# Patient Record
Sex: Male | Born: 1990 | Race: White | Hispanic: No | Marital: Married | Smoking: Former smoker
Health system: Southern US, Community
[De-identification: ages and names within clinical notes are randomized; demographics above are authoritative.]

## PROBLEM LIST (undated history)

## (undated) ENCOUNTER — Emergency Department (HOSPITAL_COMMUNITY): Payer: Commercial Managed Care - PPO | Source: Home / Self Care

## (undated) DIAGNOSIS — Z8719 Personal history of other diseases of the digestive system: Secondary | ICD-10-CM

## (undated) DIAGNOSIS — K219 Gastro-esophageal reflux disease without esophagitis: Secondary | ICD-10-CM

## (undated) DIAGNOSIS — F909 Attention-deficit hyperactivity disorder, unspecified type: Secondary | ICD-10-CM

## (undated) HISTORY — PX: MOUTH SURGERY: SHX715

## (undated) HISTORY — PX: WISDOM TOOTH EXTRACTION: SHX21

---

## 2011-03-07 ENCOUNTER — Ambulatory Visit (INDEPENDENT_AMBULATORY_CARE_PROVIDER_SITE_OTHER): Payer: BC Managed Care – PPO

## 2011-03-07 DIAGNOSIS — J039 Acute tonsillitis, unspecified: Secondary | ICD-10-CM

## 2012-03-11 ENCOUNTER — Telehealth: Payer: Self-pay

## 2012-03-11 NOTE — Telephone Encounter (Signed)
PT'S MOTHER STATES THAT PT IS EXPERIENCING VOMITING AND DIARREAH, PT'S MOTHER WOULD LIKE SOMETHING CALLED IN FOR THESE SYMPTOMS ALTHOUGH HE HASN'T BEEN HERE IN MONTHS.  BEST# [phone] PHARMACY:[facility]

## 2012-03-12 NOTE — Telephone Encounter (Signed)
I am sorry but we can not do this without office visit. Have called mother to advise. Left message.

## 2013-03-18 ENCOUNTER — Ambulatory Visit: Payer: BC Managed Care – PPO | Admitting: Emergency Medicine

## 2013-03-18 VITALS — BP 130/90 | HR 80 | Temp 98.9°F | Resp 18 | Ht 69.0 in | Wt 177.0 lb

## 2013-03-18 DIAGNOSIS — A088 Other specified intestinal infections: Secondary | ICD-10-CM

## 2013-03-18 MED ORDER — ONDANSETRON 8 MG PO TBDP: 8.0000 mg | ORAL_TABLET | Freq: Three times a day (TID) | ORAL | Status: DC | PRN

## 2013-03-18 MED ORDER — LOPERAMIDE HCL 2 MG PO TABS: ORAL_TABLET | ORAL | Status: DC

## 2013-03-18 NOTE — Patient Instructions (Signed)
Clear Liquid Diet The clear liquid diet consists of foods that are liquid or will become liquid at room temperature. Examples of foods allowed on a clear liquid diet include fruit juice, broth or bouillon, gelatin, or frozen ice pops. You should be able to see through the liquid. The purpose of this diet is to provide the necessary fluids, electrolytes (such as sodium and potassium), and energy to keep the body functioning during times when you are not able to consume a regular diet. A clear liquid diet should not be continued for long periods of time, as it is not nutritionally adequate.  A CLEAR LIQUID DIET MAY BE NEEDED:  When a sudden-onset (acute) condition occurs before or after surgery.   As the first step in oral feeding.   For fluid and electrolyte replacement in diarrheal diseases.   As a diet before certain medical tests are performed.  ADEQUACY The clear liquid diet is adequate only in ascorbic acid, according to the Recommended Dietary Allowances of the National Research Council.  CHOOSING FOODS Breads and Starches  Allowed: None are allowed.   Avoid: All are to be avoided.  Vegetables  Allowed: Strained vegetable juices.   Avoid: Any others.  Fruit  Allowed: Strained fruit juices and fruit drinks. Include 1 serving of citrus or vitamin C-enriched fruit juice daily.   Avoid: Any others.  Meat and Meat Substitutes  Allowed: None are allowed.   Avoid: All are to be avoided.  Milk Products  Allowed: None are allowed.   Avoid: All are to be avoided.  Soups and Combination Foods  Allowed: Clear bouillon, broth, or strained broth-based soups.   Avoid: Any others.  Desserts and Sweets  Allowed: Sugar, honey. High-protein gelatin. Flavored gelatin, ices, or frozen ice pops that do not contain milk.   Avoid: Any others.  Fats and Oils  Allowed: None are allowed.   Avoid: All are to be avoided.   Beverages  Allowed: Cereal beverages, coffee (regular or decaffeinated), tea, or soda at the discretion of your health care provider.   Avoid: Any others.  Condiments  Allowed: Salt.   Avoid: Any others, including pepper.  Supplements  Allowed: Liquid nutrition beverages that you can see through.   Avoid: Any others that contain lactose or fiber. SAMPLE MEAL PLAN Breakfast  4 oz (120 mL) strained orange juice.   to 1 cup (120 to 240 mL) gelatin (plain or fortified).  1 cup (240 mL) beverage (coffee or tea).  Sugar, if desired. Midmorning Snack   cup (120 mL) gelatin (plain or fortified). Lunch  1 cup (240 mL) broth or consomm.  4 oz (120 mL) strained grapefruit juice.   cup (120 mL) gelatin (plain or fortified).  1 cup (240 mL) beverage (coffee or tea).  Sugar, if desired. Midafternoon Snack   cup (120 mL) fruit ice.   cup (120 mL) strained fruit juice. Dinner  1 cup (240 mL) broth or consomm.   cup (120 mL) cranberry juice.   cup (120 mL) flavored gelatin (plain or fortified).  1 cup (240 mL) beverage (coffee or tea).  Sugar, if desired. Evening Snack  4 oz (120 mL) strained apple juice (vitamin C-fortified).   cup (120 mL) flavored gelatin (plain or fortified). MAKE SURE YOU:  Understand these instructions.  Will watch your child's condition.  Will get help right away if your child is not doing well or gets worse. Document Released: 03/03/2005 Document Revised: 11/03/2012 Document Reviewed: 08/03/2012 ExitCare Patient Information 2014 ExitCare,   LLC. Viral Gastroenteritis Viral gastroenteritis is also known as stomach flu. This condition affects the stomach and intestinal tract. It can cause sudden diarrhea and vomiting. The illness typically lasts 3 to 8 days. Most people develop an immune response that eventually gets rid of the virus. While this natural response develops, the virus can make you quite ill. CAUSES   Many different viruses can cause gastroenteritis, such as rotavirus or noroviruses. You can catch one of these viruses by consuming contaminated food or water. You may also catch a virus by sharing utensils or other personal items with an infected person or by touching a contaminated surface. SYMPTOMS  The most common symptoms are diarrhea and vomiting. These problems can cause a severe loss of body fluids (dehydration) and a body salt (electrolyte) imbalance. Other symptoms may include:  Fever.  Headache.  Fatigue.  Abdominal pain. DIAGNOSIS  Your caregiver can usually diagnose viral gastroenteritis based on your symptoms and a physical exam. A stool sample may also be taken to test for the presence of viruses or other infections. TREATMENT  This illness typically goes away on its own. Treatments are aimed at rehydration. The most serious cases of viral gastroenteritis involve vomiting so severely that you are not able to keep fluids down. In these cases, fluids must be given through an intravenous line (IV). HOME CARE INSTRUCTIONS   Drink enough fluids to keep your urine clear or pale yellow. Drink small amounts of fluids frequently and increase the amounts as tolerated.  Ask your caregiver for specific rehydration instructions.  Avoid:  Foods high in sugar.  Alcohol.  Carbonated drinks.  Tobacco.  Juice.  Caffeine drinks.  Extremely hot or cold fluids.  Fatty, greasy foods.  Too much intake of anything at one time.  Dairy products until 24 to 48 hours after diarrhea stops.  You may consume probiotics. Probiotics are active cultures of beneficial bacteria. They may lessen the amount and number of diarrheal stools in adults. Probiotics can be found in yogurt with active cultures and in supplements.  Wash your hands well to avoid spreading the virus.  Only take over-the-counter or prescription medicines for pain, discomfort, or fever as directed by your caregiver. Do  not give aspirin to children. Antidiarrheal medicines are not recommended.  Ask your caregiver if you should continue to take your regular prescribed and over-the-counter medicines.  Keep all follow-up appointments as directed by your caregiver. SEEK IMMEDIATE MEDICAL CARE IF:   You are unable to keep fluids down.  You do not urinate at least once every 6 to 8 hours.  You develop shortness of breath.  You notice blood in your stool or vomit. This may look like coffee grounds.  You have abdominal pain that increases or is concentrated in one small area (localized).  You have persistent vomiting or diarrhea.  You have a fever.  The patient is a child younger than 3 months, and he or she has a fever.  The patient is a child older than 3 months, and he or she has a fever and persistent symptoms.  The patient is a child older than 3 months, and he or she has a fever and symptoms suddenly get worse.  The patient is a baby, and he or she has no tears when crying. MAKE SURE YOU:   Understand these instructions.  Will watch your condition.  Will get help right away if you are not doing well or get worse. Document Released: 03/03/2005 Document Revised: 05/26/2011 Document   Reviewed: 12/18/2010 ExitCare Patient Information 2014 ExitCare, LLC.  

## 2013-03-18 NOTE — Progress Notes (Signed)
[facility] [address], [place] [id number] [phone]  Date:  03/18/2013   Name:  Matthew Pace   DOB:  [birth year]   MRN:  [id number]  PCP:  No primary provider on file.    Chief Complaint: Diarrhea, Fatigue and Fever   History of Present Illness:  Matthew Nianndrew Herbel is a 23 y.o. very pleasant male patient who presents with the following:  Three day history of chills, nausea and vomiting, and diarrhea.  Seems to improve today.  Missed one day of work.  Taking liquids.  No food. No food intolerance.  The patient has no complaint of blood, mucous, or pus in her stools. No rash.  No fever or chills.  No ill contacts.  No improvement with over the counter medications or other home remedies. Denies other complaint or health concern today.   There are no active problems to display for this patient.   History reviewed. No pertinent past medical history.  History reviewed. No pertinent past surgical history.  History  Substance Use Topics  . Smoking status: Former Games developermoker  . Smokeless tobacco: Not on file  . Alcohol Use: Not on file    History reviewed. No pertinent family history.  No Known Allergies  Medication list has been reviewed and updated.  No current outpatient prescriptions on file prior to visit.   No current facility-administered medications on file prior to visit.    Review of Systems:  As per HPI, otherwise negative.    Physical Examination: Filed Vitals:   03/18/13 1537  BP: 130/90  Pulse: 80  Temp: 98.9 F (37.2 C)  Resp: 18   Filed Vitals:   03/18/13 1537  Height: 5\' 9"  (1.753 m)  Weight: 177 lb (80.287 kg)   Body mass index is 26.13 kg/(m^2). Ideal Body Weight: Weight in (lb) to have BMI = 25: 168.9  GEN: WDWN, NAD, Non-toxic, A & O x 3.  dry HEENT: Atraumatic, Normocephalic. Neck supple. No masses, No LAD. Ears and Nose: No external deformity. CV: RRR, No M/G/R. No JVD. No thrill. No extra heart sounds. PULM: CTA  B, no wheezes, crackles, rhonchi. No retractions. No resp. distress. No accessory muscle use. ABD: S, NT, ND, +BS. No rebound. No HSM. EXTR: No c/c/e NEURO Normal gait.  PSYCH: Normally interactive. Conversant. Not depressed or anxious appearing.  Calm demeanor.    Assessment and Plan: Gastroenteritis Imodium zofran  Signed,  Phillips OdorJeffery Loma [place], MD

## 2013-03-19 MED ORDER — ONDANSETRON HCL 8 MG PO TABS: 8.0000 mg | ORAL_TABLET | Freq: Three times a day (TID) | ORAL | Status: DC | PRN

## 2013-03-19 NOTE — Addendum Note (Signed)
Addended by: Thelma BargeICHARDSON, Sherilynn Dieu D on: 03/19/2013 02:21 PM   Modules accepted: Orders, Medications

## 2014-10-30 ENCOUNTER — Telehealth: Payer: Self-pay

## 2014-10-30 ENCOUNTER — Ambulatory Visit (INDEPENDENT_AMBULATORY_CARE_PROVIDER_SITE_OTHER): Payer: Worker's Compensation | Admitting: Family Medicine

## 2014-10-30 VITALS — Ht 69.0 in | Wt 187.5 lb

## 2014-10-30 DIAGNOSIS — M79645 Pain in left finger(s): Secondary | ICD-10-CM | POA: Diagnosis not present

## 2014-10-30 DIAGNOSIS — S61219A Laceration without foreign body of unspecified finger without damage to nail, initial encounter: Secondary | ICD-10-CM

## 2014-10-30 NOTE — Progress Notes (Signed)
Procedure: verbal consent obtained. Area anesthetized with 2.5 mL 1% lidocaine. Drape placed and wound irrigated. 5.0 ethylon used to place 3 SI sutures. Dressing applied. Discussed proper wound care with pt. Pt to FU in 10 days for suture removal.

## 2014-10-30 NOTE — Progress Notes (Signed)
   Subjective:    Patient ID: Matthew Pace, male    DOB: 13-Dec-1990, 24 y.o.   MRN: [id number]  HPI  Cut on 2nd left finger after working on engine earlier this evening around 5 pm Last tetanus shot this past November No numbness or tingling Washed out thoroughly with water no soap Did put hand sanitizer and neosporin on the cut Never hurt this finger before Able to bend and extend finger Able to stop bleeding No loss of sensation Denies numbness or tingling  No other associated sx's   Review of Systems    all other pertinent ROS negative except as seen in HPI Objective:   Physical Exam GEN: WDWN, NAD, Non-toxic, A & O x 3 CV: RRR, No M/G/R. No JVD. No thrill. No extra heart sounds. PULM: CTA B, no wheezes, crackles, rhonchi. No retractions. No resp. distress. No accessory muscle use. ABD: S, NT, ND, +BS. No rebound. No HSM. EXTR: No c/c/e, 2 cm laceration over PIP joint of right hand with no extensor tendon involvement, able to fully flex and extend finger , nml sensation NEURO Normal gait.  PSYCH: Normally interactive. Conversant. Not depressed or anxious appearing.  Calm demeanor.    Discussed risks and benefits of procedure and pt agreed to moving forward with suturing Please see PA's note attached     Assessment & Plan:  Finger pain, left  Finger laceration, initial encounter 2cm finger laceration over PIP joint on 2nd left metacarpal No tendon involvement Because area of tension, placed sutures at laceration site RTC in 7 days for suture removal  I examined the patient also and agree with the above.  Peyton Najjar M.D.

## 2014-10-30 NOTE — Progress Notes (Signed)
  Subjective:  Patient ID: Matthew Pace, male    DOB: 02-24-1991  Age: 24 y.o. MRN: [id number]  Patient seen and examined. Laceration of second left finger PIP joint. Less than 2 cm wound.   Objective:    2 cm wound. Just below the joint line. This separates when he flexes the finger.  Assessment & Plan:   Assessment:  Laceration of finger  Plan:  Suture repair  Patient Instructions  WOUND CARE Please return in 10 days to have your stitches/staples removed or sooner if you have concerns. Marland Kitchen Keep area clean and dry for 24 hours. Do not remove bandage, if applied. . After 24 hours, remove bandage and wash wound gently with mild soap and warm water. Reapply a new bandage after cleaning wound, if directed. . Continue daily cleansing with soap and water until stitches/staples are removed. . Do not apply any ointments or creams to the wound while stitches/staples are in place, as this may cause delayed healing. . Notify the office if you experience any of the following signs of infection: Swelling, redness, pus drainage, streaking, fever >101.0 F . Notify the office if you experience excessive bleeding that does not stop after 15-20 minutes of constant, firm pressure.    Forgot to check and see whether the patient has had a Tetanus.  Will call and check.    Cameran Pettey, MD 10/30/2014

## 2014-10-30 NOTE — Patient Instructions (Signed)

## 2014-10-30 NOTE — Telephone Encounter (Signed)
LMOM to CB and let us know his tetanus status.

## 2014-11-01 NOTE — Telephone Encounter (Signed)
Left message for pt to call back  °

## 2014-11-02 NOTE — Telephone Encounter (Signed)
Sent unable to reach letter.

## 2014-11-06 ENCOUNTER — Ambulatory Visit (INDEPENDENT_AMBULATORY_CARE_PROVIDER_SITE_OTHER): Payer: Worker's Compensation | Admitting: Physician Assistant

## 2014-11-06 VITALS — BP 114/78 | HR 70 | Temp 97.9°F | Resp 16 | Ht 69.0 in | Wt 192.2 lb

## 2014-11-06 DIAGNOSIS — L089 Local infection of the skin and subcutaneous tissue, unspecified: Secondary | ICD-10-CM

## 2014-11-06 MED ORDER — MUPIROCIN 2 % EX OINT: 1.0000 "application " | TOPICAL_OINTMENT | Freq: Three times a day (TID) | CUTANEOUS | Status: DC

## 2014-11-06 MED ORDER — DOXYCYCLINE HYCLATE 100 MG PO CAPS: 100.0000 mg | ORAL_CAPSULE | Freq: Two times a day (BID) | ORAL | Status: AC

## 2014-11-06 NOTE — Progress Notes (Signed)
Matthew Pace 1990-08-21 23 y.o.   Chief Complaint  Patient presents with  . Follow-up    workers comp injury, finger where sutures are, he thinks its getting infected     Date of Injury: 10/30/2014  History of Present Illness:  Presents for evaluation of work-related complaint.  Patient is here today for follow up of laceration, status suture repair 7 days ago, with concern of infection.  Patient received this laceration as he was in a rat's nest found in a vehicle, and cut his hand on a piece of metal at the nest.  He was given stitches.  He reports that 2 days ago, his bandaging fell off, and he had the wound dirty.  He cleaned it with soap and water, but has noticed that he started to have increased redness at the laceration site, soreness, and drainage.  He has no fever, numbness, or tingling.  He has applied triple aid ointment to the laceration daily.  Non-dominant hand.  Works in [employer].  ROS ROS otherwise unremarkable unless listed above.    No Known Allergies   Current medications reviewed and updated. Past medical history, family history, social history have been reviewed and updated.   Physical Exam  Constitutional: He is oriented to person, place, and time and well-developed, well-nourished, and in no distress. No distress.  Cardiovascular: Normal rate.   Pulmonary/Chest: Effort normal. No respiratory distress.  Neurological: He is alert and oriented to person, place, and time.  Skin: Skin is warm and dry. He is not diaphoretic.  Left 2nd finger at dorsum DIP with 2/3 sutures intact.  Localized redness with mild tenderness present at surrounding suture site.  Horizontal laceration which opens with flexion.  No exudate present.  Radial pulse, and capillary refill normal.    Psychiatric: Affect and judgment normal.     Assessment and Plan: 24 year old male is here today for follow up of finger laceration.  Sutures removed without complication.  Mupirocin  placed bandaging, and coban to restrict movement.  He will return in 4 days.  Restriction of flexing this finger given.  Due to this hand joint, exudate, and nature of injury, I am issuing doxycycline at this time.  TDAP last year given with previous WC laceration.    Finger infection - Plan: mupirocin ointment (BACTROBAN) 2 %, doxycycline (VIBRAMYCIN) 100 MG capsule  Trena Platt, PA-C [facility] 8/22/20168:48 PM

## 2014-11-06 NOTE — Patient Instructions (Signed)

## 2015-01-08 ENCOUNTER — Ambulatory Visit (INDEPENDENT_AMBULATORY_CARE_PROVIDER_SITE_OTHER): Payer: BLUE CROSS/BLUE SHIELD | Admitting: Physician Assistant

## 2015-01-08 VITALS — BP 132/78 | HR 70 | Temp 98.3°F | Resp 16 | Ht 69.0 in | Wt 201.0 lb

## 2015-01-08 DIAGNOSIS — M545 Low back pain, unspecified: Secondary | ICD-10-CM

## 2015-01-08 MED ORDER — CYCLOBENZAPRINE HCL 10 MG PO TABS: 10.0000 mg | ORAL_TABLET | Freq: Every day | ORAL | Status: DC

## 2015-01-08 MED ORDER — DICLOFENAC SODIUM 75 MG PO TBEC: 75.0000 mg | DELAYED_RELEASE_TABLET | Freq: Two times a day (BID) | ORAL | Status: DC

## 2015-01-08 MED ORDER — TRAMADOL HCL 50 MG PO TABS: 50.0000 mg | ORAL_TABLET | Freq: Three times a day (TID) | ORAL | Status: DC | PRN

## 2015-01-08 NOTE — Progress Notes (Signed)
   Subjective:    Patient ID: Matthew Pace, male    DOB: [birth year], 24 y.o.   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 Does not feel pain when swinging, but feels it each time was picking up puck. Pain felt on both side of low back and does not radiate. Pain is 8/10 on pain scale and worst when he does any kind of bending or twisting. Denies numbness, tingling, weakness, or loss sensation/ROM/fxn of back or lower extremities. Has had pain in this location in the back before and typically has flared in relation to hockey, however, hurt back originally in high school when lifting weights. Has not tried any medications to remedy at home. NKDA. 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    Review of Systems As noted above.    Objective:   Physical Exam  Constitutional: He is oriented to person, place, and time. He appears well-developed and well-nourished. No distress.  Blood pressure 132/78, pulse 70, temperature 98.3 F (36.8 C), temperature source Oral, resp. rate 16, height 5\' 9"  (1.753 m), weight 201 lb (91.173 kg), SpO2 99 %.  HENT:  Head: Normocephalic and atraumatic.  Right Ear: External ear normal.  Left Ear: External ear normal.  Eyes: Conjunctivae and EOM are normal. Pupils are equal, round, and reactive to light. Right eye exhibits no discharge. Left eye exhibits no discharge. No scleral icterus.  Neck: Normal range of motion. Neck supple.  Pulmonary/Chest: Effort normal.  Musculoskeletal: Normal range of motion. He exhibits no edema or tenderness.       Cervical back: Normal.       Thoracic back: Normal.       Lumbar back: He exhibits pain and spasm. He exhibits normal range of motion, no tenderness, no bony tenderness, no swelling, no edema, no deformity, no laceration and normal pulse.       Right upper leg: Normal.       Left upper leg: Normal.  Lymphadenopathy:    He has no cervical  adenopathy.  Neurological: He is alert and oriented to person, place, and time. He has normal reflexes. No cranial nerve deficit. He exhibits normal muscle tone. Coordination normal.  Skin: Skin is warm and dry. No rash noted. He is not diaphoretic. No erythema. No pallor.  Psychiatric: He has a normal mood and affect. His behavior is normal. Judgment and thought content normal.       Assessment & Plan:  1. Midline low back pain without sciatica Back exercises given. Can alternate heat and ice after exercises.  - diclofenac (VOLTAREN) 75 MG EC tablet; Take 1 tablet (75 mg total) by mouth 2 (two) times daily.  Dispense: 30 tablet; Refill: 0 - cyclobenzaprine (FLEXERIL) 10 MG tablet; Take 1 tablet (10 mg total) by mouth at bedtime.  Dispense: 15 tablet; Refill: 0 - traMADol (ULTRAM) 50 MG tablet; Take 1 tablet (50 mg total) by mouth every 8 (eight) hours as needed.  Dispense: 30 tablet; Refill: 0   Vernie Piet PA-C  [facility] 01/08/2015 4:19 PM

## 2015-01-08 NOTE — Patient Instructions (Signed)

## 2016-04-17 ENCOUNTER — Ambulatory Visit (INDEPENDENT_AMBULATORY_CARE_PROVIDER_SITE_OTHER): Payer: 59 | Admitting: Family Medicine

## 2016-04-17 VITALS — BP 130/80 | HR 69 | Temp 98.3°F | Resp 18 | Ht 69.0 in | Wt 220.0 lb

## 2016-04-17 DIAGNOSIS — L0291 Cutaneous abscess, unspecified: Secondary | ICD-10-CM

## 2016-04-17 DIAGNOSIS — Z23 Encounter for immunization: Secondary | ICD-10-CM

## 2016-04-17 MED ORDER — DOXYCYCLINE HYCLATE 100 MG PO CAPS: 100.0000 mg | ORAL_CAPSULE | Freq: Two times a day (BID) | ORAL | 0 refills | Status: DC

## 2016-04-17 NOTE — Patient Instructions (Addendum)
Start Doxycyline 100 mg twice daily x 10 days with food. Complete all medication.  Return on Saturday for wound care with Whitney McVey PA-C  WOUND CARE Please return in 3 days to have your wound packing  removed or sooner if you have concerns. Marland Kitchen Keep area clean and dry for 24 hours. Do not remove bandage, if applied. . After 24 hours, remove bandage and wash wound gently with mild soap and warm water. Reapply a new bandage after cleaning wound, if directed. . Continue daily cleansing with soap and water until wound heals . Do not apply any ointments or creams t. Notify the office if you experience any of the following signs of infection: Swelling, redness, pus drainage, streaking, fever >101.0 F . Notify the office if you experience excessive bleeding that does not stop after 15-20 minutes of constant, firm pressure.   IF you received an x-ray today, you will receive an invoice from [facility]. Please contact [facility] at [phone] with questions or concerns regarding your invoice.   IF you received labwork today, you will receive an invoice from [facility]. Please contact [facility] at [phone] with questions or concerns regarding your invoice.   Our billing staff will not be able to assist you with questions regarding bills from these companies.  You will be contacted with the lab results as soon as they are available. The fastest way to get your results is to activate your My Chart account. Instructions are located on the last page of this paperwork. If you have not heard from [place] regarding the results in 2 weeks, please contact this office.     Cellulitis, Adult Introduction Cellulitis is a skin infection. The infected area is usually red and sore. This condition occurs most often in the arms and lower legs. It is very important to get treated for this condition. Follow these instructions at home:  Take over-the-counter and prescription medicines only  as told by your doctor.  If you were prescribed an antibiotic medicine, take it as told by your doctor. Do not stop taking the antibiotic even if you start to feel better.  Drink enough fluid to keep your pee (urine) clear or pale yellow.  Do not touch or rub the infected area.  Raise (elevate) the infected area above the level of your heart while you are sitting or lying down.  Place warm or cold wet cloths (warm or cold compresses) on the infected area. Do this as told by your doctor.  Keep all follow-up visits as told by your doctor. This is important. These visits let your doctor make sure your infection is not getting worse. Contact a doctor if:  You have a fever.  Your symptoms do not get better after 1-2 days of treatment.  Your bone or joint under the infected area starts to hurt after the skin has healed.  Your infection comes back. This can happen in the same area or another area.  You have a swollen bump in the infected area.  You have new symptoms.  You feel ill and also have muscle aches and pains. Get help right away if:  Your symptoms get worse.  You feel very sleepy.  You throw up (vomit) or have watery poop (diarrhea) for a long time.  There are red streaks coming from the infected area.  Your red area gets larger.  Your red area turns darker. This information is not intended to replace advice given to you by your health care provider. Make  sure you discuss any questions you have with your health care provider. Document Released: 08/20/2007 Document Revised: 08/09/2015 Document Reviewed: 01/10/2015  2017 Elsevier

## 2016-04-17 NOTE — Progress Notes (Deleted)
Procedure Encounter for I & D Only  Noticed an increased mass in center of back x 1 week.       Today's Vitals   04/17/16 1441  BP: 130/80  Pulse: 69  Resp: 18  Temp: 98.3 F (36.8 C)  TempSrc: Oral  SpO2: 99%  Weight: 220 lb (99.8 kg)  Height: 5\' 9"  (1.753 m)

## 2016-04-19 ENCOUNTER — Ambulatory Visit (INDEPENDENT_AMBULATORY_CARE_PROVIDER_SITE_OTHER): Payer: 59 | Admitting: Physician Assistant

## 2016-04-19 VITALS — BP 136/78 | HR 78 | Temp 98.2°F | Resp 16 | Ht 70.0 in | Wt 222.0 lb

## 2016-04-19 DIAGNOSIS — L723 Sebaceous cyst: Secondary | ICD-10-CM

## 2016-04-19 DIAGNOSIS — L089 Local infection of the skin and subcutaneous tissue, unspecified: Secondary | ICD-10-CM

## 2016-04-19 NOTE — Progress Notes (Signed)
   Matthew Pace  MRN: [id number] DOB: [birth year]  PCP: Leanor RubensteinSUN,VYVYAN Y, MD  Subjective:  Pt is a 26 year old male who presents to clinic for f/u wound care. He was here 2/01 for mas on back. I& D performed, wound was packed. Pt started on Doxycycline. Feeling well today. Pain is decreasing. No side effects from medication.  + drainage from wound. Packing came out yesterday with dressing change.  Denies fever, chills, nausea, vomiting, increased redness or swelling.  Review of Systems  Constitutional: Negative for chills, diaphoresis and fever.  Respiratory: Negative for cough, chest tightness, shortness of breath and wheezing.   Cardiovascular: Negative for chest pain, palpitations and leg swelling.  Gastrointestinal: Negative for diarrhea, nausea and vomiting.  Skin: Positive for wound.  Neurological: Negative for dizziness, syncope, light-headedness and headaches.  Psychiatric/Behavioral: Negative for sleep disturbance. The patient is not nervous/anxious.     There are no active problems to display for this patient.   Current Outpatient Prescriptions on File Prior to Visit  Medication Sig Dispense Refill  . doxycycline (VIBRAMYCIN) 100 MG capsule Take 1 capsule (100 mg total) by mouth 2 (two) times daily. 20 capsule 0   No current facility-administered medications on file prior to visit.     No Known Allergies   Objective:  BP 136/78   Pulse 78   Temp 98.2 F (36.8 C)   Resp 16   Ht 5\' 10"  (1.778 m)   Wt 222 lb (100.7 kg)   SpO2 98%   BMI 31.85 kg/m   Physical Exam  Constitutional: He is oriented to person, place, and time and well-developed, well-nourished, and in no distress. No distress.  Cardiovascular: Normal rate, regular rhythm and normal heart sounds.   Neurological: He is alert and oriented to person, place, and time. GCS score is 15.  Skin: Skin is warm and dry.     Psychiatric: Mood, memory, affect and judgment normal.  Vitals reviewed.  Procedure:  Verbal consent obtained. Wound irrigated with 10cc Lidocaine. Cystic material expressed with deep palpation. Wound explored with hemostats. No purulence identified. Lightly packed with 1/4 in gauze.  Wound dressed and wound care discussed.  Assessment and Plan :  1. Infected sebaceous cyst - Wound lightly packed with 1/4 in packing, wound care discussed. Pt still taking antibiotics. Wound Culture reviewed and is negative, will have pt continue course. RTC in 48 hours for wound care.    Marco CollieWhitney Neetu Carrozza, PA-C  [facility] 04/19/2016 12:58 PM

## 2016-04-20 LAB — WOUND CULTURE: ORGANISM ID, BACTERIA: NONE SEEN

## 2016-04-20 NOTE — Progress Notes (Signed)
Patient ID: Matthew Pace, male    DOB: [birth year], 26 y.o.   MRN: [id number]  PCP: Leanor RubensteinSUN,VYVYAN Y, MD  Chief Complaint  Patient presents with  . Cyst    Center of back  . Flu Vaccine    Subjective:  HPI  26 year old male presents for evaluation of cyst in the center of back. Noticed an increased mass on the center of back x 1 week. Mass started as a small red "bump" that has gradually increased in diameter. Pt denies fever or nausea over the last week. He hasn't applied any ointment to mass.    Social History   Social History  . Marital status: Single    Spouse name: N/A  . Number of children: N/A  . Years of education: N/A   Occupational History  . Not on file.   Social History Main Topics  . Smoking status: Former Smoker    Packs/day: 1.00    Types: Cigarettes    Quit date: 12/31/2015  . Smokeless tobacco: Never Used  . Alcohol use 4.2 oz/week    7 Standard drinks or equivalent per week  . Drug use: No  . Sexual activity: Not on file   Other Topics Concern  . Not on file   Social History Narrative  . No narrative on file    Family History  Problem Relation Age of Onset  . Diabetes Mother   . Hypertension Mother   . Diabetes Maternal Grandmother   . Stroke Maternal Grandmother   . Cancer Maternal Grandfather   . Heart disease Paternal Grandfather   . Stroke Paternal Grandfather    Review of Systems  See HPI   Prior to Admission medications   Medication Sig Start Date End Date Taking? Authorizing Provider  doxycycline (VIBRAMYCIN) 100 MG capsule Take 1 capsule (100 mg total) by mouth 2 (two) times daily. 04/17/16   Doyle AskewKimberly Stephenia Caryssa Elzey, FNP    Past Medical, Surgical Family and Social History reviewed and updated.    Objective:   Today's Vitals   04/17/16 1441  BP: 130/80  Pulse: 69  Resp: 18  Temp: 98.3 F (36.8 C)  TempSrc: Oral  SpO2: 99%  Weight: 220 lb (99.8 kg)  Height: 5\' 9"  (1.753 m)    Wt Readings from Last 3  Encounters:  04/19/16 222 lb (100.7 kg)  04/17/16 220 lb (99.8 kg)  01/08/15 201 lb (91.2 kg)   Physical Exam  Constitutional: He is oriented to person, place, and time. He appears well-developed and well-nourished.  HENT:  Head: Normocephalic and atraumatic.  Eyes: Pupils are equal, round, and reactive to light.  Neck: Normal range of motion. Neck supple.  Cardiovascular: Normal rate.   Pulmonary/Chest: Effort normal.  Lymphadenopathy:    He has no cervical adenopathy.  Neurological: He is alert and oriented to person, place, and time.  Skin: Skin is warm. There is erythema.  Rounded mass situated in the center of mid back, fluctuant, erythematous base, abscess   Psychiatric: He has a normal mood and affect. His behavior is normal. Judgment and thought content normal.   Procedure Note: Drained Wound Abscess-Verbal informed consent obtained.   Cleaned with alcohol swab. Local anesthesia 5 cc of 1% lidocaine with epinephrine injected directly into the abscess. Prepped with betadine. Incised with # 11. Large amount of purulent exudate mixed with serosanguineous debris was extracted. Wound packed.    Assessment & Plan:  1. Abscess - WOUND CULTURE 2. Needs flu shot - Flu  Vaccine QUAD 36+ mos IM  Plan: -Patient tolerate procedure. -Doxycycline 100 mg twice daily x 10 days. Complete all medication. -Return on Saturday for a wound care follow-up.  Godfrey Pick. Tiburcio Pea, MSN, FNP-C Primary Care at [facility] [phone]

## 2016-04-21 ENCOUNTER — Ambulatory Visit (INDEPENDENT_AMBULATORY_CARE_PROVIDER_SITE_OTHER): Payer: 59 | Admitting: Physician Assistant

## 2016-04-21 VITALS — BP 132/76 | HR 73 | Temp 98.5°F | Resp 18 | Ht 70.0 in | Wt 217.0 lb

## 2016-04-21 DIAGNOSIS — Z5189 Encounter for other specified aftercare: Secondary | ICD-10-CM

## 2016-04-21 DIAGNOSIS — L089 Local infection of the skin and subcutaneous tissue, unspecified: Secondary | ICD-10-CM

## 2016-04-21 DIAGNOSIS — L723 Sebaceous cyst: Secondary | ICD-10-CM

## 2016-04-21 NOTE — Patient Instructions (Signed)
     IF you received an x-ray today, you will receive an invoice from [facility]. Please contact [facility] at [phone] with questions or concerns regarding your invoice.   IF you received labwork today, you will receive an invoice from [facility]. Please contact [facility] at [phone] with questions or concerns regarding your invoice.   Our billing staff will not be able to assist you with questions regarding bills from these companies.  You will be contacted with the lab results as soon as they are available. The fastest way to get your results is to activate your My Chart account. Instructions are located on the last page of this paperwork. If you have not heard from us regarding the results in 2 weeks, please contact this office.     

## 2016-04-21 NOTE — Progress Notes (Signed)
    MRN: [id number] DOB: 12-21-1990  Subjective:   Matthew Pace is a 26 y.o. male presenting for follow up on infected sebaceous cyst. He was here originally 2/1 for mass on his back and received I&D, wound was packed and he was started on Doxy.  Today he is still on Doxy, feeling well. Pain is improving. C/o blood drainage from wound yesterday. Keeping wound clean, dry and dressed.  Denies fever, chills, nausea, vomiting, increased redness, swelling, tenderness  Matthew Pace has a current medication list which includes the following prescription(s): doxycycline. Also has No Known Allergies.  Matthew Pace  has no past medical history on file. Also  has no past surgical history on file.   Objective:   Vitals: BP 132/76   Pulse 73   Temp 98.5 F (36.9 C) (Oral)   Resp 18   Ht 5\' 10"  (1.778 m)   Wt 217 lb (98.4 kg)   SpO2 100%   BMI 31.14 kg/m   Physical Exam  Constitutional: He is oriented to person, place, and time. He appears well-developed and well-nourished.  Cardiovascular: Normal rate and regular rhythm.   Pulmonary/Chest: Effort normal. No respiratory distress.  Neurological: He is alert and oriented to person, place, and time.  Skin: Skin is warm and dry.  Psychiatric: He has a normal mood and affect. His behavior is normal. Judgment and thought content normal.  Vitals reviewed.   No results found for this or any previous visit (from the past 24 hour(s)).  Procedure: Verbal consent obtained. Packing still in place. Packing removed, sebaceous materials expressed with deep palpation. Beefy red granulation tissue present. Wound irrigated with 5cc lidocaine. Wound cavity size approx 1 cm superiorly. Wound was packed with 1/4 inch packing. Wound dressed and wound care discussed.  Assessment and Plan :  1. Encounter for wound care 2. Infected sebaceous cyst - Wound is healing well. Repacked with 1/4 in packing and dressed. Continue Doxy. RTC in 48 hours for wound care.    Marco CollieWhitney  Arhianna Ebey, PA-C  [facility] at [facility] 04/21/2016 1:59 PM

## 2016-04-22 ENCOUNTER — Ambulatory Visit: Payer: Self-pay

## 2016-04-24 ENCOUNTER — Ambulatory Visit (INDEPENDENT_AMBULATORY_CARE_PROVIDER_SITE_OTHER): Payer: 59 | Admitting: Physician Assistant

## 2016-04-24 VITALS — BP 134/72 | HR 81 | Temp 98.0°F | Ht 70.0 in | Wt 217.0 lb

## 2016-04-24 DIAGNOSIS — L089 Local infection of the skin and subcutaneous tissue, unspecified: Secondary | ICD-10-CM

## 2016-04-24 DIAGNOSIS — L723 Sebaceous cyst: Secondary | ICD-10-CM

## 2016-04-24 DIAGNOSIS — Z5189 Encounter for other specified aftercare: Secondary | ICD-10-CM

## 2016-04-24 NOTE — Progress Notes (Signed)
    MRN: [id number] DOB: 1990-07-24  Subjective:   Matthew Pace is a 26 y.o. male presenting for follow up on wound care.  His original OV was 2/1 for infected sebaceous cyst, had I&D which was packed and was started on Doxy.  This is his third f/u Ov. He is feeling well today, still taking medication. No side effects. Keeping wound clean and dry.  Denies drainage, increased swelling, pain, fever, chills.    Matthew Pace has a current medication list which includes the following prescription(s): doxycycline. Also has No Known Allergies.  Matthew Pace  has no past medical history on file. Also  has no past surgical history on file.   Objective:   Vitals: BP 134/72   Pulse 81   Temp 98 F (36.7 C) (Oral)   Ht 5\' 10"  (1.778 m)   Wt 217 lb (98.4 kg)   SpO2 99%   BMI 31.14 kg/m   Physical Exam  Constitutional: He is oriented to person, place, and time. He appears well-developed and well-nourished.  Cardiovascular: Normal rate and regular rhythm.   Pulmonary/Chest: Effort normal. No respiratory distress.  Neurological: He is alert and oriented to person, place, and time.  Skin: Skin is warm and dry.     Psychiatric: He has a normal mood and affect. His behavior is normal. Judgment and thought content normal.  Vitals reviewed.  Procedure: Verbal consent obtained. Wound was rrigated w 5cc Lidocaine. No purulent drainage or cystic material expressed with deep palpation. Wound was explored and found to be about 1cm superiorly. Lightly repacked w 1/4 in packing.  Wound dressed and wound care discussed.  No results found for this or any previous visit (from the past 24 hour(s)).  Assessment and Plan :  1. Encounter for wound care 2. Infected sebaceous cyst - Wound is healing well. Wound was irrigated, lightly repacked with 1/4 in packing and dressed. RTC in 48 hours for wound care.   Marco CollieWhitney Sham Alviar, PA-C  [facility] at [facility] 04/24/2016 4:52 PM

## 2016-04-26 ENCOUNTER — Ambulatory Visit (INDEPENDENT_AMBULATORY_CARE_PROVIDER_SITE_OTHER): Payer: 59 | Admitting: Physician Assistant

## 2016-04-26 VITALS — BP 122/72 | HR 76 | Temp 97.5°F | Resp 17 | Ht 69.5 in | Wt 218.0 lb

## 2016-04-26 DIAGNOSIS — Z5189 Encounter for other specified aftercare: Secondary | ICD-10-CM

## 2016-04-26 NOTE — Patient Instructions (Addendum)
Complete antibiotic course. Continue keeping wound covered. Change dressing if it becomes soaked. Follow up in 2-3 days for packing reevaluation. Thank you for letting me participate in your health and well being.     IF you received an x-ray today, you will receive an invoice from [facility]. Please contact [facility] at [phone] with questions or concerns regarding your invoice.   IF you received labwork today, you will receive an invoice from [facility]. Please contact [facility] at [phone] with questions or concerns regarding your invoice.   Our billing staff will not be able to assist you with questions regarding bills from these companies.  You will be contacted with the lab results as soon as they are available. The fastest way to get your results is to activate your My Chart account. Instructions are located on the last page of this paperwork. If you have not heard from us regarding the results in 2 weeks, please contact this office.

## 2016-04-26 NOTE — Progress Notes (Signed)
    MRN: [id number] DOB: 04-23-1990  Subjective:   Matthew Pace is a 26 y.o. male presenting for follow up on wound care. His original OV was 2/1 for infected sebaceous cyst. I&D performed, packing placed and pt was started on Doxy. This is his fourth follow up office visit. He is feeling well today, still taking medication. No side effects. He is keeping wound clean and dry. Denies drainage, increased swelling, pain, fever, and chills.   Matthew Pace has a current medication list which includes the following prescription(s): doxycycline. Also has No Known Allergies.  Matthew Pace  has no past medical history on file. Also  has no past surgical history on file.   Objective:   Vitals: BP 122/72 (BP Location: Right Arm, Patient Position: Sitting, Cuff Size: Normal)   Pulse 76   Temp 97.5 F (36.4 C) (Oral)   Resp 17   Ht 5' 9.5" (1.765 m)   Wt 218 lb (98.9 kg)   SpO2 97%   BMI 31.73 kg/m   Physical Exam  Constitutional: He is oriented to person, place, and time. He appears well-developed and well-nourished.  HENT:  Head: Normocephalic and atraumatic.  Eyes: Conjunctivae are normal.  Neck: Normal range of motion.  Pulmonary/Chest: Effort normal.  Neurological: He is alert and oriented to person, place, and time.  Skin: Skin is warm and dry.     Psychiatric: He has a normal mood and affect.  Vitals reviewed.  Procedure: Packing removed. Wound cavity cleansed with 10cc lidocaine 2% without epinephrine. Wound cavity depth measured with forceps, ~1cm in depth. Repacked lightly with 0.25" packing. Dressing applied.   No results found for this or any previous visit (from the past 24 hour(s)).  Assessment and Plan :  1. Encounter for wound care -Complete antibiotic course. Wound care instructions given. Follow up in 2-3 days for packing reevaluation.   Benjiman CoreBrittany Javona Bergevin, PA-C  [facility] 04/26/2016 10:07 AM

## 2016-04-29 ENCOUNTER — Ambulatory Visit (INDEPENDENT_AMBULATORY_CARE_PROVIDER_SITE_OTHER): Payer: 59 | Admitting: Urgent Care

## 2016-04-29 VITALS — BP 124/80 | HR 85 | Temp 98.4°F | Resp 18 | Ht 69.5 in | Wt 219.0 lb

## 2016-04-29 DIAGNOSIS — L089 Local infection of the skin and subcutaneous tissue, unspecified: Secondary | ICD-10-CM

## 2016-04-29 DIAGNOSIS — Z5189 Encounter for other specified aftercare: Secondary | ICD-10-CM

## 2016-04-29 DIAGNOSIS — L723 Sebaceous cyst: Secondary | ICD-10-CM

## 2016-04-29 NOTE — Progress Notes (Signed)
    MRN: [id number] DOB: 1990/04/17  Subjective:   Matthew Pace is a [age] y.o. male presenting for follow up on wound care for infected sebaceous cyst. Patient is doing dressing changes with help of family members. He is almost finished with doxycycline. Reports that he feels significantly improved. Denies fever, redness, pain, drainage of pus or bleeding.   Matthew Pace has a current medication list which includes the following prescription(s): doxycycline, moxifloxacin, and prednisolone acetate. Also has No Known Allergies.  Matthew Pace  has no past medical history on file. Also  has no past surgical history on file.  Objective:   Vitals: BP 124/80   Pulse 85   Temp 98.4 F (36.9 C) (Oral)   Resp 18   Ht 5' 9.5" (1.765 m)   Wt 219 lb (99.3 kg)   SpO2 98%   BMI 31.88 kg/m   Physical Exam  Constitutional: He is oriented to person, place, and time. He appears well-developed and well-nourished.  Cardiovascular: Normal rate.   Pulmonary/Chest: Effort normal.  Neurological: He is alert and oriented to person, place, and time.   WOUND CARE - Dressing removed. Packing in place. There is mild residual sebaceous material, <1cc expressed. No tenderness, fluctuance, induration or erythema noted over back and wound. Wound cleansed and dressed without additional packing.  Assessment and Plan :   1. Encounter for wound care 2. Infected sebaceous cyst - Healing well. Continue dressing changes at home. Anticipatory guidance provided. RTC if symptoms fail to resolve.  Wallis BambergMario Murel Shenberger, PA-C [facility] [phone] 04/29/2016 5:05 PM

## 2017-02-17 ENCOUNTER — Ambulatory Visit: Payer: 59 | Admitting: Physician Assistant

## 2017-02-17 ENCOUNTER — Other Ambulatory Visit: Payer: Self-pay

## 2017-02-17 ENCOUNTER — Encounter: Payer: Self-pay | Admitting: Physician Assistant

## 2017-02-17 VITALS — BP 112/76 | HR 86 | Temp 98.5°F | Resp 16 | Ht 69.0 in | Wt 216.2 lb

## 2017-02-17 DIAGNOSIS — K649 Unspecified hemorrhoids: Secondary | ICD-10-CM

## 2017-02-17 NOTE — Patient Instructions (Addendum)
See below for hemorrhoid info and how to take a sitz bath.  Try to improve your diet.  Look up [employer].  Come back if you're not better.   Thank you for coming in today. I hope you feel we met your needs.  Feel free to call PCP if you have any questions or further requests.  Please consider signing up for MyChart if you do not already have it, as this is a great way to communicate with me.  Best,  Whitney McVey, PA-C   Hemorrhoids Hemorrhoids are swollen veins in and around the rectum or anus. There are two types of hemorrhoids:  Internal hemorrhoids. These occur in the veins that are just inside the rectum. They may poke through to the outside and become irritated and painful.  External hemorrhoids. These occur in the veins that are outside of the anus and can be felt as a painful swelling or hard lump near the anus.  Most hemorrhoids do not cause serious problems, and they can be managed with home treatments such as diet and lifestyle changes. If home treatments do not help your symptoms, procedures can be done to shrink or remove the hemorrhoids. What are the causes? This condition is caused by increased pressure in the anal area. This pressure may result from various things, including:  Constipation.  Straining to have a bowel movement.  Diarrhea.  Pregnancy.  Obesity.  Sitting for long periods of time.  Heavy lifting or other activity that causes you to strain.  Anal sex.  What are the signs or symptoms? Symptoms of this condition include:  Pain.  Anal itching or irritation.  Rectal bleeding.  Leakage of stool (feces).  Anal swelling.  One or more lumps around the anus.  How is this diagnosed? This condition can often be diagnosed through a visual exam. Other exams or tests may also be done, such as:  Examination of the rectal area with a gloved hand (digital rectal exam).  Examination of the anal canal using a small tube (anoscope).  A blood  test, if you have lost a significant amount of blood.  A test to look inside the colon (sigmoidoscopy or colonoscopy).  How is this treated? This condition can usually be treated at home. However, various procedures may be done if dietary changes, lifestyle changes, and other home treatments do not help your symptoms. These procedures can help make the hemorrhoids smaller or remove them completely. Some of these procedures involve surgery, and others do not. Common procedures include:  Rubber band ligation. Rubber bands are placed at the base of the hemorrhoids to cut off the blood supply to them.  Sclerotherapy. Medicine is injected into the hemorrhoids to shrink them.  Infrared coagulation. A type of light energy is used to get rid of the hemorrhoids.  Hemorrhoidectomy surgery. The hemorrhoids are surgically removed, and the veins that supply them are tied off.  Stapled hemorrhoidopexy surgery. A circular stapling device is used to remove the hemorrhoids and use staples to cut off the blood supply to them.  Follow these instructions at home: Eating and drinking  Eat foods that have a lot of fiber in them, such as whole grains, beans, nuts, fruits, and vegetables. Ask your health care provider about taking products that have added fiber (fiber supplements).  Drink enough fluid to keep your urine clear or pale yellow. Managing pain and swelling  Take warm sitz baths for 20 minutes, 3-4 times a day to ease pain and discomfort.  If directed, apply ice to the affected area. Using ice packs between sitz baths may be helpful. ? Put ice in a plastic bag. ? Place a towel between your skin and the bag. ? Leave the ice on for 20 minutes, 2-3 times a day. General instructions  Take over-the-counter and prescription medicines only as told by your health care provider.  Use medicated creams or suppositories as told.  Exercise regularly.  Go to the bathroom when you have the urge to have a  bowel movement. Do not wait.  Avoid straining to have bowel movements.  Keep the anal area dry and clean. Use wet toilet paper or moist towelettes after a bowel movement.  Do not sit on the toilet for long periods of time. This increases blood pooling and pain. Contact a health care provider if:  You have increasing pain and swelling that are not controlled by treatment or medicine.  You have uncontrolled bleeding.  You have difficulty having a bowel movement, or you are unable to have a bowel movement.  You have pain or inflammation outside the area of the hemorrhoids. This information is not intended to replace advice given to you by your health care provider. Make sure you discuss any questions you have with your health care provider. Document Released: 02/29/2000 Document Revised: 08/01/2015 Document Reviewed: 11/15/2014 Elsevier Interactive Patient Education  2017 [employer].  How to Take a [employer] A sitz bath is a warm water bath that is taken while you are sitting down. The water should only come up to your hips and should cover your buttocks. Your health care provider may recommend a sitz bath to help you:  Clean the lower part of your body, including your genital area.  With itching.  With pain.  With sore muscles or muscles that tighten or spasm.  How to take a sitz bath Take 3-4 sitz baths per day or as told by your health care provider. 1. Partially fill a bathtub with warm water. You will only need the water to be deep enough to cover your hips and buttocks when you are sitting in it. 2. If your health care provider told you to put medicine in the water, follow the directions exactly. 3. Sit in the water and open the tub drain a little. 4. Turn on the warm water again to keep the tub at the correct level. Keep the water running constantly. 5. Soak in the water for 15-20 minutes or as told by your health care provider. 6. After the sitz bath, pat the affected  area dry first. Do not rub it. 7. Be careful when you stand up after the sitz bath because you may feel dizzy.  Contact a health care provider if:  Your symptoms get worse. Do not continue with sitz baths if your symptoms get worse.  You have new symptoms. Do not continue with sitz baths until you talk with your health care provider. This information is not intended to replace advice given to you by your health care provider. Make sure you discuss any questions you have with your health care provider. Document Released: 11/24/2003 Document Revised: 08/01/2015 Document Reviewed: 03/01/2014 Elsevier Interactive Patient Education  2018 [employer].   IF you received an x-ray today, you will receive an invoice from [facility]. Please contact [facility] at [phone] with questions or concerns regarding your invoice.   IF you received labwork today, you will receive an invoice from [place]. Please contact [facility] at [phone] with  questions or concerns regarding your invoice.   Our billing staff will not be able to assist you with questions regarding bills from these companies.  You will be contacted with the lab results as soon as they are available. The fastest way to get your results is to activate your My Chart account. Instructions are located on the last page of this paperwork. If you have not heard from [place] regarding the results in 2 weeks, please contact this office.

## 2017-02-17 NOTE — Progress Notes (Signed)
   Matthew Pace  MRN: [id number] DOB: 1990/12/05  PCP: Deatra JamesSun, Vyvyan, MD  Subjective:  Pt is a 26 year old male who presents to clinic for blood in stool x a few days. Happens after bowel movement. He saw a dark blood clot a few hours ago. Denies constipation. Eats healthy diet and exercises regularly.   Review of Systems  Constitutional: Negative for chills, diaphoresis, fatigue and fever.  Respiratory: Negative for cough and shortness of breath.   Cardiovascular: Negative for chest pain and palpitations.  Gastrointestinal: Positive for blood in stool. Negative for abdominal pain, constipation, diarrhea, nausea and vomiting.    There are no active problems to display for this patient.   No current outpatient medications on file prior to visit.   No current facility-administered medications on file prior to visit.     No Known Allergies   Objective:  BP 112/76   Pulse 86   Temp 98.5 F (36.9 C) (Oral)   Resp 16   Ht 5\' 9"  (1.753 m)   Wt 216 lb 3.2 oz (98.1 kg)   SpO2 96%   BMI 31.93 kg/m   Physical Exam  Constitutional: He is oriented to person, place, and time and well-developed, well-nourished, and in no distress. No distress.  Neurological: He is alert and oriented to person, place, and time. GCS score is 15.  Skin: Skin is warm and dry.     Psychiatric: Mood, memory, affect and judgment normal.  Vitals reviewed.  Procedure: Verbal consent obtained. CMA present for procedure.  Skin was anesthetized with 2%lidocaine. A 1 cm incision was made. A small amount of thrombosed material removed with forceps. Wound care discussed.  Assessment and Plan :  1. Hemorrhoids, unspecified hemorrhoid type - External hemorrhoid removed without incident. Discussed at length with pt diet and exercise. Advised sitz baths. He understands and agrees. RTC PRN.   Marco CollieWhitney Mairead Schwarzkopf, PA-C  Primary Care at [facility] 02/17/2017 4:09 PM

## 2018-01-12 ENCOUNTER — Ambulatory Visit: Payer: Self-pay | Admitting: Emergency Medicine

## 2018-02-05 ENCOUNTER — Encounter: Payer: Self-pay | Admitting: Family Medicine

## 2018-02-05 ENCOUNTER — Ambulatory Visit (INDEPENDENT_AMBULATORY_CARE_PROVIDER_SITE_OTHER): Payer: BLUE CROSS/BLUE SHIELD | Admitting: Family Medicine

## 2018-02-05 ENCOUNTER — Other Ambulatory Visit: Payer: Self-pay

## 2018-02-05 VITALS — BP 121/77 | HR 79 | Temp 99.4°F | Resp 16 | Ht 69.0 in | Wt 213.4 lb

## 2018-02-05 DIAGNOSIS — Z20828 Contact with and (suspected) exposure to other viral communicable diseases: Secondary | ICD-10-CM

## 2018-02-05 DIAGNOSIS — L210 Seborrhea capitis: Secondary | ICD-10-CM | POA: Diagnosis not present

## 2018-02-05 DIAGNOSIS — L739 Follicular disorder, unspecified: Secondary | ICD-10-CM | POA: Diagnosis not present

## 2018-02-05 LAB — POCT SKIN KOH: Skin KOH, POC: NEGATIVE

## 2018-02-05 MED ORDER — DOXYCYCLINE HYCLATE 100 MG PO TABS: 100.0000 mg | ORAL_TABLET | Freq: Two times a day (BID) | ORAL | 0 refills | Status: DC

## 2018-02-05 NOTE — Progress Notes (Signed)
Patient ID: Matthew Pace, male    DOB: 1990-04-29  Age: 27 y.o. MRN: [id number]  Chief Complaint  Patient presents with  . legs    red bumps on both legs, started in June, got worse, per pt ex-girlfriend has been diagnosed with Herpes    Subjective:   Patient has been having a rash since June on his legs, especially thighs, trunk, and upper arms.  Does not particularly itch but it is just gotten very diffuse.  His former sexual partner reported having tested positive for herpes so he would like to be tested for herpes.  We discussed that.  Current allergies, medications, problem list, past/family and social histories reviewed.  Objective:  BP 121/77 (BP Location: Right Arm, Patient Position: Sitting, Cuff Size: Large)   Pulse 79   Temp 99.4 F (37.4 C) (Oral)   Resp 16   Ht 5\' 9"  (1.753 m)   Wt 213 lb 6.4 oz (96.8 kg)   SpO2 97%   BMI 31.51 kg/m   Almost follicular appearing dermatitis on the trunk arms and thighs.  Very diffuse.  Also has a little bit of a pityriasis appearance to it.  Skin scraping was done and no fungus was seen.  Assessment & Plan:   Assessment: 1. Folliculitis   2. Pityriasis   3. 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  Refer to dermatologist if not doing better.  The other thing to consider is an empirical course of terbinafine, but will not do so at this time.  Orders Placed This Encounter  Procedures  . HSV(herpes simplex vrs) 1+2 ab-IgG  . POCT Skin KOH    Meds ordered this encounter  Medications  . doxycycline (VIBRA-TABS) 100 MG tablet    Sig: Take 1 tablet (100 mg total) by mouth 2 (two) times daily.    Dispense:  20 tablet    Refill:  0         Patient Instructions    The rash has an appearance that could represent several things.  I am treating you as if it was an infection of the hair follicles with doxycycline for 10 days.  If you do not improve, call back to this office  and tell whoever is handling the phone calls that I have instructed you that we would order for you on to a dermatologist.  Return at any time if significantly worse.   If you have lab work done today you will be contacted with your lab results within the next 2 weeks.  If you have not heard from us then please contact us. The fastest way to get your results is to register for My Chart.   IF you received an x-ray today, you will receive an invoice from [facility]. Please contact [facility] at [phone] with questions or concerns regarding your invoice.   IF you received labwork today, you will receive an invoice from [facility]. Please contact [facility] at [phone] with questions or concerns regarding your invoice.   Our billing staff will not be able to assist you with questions regarding bills from these companies.  You will be contacted with the lab results as soon as they are available. The fastest way to get your results is to activate your My Chart account. Instructions are located on the last page of this paperwork. If you have not heard from us regarding the results in 2 weeks, please contact this office.  No follow-ups on file.   Janace Hoard, MD 02/05/2018

## 2018-02-05 NOTE — Patient Instructions (Addendum)
  The rash has an appearance that could represent several things.  I am treating you as if it was an infection of the hair follicles with doxycycline for 10 days.  If you do not improve, call back to this office and tell whoever is handling the phone calls that I have instructed you that we would order for you on to a dermatologist.  Return at any time if significantly worse.   If you have lab work done today you will be contacted with your lab results within the next 2 weeks.  If you have not heard from us then please contact us. The fastest way to get your results is to register for My Chart.   IF you received an x-ray today, you will receive an invoice from [facility]. Please contact [facility] at [phone] with questions or concerns regarding your invoice.   IF you received labwork today, you will receive an invoice from [facility]. Please contact [facility] at [phone] with questions or concerns regarding your invoice.   Our billing staff will not be able to assist you with questions regarding bills from these companies.  You will be contacted with the lab results as soon as they are available. The fastest way to get your results is to activate your My Chart account. Instructions are located on the last page of this paperwork. If you have not heard from us regarding the results in 2 weeks, please contact this office.

## 2018-02-06 LAB — HSV(HERPES SIMPLEX VRS) I + II AB-IGG

## 2018-02-08 ENCOUNTER — Telehealth: Payer: Self-pay

## 2018-02-08 NOTE — Telephone Encounter (Signed)
Copied from CRM [id number]. Topic: General - Call Back - No Documentation >> Feb 08, 2018  2:51 PM Lynne LoganHudson, Caryn D wrote: Reason for CRM: Pt called to find out results of lab work from 02/05/18. Please advise [phone]  Pt message re: lab results sent to Dr. Alwyn RenHopper

## 2018-02-09 ENCOUNTER — Encounter: Payer: Self-pay | Admitting: Radiology

## 2018-02-09 ENCOUNTER — Telehealth: Payer: Self-pay | Admitting: Family Medicine

## 2018-02-09 NOTE — Telephone Encounter (Signed)
Copied from CRM [id number]. Topic: Quick Communication - Lab Results (Clinic Use ONLY) >> Feb 09, 2018  3:58 PM Clydia Llanoosenthal, [place] wrote: Called patient to inform them of  lab results. When patient returns call, triage nurse may disclose results. >> Feb 09, 2018  4:16 PM Stephannie LiSimmons, Hannah Strader L, NT wrote: Patient returned call from the practice for labs , please call him a [phone]

## 2018-05-12 ENCOUNTER — Ambulatory Visit (INDEPENDENT_AMBULATORY_CARE_PROVIDER_SITE_OTHER): Payer: BLUE CROSS/BLUE SHIELD | Admitting: Family Medicine

## 2018-05-12 DIAGNOSIS — Z111 Encounter for screening for respiratory tuberculosis: Secondary | ICD-10-CM

## 2018-05-12 NOTE — Progress Notes (Signed)

## 2018-05-12 NOTE — Patient Instructions (Signed)
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 Please come back Friday after 5 or Saturday before 5.

## 2018-05-14 ENCOUNTER — Ambulatory Visit (INDEPENDENT_AMBULATORY_CARE_PROVIDER_SITE_OTHER): Payer: BLUE CROSS/BLUE SHIELD | Admitting: Family Medicine

## 2018-05-14 DIAGNOSIS — Z111 Encounter for screening for respiratory tuberculosis: Secondary | ICD-10-CM

## 2018-05-14 LAB — TB SKIN TEST
Induration: 0 mm
TB Skin Test: NEGATIVE

## 2019-07-03 ENCOUNTER — Emergency Department (HOSPITAL_COMMUNITY): Payer: BC Managed Care – PPO | Admitting: Certified Registered Nurse Anesthetist

## 2019-07-03 ENCOUNTER — Other Ambulatory Visit: Payer: Self-pay

## 2019-07-03 ENCOUNTER — Encounter (HOSPITAL_COMMUNITY)
Admission: EM | Disposition: A | Discharge status: Home / Self Care | Payer: Self-pay | Source: Home / Self Care | Attending: Emergency Medicine

## 2019-07-03 ENCOUNTER — Observation Stay (HOSPITAL_COMMUNITY)
Admission: EM | Admit: 2019-07-03 | Discharge: 2019-07-04 | Disposition: A | Discharge status: Home / Self Care | Payer: BC Managed Care – PPO | Source: Home / Self Care | Attending: Emergency Medicine | Admitting: Emergency Medicine

## 2019-07-03 ENCOUNTER — Emergency Department (HOSPITAL_BASED_OUTPATIENT_CLINIC_OR_DEPARTMENT_OTHER): Payer: BC Managed Care – PPO

## 2019-07-03 ENCOUNTER — Encounter (HOSPITAL_BASED_OUTPATIENT_CLINIC_OR_DEPARTMENT_OTHER): Payer: Self-pay | Admitting: Respiratory Therapy

## 2019-07-03 DIAGNOSIS — K3580 Unspecified acute appendicitis: Secondary | ICD-10-CM

## 2019-07-03 DIAGNOSIS — K3532 Acute appendicitis with perforation and localized peritonitis, without abscess: Secondary | ICD-10-CM | POA: Diagnosis present

## 2019-07-03 DIAGNOSIS — Z833 Family history of diabetes mellitus: Secondary | ICD-10-CM | POA: Diagnosis not present

## 2019-07-03 DIAGNOSIS — Z79899 Other long term (current) drug therapy: Secondary | ICD-10-CM | POA: Diagnosis not present

## 2019-07-03 DIAGNOSIS — A419 Sepsis, unspecified organism: Secondary | ICD-10-CM

## 2019-07-03 DIAGNOSIS — Z823 Family history of stroke: Secondary | ICD-10-CM | POA: Diagnosis not present

## 2019-07-03 DIAGNOSIS — Z87891 Personal history of nicotine dependence: Secondary | ICD-10-CM | POA: Insufficient documentation

## 2019-07-03 DIAGNOSIS — R509 Fever, unspecified: Secondary | ICD-10-CM | POA: Diagnosis not present

## 2019-07-03 DIAGNOSIS — T8143XA Infection following a procedure, organ and space surgical site, initial encounter: Secondary | ICD-10-CM | POA: Diagnosis not present

## 2019-07-03 DIAGNOSIS — R55 Syncope and collapse: Secondary | ICD-10-CM | POA: Diagnosis not present

## 2019-07-03 DIAGNOSIS — Z20822 Contact with and (suspected) exposure to covid-19: Secondary | ICD-10-CM | POA: Insufficient documentation

## 2019-07-03 DIAGNOSIS — J9811 Atelectasis: Secondary | ICD-10-CM | POA: Diagnosis not present

## 2019-07-03 DIAGNOSIS — F17210 Nicotine dependence, cigarettes, uncomplicated: Secondary | ICD-10-CM | POA: Diagnosis not present

## 2019-07-03 DIAGNOSIS — K567 Ileus, unspecified: Secondary | ICD-10-CM | POA: Diagnosis not present

## 2019-07-03 DIAGNOSIS — R188 Other ascites: Secondary | ICD-10-CM | POA: Diagnosis not present

## 2019-07-03 DIAGNOSIS — K3533 Acute appendicitis with perforation and localized peritonitis, with abscess: Secondary | ICD-10-CM | POA: Diagnosis not present

## 2019-07-03 DIAGNOSIS — K219 Gastro-esophageal reflux disease without esophagitis: Secondary | ICD-10-CM | POA: Diagnosis not present

## 2019-07-03 DIAGNOSIS — E43 Unspecified severe protein-calorie malnutrition: Secondary | ICD-10-CM | POA: Diagnosis not present

## 2019-07-03 DIAGNOSIS — R000 Tachycardia, unspecified: Secondary | ICD-10-CM | POA: Diagnosis not present

## 2019-07-03 DIAGNOSIS — Z8249 Family history of ischemic heart disease and other diseases of the circulatory system: Secondary | ICD-10-CM | POA: Diagnosis not present

## 2019-07-03 DIAGNOSIS — Z809 Family history of malignant neoplasm, unspecified: Secondary | ICD-10-CM | POA: Diagnosis not present

## 2019-07-03 HISTORY — PX: LAPAROSCOPIC APPENDECTOMY: SHX408

## 2019-07-03 HISTORY — DX: Gastro-esophageal reflux disease without esophagitis: K21.9

## 2019-07-03 LAB — CBC WITH DIFFERENTIAL/PLATELET
Abs Immature Granulocytes: 0.05 10*3/uL (ref 0.00–0.07)
Basophils Absolute: 0 10*3/uL (ref 0.0–0.1)
Basophils Relative: 0 %
Eosinophils Absolute: 0 10*3/uL (ref 0.0–0.5)
Eosinophils Relative: 0 %
HCT: 45.1 % (ref 39.0–52.0)
Hemoglobin: 16.1 g/dL (ref 13.0–17.0)
Immature Granulocytes: 0 %
Lymphocytes Relative: 5 %
Lymphs Abs: 0.8 10*3/uL (ref 0.7–4.0)
MCH: 31.1 pg (ref 26.0–34.0)
MCHC: 35.7 g/dL (ref 30.0–36.0)
MCV: 87.2 fL (ref 80.0–100.0)
Monocytes Absolute: 1.1 10*3/uL — ABNORMAL HIGH (ref 0.1–1.0)
Monocytes Relative: 7 %
Neutro Abs: 13.5 10*3/uL — ABNORMAL HIGH (ref 1.7–7.7)
Neutrophils Relative %: 88 %
Platelets: 173 10*3/uL (ref 150–400)
RBC: 5.17 MIL/uL (ref 4.22–5.81)
RDW: 11.9 % (ref 11.5–15.5)
WBC: 15.5 10*3/uL — ABNORMAL HIGH (ref 4.0–10.5)
nRBC: 0 % (ref 0.0–0.2)

## 2019-07-03 LAB — COMPREHENSIVE METABOLIC PANEL
ALT: 18 U/L (ref 0–44)
AST: 22 U/L (ref 15–41)
Albumin: 4.4 g/dL (ref 3.5–5.0)
Alkaline Phosphatase: 79 U/L (ref 38–126)
Anion gap: 11 (ref 5–15)
BUN: 18 mg/dL (ref 6–20)
CO2: 23 mmol/L (ref 22–32)
Calcium: 9.1 mg/dL (ref 8.9–10.3)
Chloride: 101 mmol/L (ref 98–111)
Creatinine, Ser: 0.99 mg/dL (ref 0.61–1.24)
GFR calc Af Amer: >60 mL/min (ref >60)
GFR calc non Af Amer: >60 mL/min (ref >60)
Glucose, Bld: 106 mg/dL — ABNORMAL HIGH (ref 70–99)
Potassium: 3.6 mmol/L (ref 3.5–5.1)
Sodium: 135 mmol/L (ref 135–145)
Total Bilirubin: 1.3 mg/dL — ABNORMAL HIGH (ref 0.3–1.2)
Total Protein: 7.7 g/dL (ref 6.5–8.1)

## 2019-07-03 LAB — URINALYSIS, ROUTINE W REFLEX MICROSCOPIC
Bilirubin Urine: NEGATIVE
Glucose, UA: NEGATIVE mg/dL
Hgb urine dipstick: NEGATIVE
Ketones, ur: NEGATIVE mg/dL
Leukocytes,Ua: NEGATIVE
Nitrite: NEGATIVE
Protein, ur: NEGATIVE mg/dL
Specific Gravity, Urine: 1.02 (ref 1.005–1.030)
pH: 8.5 — ABNORMAL HIGH (ref 5.0–8.0)

## 2019-07-03 LAB — LACTIC ACID, PLASMA
Lactic Acid, Venous: 1.1 mmol/L (ref 0.5–1.9)
Lactic Acid, Venous: 0.9 mmol/L (ref 0.5–1.9)

## 2019-07-03 LAB — RESPIRATORY PANEL BY RT PCR (FLU A&B, COVID)
Influenza A by PCR: NEGATIVE
Influenza B by PCR: NEGATIVE
SARS Coronavirus 2 by RT PCR: NEGATIVE

## 2019-07-03 LAB — PROTIME-INR
INR: 1.1 (ref 0.8–1.2)
Prothrombin Time: 13.6 seconds (ref 11.4–15.2)

## 2019-07-03 LAB — APTT: aPTT: 28 seconds (ref 24–36)

## 2019-07-03 SURGERY — APPENDECTOMY, LAPAROSCOPIC
Anesthesia: General | Site: Abdomen

## 2019-07-03 MED ORDER — IOHEXOL 300 MG/ML  SOLN
100.0000 mL | Freq: Once | INTRAMUSCULAR | Status: AC | PRN
  Administered 2019-07-03: 100 mL via INTRAVENOUS

## 2019-07-03 MED ORDER — SODIUM CHLORIDE 0.9 % IV BOLUS
1000.0000 mL | Freq: Once | INTRAVENOUS | Status: AC
  Administered 2019-07-03: 1000 mL via INTRAVENOUS

## 2019-07-03 MED ORDER — BISACODYL 10 MG RE SUPP: 10.0000 mg | Freq: Every day | RECTAL | Status: DC | PRN

## 2019-07-03 MED ORDER — DEXAMETHASONE SODIUM PHOSPHATE 10 MG/ML IJ SOLN
INTRAMUSCULAR | Status: DC | PRN
  Administered 2019-07-03: 5 mg via INTRAVENOUS

## 2019-07-03 MED ORDER — ACETAMINOPHEN 325 MG PO TABS
650.0000 mg | ORAL_TABLET | Freq: Once | ORAL | Status: AC
  Administered 2019-07-03: 650 mg via ORAL
  Filled 2019-07-03: qty 2

## 2019-07-03 MED ORDER — PIPERACILLIN-TAZOBACTAM 3.375 G IVPB 30 MIN
3.3750 g | Freq: Once | INTRAVENOUS | Status: AC
  Administered 2019-07-03: 3.375 g via INTRAVENOUS
  Filled 2019-07-03 (×2): qty 50

## 2019-07-03 MED ORDER — ONDANSETRON HCL 4 MG/2ML IJ SOLN
INTRAMUSCULAR | Status: AC
  Filled 2019-07-03: qty 2

## 2019-07-03 MED ORDER — FENTANYL CITRATE (PF) 100 MCG/2ML IJ SOLN
INTRAMUSCULAR | Status: AC
  Filled 2019-07-03: qty 2

## 2019-07-03 MED ORDER — 0.9 % SODIUM CHLORIDE (POUR BTL) OPTIME
TOPICAL | Status: DC | PRN
  Administered 2019-07-03: 1000 mL

## 2019-07-03 MED ORDER — ACETAMINOPHEN 500 MG PO TABS
1000.0000 mg | ORAL_TABLET | Freq: Four times a day (QID) | ORAL | Status: DC
  Administered 2019-07-03 – 2019-07-04 (×3): 1000 mg via ORAL
  Filled 2019-07-03 (×2): qty 2

## 2019-07-03 MED ORDER — ONDANSETRON 4 MG PO TBDP: 4.0000 mg | ORAL_TABLET | Freq: Four times a day (QID) | ORAL | Status: DC | PRN

## 2019-07-03 MED ORDER — ENOXAPARIN SODIUM 40 MG/0.4ML ~~LOC~~ SOLN: 40.0000 mg | SUBCUTANEOUS | Status: DC

## 2019-07-03 MED ORDER — KCL IN DEXTROSE-NACL 20-5-0.45 MEQ/L-%-% IV SOLN: INTRAVENOUS | Status: DC

## 2019-07-03 MED ORDER — SIMETHICONE 80 MG PO CHEW: 40.0000 mg | CHEWABLE_TABLET | Freq: Four times a day (QID) | ORAL | Status: DC | PRN

## 2019-07-03 MED ORDER — PROPOFOL 10 MG/ML IV BOLUS
INTRAVENOUS | Status: AC
  Filled 2019-07-03: qty 40

## 2019-07-03 MED ORDER — MIDAZOLAM HCL 2 MG/2ML IJ SOLN
INTRAMUSCULAR | Status: AC
  Filled 2019-07-03: qty 2

## 2019-07-03 MED ORDER — SUCCINYLCHOLINE CHLORIDE 20 MG/ML IJ SOLN
INTRAMUSCULAR | Status: DC | PRN
  Administered 2019-07-03: 140 mg via INTRAVENOUS

## 2019-07-03 MED ORDER — DIPHENHYDRAMINE HCL 50 MG/ML IJ SOLN: 25.0000 mg | Freq: Four times a day (QID) | INTRAMUSCULAR | Status: DC | PRN

## 2019-07-03 MED ORDER — ROCURONIUM BROMIDE 10 MG/ML (PF) SYRINGE
PREFILLED_SYRINGE | INTRAVENOUS | Status: DC | PRN
  Administered 2019-07-03: 10 mg via INTRAVENOUS
  Administered 2019-07-03: 40 mg via INTRAVENOUS

## 2019-07-03 MED ORDER — DIPHENHYDRAMINE HCL 25 MG PO CAPS: 25.0000 mg | ORAL_CAPSULE | Freq: Four times a day (QID) | ORAL | Status: DC | PRN

## 2019-07-03 MED ORDER — FENTANYL CITRATE (PF) 100 MCG/2ML IJ SOLN
25.0000 ug | INTRAMUSCULAR | Status: DC | PRN
  Administered 2019-07-03 (×3): 50 ug via INTRAVENOUS

## 2019-07-03 MED ORDER — LIDOCAINE 2% (20 MG/ML) 5 ML SYRINGE
INTRAMUSCULAR | Status: DC | PRN
  Administered 2019-07-03: 80 mg via INTRAVENOUS

## 2019-07-03 MED ORDER — MIDAZOLAM HCL 5 MG/5ML IJ SOLN
INTRAMUSCULAR | Status: DC | PRN
  Administered 2019-07-03: 2 mg via INTRAVENOUS

## 2019-07-03 MED ORDER — SUGAMMADEX SODIUM 200 MG/2ML IV SOLN
INTRAVENOUS | Status: DC | PRN
  Administered 2019-07-03: 200 mg via INTRAVENOUS

## 2019-07-03 MED ORDER — LACTATED RINGERS IV SOLN: INTRAVENOUS | Status: DC | PRN

## 2019-07-03 MED ORDER — BUPIVACAINE-EPINEPHRINE 0.5% -1:200000 IJ SOLN
INTRAMUSCULAR | Status: AC
  Filled 2019-07-03: qty 1

## 2019-07-03 MED ORDER — ACETAMINOPHEN 500 MG PO TABS
ORAL_TABLET | ORAL | Status: AC
  Filled 2019-07-03: qty 2

## 2019-07-03 MED ORDER — ONDANSETRON HCL 4 MG/2ML IJ SOLN
INTRAMUSCULAR | Status: DC | PRN
  Administered 2019-07-03: 4 mg via INTRAVENOUS

## 2019-07-03 MED ORDER — ONDANSETRON HCL 4 MG/2ML IJ SOLN
4.0000 mg | Freq: Four times a day (QID) | INTRAMUSCULAR | Status: DC | PRN
  Administered 2019-07-04: 4 mg via INTRAVENOUS
  Filled 2019-07-03 (×2): qty 2

## 2019-07-03 MED ORDER — LACTATED RINGERS IR SOLN
Status: DC | PRN
  Administered 2019-07-03: 1000 mL

## 2019-07-03 MED ORDER — SODIUM CHLORIDE 0.9 % IV SOLN: Freq: Once | INTRAVENOUS | Status: AC

## 2019-07-03 MED ORDER — PROPOFOL 10 MG/ML IV BOLUS
INTRAVENOUS | Status: DC | PRN
  Administered 2019-07-03: 200 mg via INTRAVENOUS

## 2019-07-03 MED ORDER — FENTANYL CITRATE (PF) 250 MCG/5ML IJ SOLN
INTRAMUSCULAR | Status: AC
  Filled 2019-07-03: qty 5

## 2019-07-03 MED ORDER — FENTANYL CITRATE (PF) 100 MCG/2ML IJ SOLN
INTRAMUSCULAR | Status: DC | PRN
  Administered 2019-07-03: 50 ug via INTRAVENOUS
  Administered 2019-07-03: 100 ug via INTRAVENOUS
  Administered 2019-07-03 (×4): 50 ug via INTRAVENOUS

## 2019-07-03 MED ORDER — BUPIVACAINE-EPINEPHRINE 0.5% -1:200000 IJ SOLN
INTRAMUSCULAR | Status: DC | PRN
  Administered 2019-07-03: 20 mL

## 2019-07-03 MED ORDER — HYDROCODONE-ACETAMINOPHEN 5-325 MG PO TABS: 1.0000 | ORAL_TABLET | ORAL | Status: DC | PRN

## 2019-07-03 MED ORDER — PHENYLEPHRINE 40 MCG/ML (10ML) SYRINGE FOR IV PUSH (FOR BLOOD PRESSURE SUPPORT)
PREFILLED_SYRINGE | INTRAVENOUS | Status: DC | PRN
  Administered 2019-07-03: 80 ug via INTRAVENOUS

## 2019-07-03 MED ORDER — MORPHINE SULFATE (PF) 4 MG/ML IV SOLN
4.0000 mg | Freq: Once | INTRAVENOUS | Status: DC
  Filled 2019-07-03: qty 1

## 2019-07-03 MED ORDER — ONDANSETRON HCL 4 MG/2ML IJ SOLN
4.0000 mg | Freq: Once | INTRAMUSCULAR | Status: AC
  Administered 2019-07-03: 4 mg via INTRAVENOUS
  Filled 2019-07-03: qty 2

## 2019-07-03 MED ORDER — KCL IN DEXTROSE-NACL 20-5-0.45 MEQ/L-%-% IV SOLN
INTRAVENOUS | Status: AC
  Filled 2019-07-03: qty 1000

## 2019-07-03 MED ORDER — SENNOSIDES-DOCUSATE SODIUM 8.6-50 MG PO TABS
1.0000 | ORAL_TABLET | Freq: Every day | ORAL | Status: DC
  Administered 2019-07-03: 1 via ORAL
  Filled 2019-07-03: qty 1

## 2019-07-03 MED ORDER — MORPHINE SULFATE (PF) 2 MG/ML IV SOLN
2.0000 mg | INTRAVENOUS | Status: DC | PRN
  Administered 2019-07-03: 2 mg via INTRAVENOUS
  Filled 2019-07-03: qty 1

## 2019-07-03 SURGICAL SUPPLY — 41 items
APPLIER CLIP 5 13 M/L LIGAMAX5 (MISCELLANEOUS)
APPLIER CLIP ROT 10 11.4 M/L (STAPLE)
CABLE HIGH FREQUENCY MONO STRZ (ELECTRODE) ×2 IMPLANT
CHLORAPREP W/TINT 26 (MISCELLANEOUS) ×2 IMPLANT
CLIP APPLIE 5 13 M/L LIGAMAX5 (MISCELLANEOUS) IMPLANT
CLIP APPLIE ROT 10 11.4 M/L (STAPLE) IMPLANT
COVER WAND RF STERILE (DRAPES) IMPLANT
DECANTER SPIKE VIAL GLASS SM (MISCELLANEOUS) ×2 IMPLANT
DERMABOND ADVANCED (GAUZE/BANDAGES/DRESSINGS) ×1
DERMABOND ADVANCED .7 DNX12 (GAUZE/BANDAGES/DRESSINGS) ×1 IMPLANT
DRAPE LAPAROSCOPIC ABDOMINAL (DRAPES) ×2 IMPLANT
ELECT REM PT RETURN 15FT ADLT (MISCELLANEOUS) ×2 IMPLANT
GLOVE BIO SURGEON STRL SZ 6.5 (GLOVE) ×2 IMPLANT
GLOVE BIOGEL PI IND STRL 7.0 (GLOVE) ×1 IMPLANT
GLOVE BIOGEL PI INDICATOR 7.0 (GLOVE) ×1
GOWN STRL REUS W/TWL XL LVL3 (GOWN DISPOSABLE) ×4 IMPLANT
GRASPER SUT TROCAR 14GX15 (MISCELLANEOUS) ×2 IMPLANT
HANDLE STAPLE EGIA 4 XL (STAPLE) ×2 IMPLANT
IRRIG SUCT STRYKERFLOW 2 WTIP (MISCELLANEOUS) ×2
IRRIGATION SUCT STRKRFLW 2 WTP (MISCELLANEOUS) ×1 IMPLANT
KIT BASIN OR (CUSTOM PROCEDURE TRAY) ×2 IMPLANT
KIT TURNOVER KIT A (KITS) IMPLANT
MARKER SKIN DUAL TIP RULER LAB (MISCELLANEOUS) IMPLANT
PENCIL SMOKE EVACUATOR (MISCELLANEOUS) IMPLANT
POUCH SPECIMEN RETRIEVAL 10MM (ENDOMECHANICALS) ×2 IMPLANT
RELOAD EGIA 45 MED/THCK PURPLE (STAPLE) IMPLANT
RELOAD EGIA 45 TAN VASC (STAPLE) IMPLANT
RELOAD EGIA 60 MED/THCK PURPLE (STAPLE) ×2 IMPLANT
RELOAD EGIA 60 TAN VASC (STAPLE) IMPLANT
SCISSORS LAP 5X35 DISP (ENDOMECHANICALS) IMPLANT
SHEARS HARMONIC ACE PLUS 45CM (MISCELLANEOUS) ×2 IMPLANT
SLEEVE XCEL OPT CAN 5 100 (ENDOMECHANICALS) ×2 IMPLANT
SUT VIC AB 2-0 SH 27 (SUTURE)
SUT VIC AB 2-0 SH 27X BRD (SUTURE) IMPLANT
SUT VIC AB 4-0 PS2 27 (SUTURE) ×2 IMPLANT
SUT VICRYL 0 UR6 27IN ABS (SUTURE) ×2 IMPLANT
TOWEL OR 17X26 10 PK STRL BLUE (TOWEL DISPOSABLE) ×2 IMPLANT
TRAY FOLEY MTR SLVR 16FR STAT (SET/KITS/TRAYS/PACK) ×2 IMPLANT
TRAY LAPAROSCOPIC (CUSTOM PROCEDURE TRAY) ×2 IMPLANT
TROCAR BLADELESS OPT 5 100 (ENDOMECHANICALS) ×2 IMPLANT
TROCAR XCEL BLUNT TIP 100MML (ENDOMECHANICALS) ×2 IMPLANT

## 2019-07-03 NOTE — H&P (Signed)
CC: abd pain, fevers, nausea  HPI: Matthew Pace is a 29 year old male who presented to [facility] ED with abdominal pain, fever and nausea.  Patient febrile and tachycardic upon arrival.  Lower abdominal pain since yesterday.  Pain now worse in the right lower quadrant.  Has had nausea and vomiting.  H/o Covid infection 4 months ago.  Otherwise healthy.  Past Medical History:  Diagnosis Date  . GERD (gastroesophageal reflux disease)     History reviewed. No pertinent surgical history.  Family History  Problem Relation Age of Onset  . Diabetes Mother   . Hypertension Mother   . Diabetes Maternal Grandmother   . Stroke Maternal Grandmother   . Cancer Maternal Grandfather   . Heart disease Paternal Grandfather   . Stroke Paternal Grandfather     Social:  reports that he quit smoking about 3 years ago. His smoking use included cigarettes. He smoked 1.00 pack per day. He has never used smokeless tobacco. He reports current alcohol use of about 7.0 standard drinks of alcohol per week. He reports that he does not use drugs.  Allergies: No Known Allergies  Medications: I have reviewed the patient's current medications.  Results for orders placed or performed during the hospital encounter of 07/03/19 (from the past 48 hour(s))  Lactic acid, plasma     Status: None   Collection Time: 07/03/19 11:26 AM  Result Value Ref Range   Lactic Acid, Venous 1.1 0.5 - 1.9 mmol/L    Comment: Performed at [facility], [address] [place], [place] [address]  Comprehensive metabolic panel     Status: Abnormal   Collection Time: 07/03/19 11:26 AM  Result Value Ref Range   Sodium 135 135 - 145 mmol/L   Potassium 3.6 3.5 - 5.1 mmol/L   Chloride 101 98 - 111 mmol/L   CO2 23 22 - 32 mmol/L   Glucose, Bld 106 (H) 70 - 99 mg/dL    Comment: Glucose reference range applies only to samples taken after fasting for at least 8 hours.   BUN 18 6 - 20 mg/dL   Creatinine, Ser 0.99 0.61 - 1.24 mg/dL    Calcium 9.1 8.9 - 83.3 mg/dL   Total Protein 7.7 6.5 - 8.1 g/dL   Albumin 4.4 3.5 - 5.0 g/dL   AST 22 15 - 41 U/L   ALT 18 0 - 44 U/L   Alkaline Phosphatase 79 38 - 126 U/L   Total Bilirubin 1.3 (H) 0.3 - 1.2 mg/dL   GFR calc non Af Amer >60 >60 mL/min   GFR calc Af Amer >60 >60 mL/min   Anion gap 11 5 - 15    Comment: Performed at [facility], [address] [place], [place] [address]  CBC WITH DIFFERENTIAL     Status: Abnormal   Collection Time: 07/03/19 11:26 AM  Result Value Ref Range   WBC 15.5 (H) 4.0 - 10.5 K/uL   RBC 5.17 4.22 - 5.81 MIL/uL   Hemoglobin 16.1 13.0 - 17.0 g/dL   HCT 39.7 67.3 - 41.9 %   MCV 87.2 80.0 - 100.0 fL   MCH 31.1 26.0 - 34.0 pg   MCHC 35.7 30.0 - 36.0 g/dL   RDW 37.9 02.4 - 09.7 %   Platelets 173 150 - 400 K/uL   nRBC 0.0 0.0 - 0.2 %   Neutrophils Relative % 88 %   Neutro Abs 13.5 (H) 1.7 - 7.7 K/uL   Lymphocytes Relative  5 %   Lymphs Abs 0.8 0.7 - 4.0 K/uL   Monocytes Relative 7 %   Monocytes Absolute 1.1 (H) 0.1 - 1.0 K/uL   Eosinophils Relative 0 %   Eosinophils Absolute 0.0 0.0 - 0.5 K/uL   Basophils Relative 0 %   Basophils Absolute 0.0 0.0 - 0.1 K/uL   Immature Granulocytes 0 %   Abs Immature Granulocytes 0.05 0.00 - 0.07 K/uL    Comment: Performed at [facility], [address] [place], [place] [address]  APTT     Status: None   Collection Time: 07/03/19 11:26 AM  Result Value Ref Range   aPTT 28 24 - 36 seconds    Comment: Performed at [facility] [place], [address] [place], [place] [address]  Protime-INR     Status: None   Collection Time: 07/03/19 11:26 AM  Result Value Ref Range   Prothrombin Time 13.6 11.4 - 15.2 seconds   INR 1.1 0.8 - 1.2    Comment: (NOTE) INR goal varies based on device and disease states. Performed at [facility] [place], [address] [place], [place] [address]   Urinalysis, Routine w reflex microscopic     Status: Abnormal   Collection Time: 07/03/19 11:26  AM  Result Value Ref Range   Color, Urine YELLOW YELLOW   APPearance CLEAR CLEAR   Specific Gravity, Urine 1.020 1.005 - 1.030   pH 8.5 (H) 5.0 - 8.0   Glucose, UA NEGATIVE NEGATIVE mg/dL   Hgb urine dipstick NEGATIVE NEGATIVE   Bilirubin Urine NEGATIVE NEGATIVE   Ketones, ur NEGATIVE NEGATIVE mg/dL   Protein, ur NEGATIVE NEGATIVE mg/dL   Nitrite NEGATIVE NEGATIVE   Leukocytes,Ua NEGATIVE NEGATIVE    Comment: Microscopic not done on urines with negative protein, blood, leukocytes, nitrite, or glucose < 500 mg/dL. Performed at [facility] [place], [address] [place], [place] [address]   Respiratory Panel by RT PCR (Flu A&B, Covid) - Urine, Clean Catch     Status: None   Collection Time: 07/03/19 11:28 AM   Specimen: Urine, Clean Catch  Result Value Ref Range   SARS Coronavirus 2 by RT PCR NEGATIVE NEGATIVE    Comment: (NOTE) SARS-CoV-2 target nucleic acids are NOT DETECTED. The SARS-CoV-2 RNA is generally detectable in upper respiratoy specimens during the acute phase of infection. The lowest concentration of SARS-CoV-2 viral copies this assay can detect is 131 copies/mL. A negative result does not preclude SARS-Cov-2 infection and should not be used as the sole basis for treatment or other patient management decisions. A negative result may occur with  improper specimen collection/handling, submission of specimen other than nasopharyngeal swab, presence of viral mutation(s) within the areas targeted by this assay, and inadequate number of viral copies (<131 copies/mL). A negative result must be combined with clinical observations, patient history, and epidemiological information. The expected result is Negative. Fact Sheet for Patients:  https://www.moore.com/https://www.fda.gov/media/142436/download Fact Sheet for Healthcare Providers:  https://www.young.biz/https://www.fda.gov/media/142435/download This test is not yet ap proved or cleared by the [place] FDA and  has been authorized for detection and/or  diagnosis of SARS-CoV-2 by FDA under an Emergency Use Authorization (EUA). This EUA will remain  in effect (meaning this test can be used) for the duration of the COVID-19 declaration under Section 564(b)(1) of the Act, 21 U.S.C. section 360bbb-3(b)(1), unless the authorization is terminated or revoked sooner.    Influenza A by PCR NEGATIVE NEGATIVE   Influenza B  by PCR NEGATIVE NEGATIVE    Comment: (NOTE) The Xpert Xpress SARS-CoV-2/FLU/RSV assay is intended as an aid in  the diagnosis of influenza from Nasopharyngeal swab specimens and  should not be used as a sole basis for treatment. Nasal washings and  aspirates are unacceptable for Xpert Xpress SARS-CoV-2/FLU/RSV  testing. Fact Sheet for Patients: https://www.fda.gov/mediahttps://www.moore.com/care Providers: https://www.young.biz/ This test is not yet approved or cleared by the [place] FDA and  has been authorized for detection and/or diagnosis of SARS-CoV-2 by  FDA under an Emergency Use Authorization (EUA). This EUA will remain  in effect (meaning this test can be used) for the duration of the  Covid-19 declaration under Section 564(b)(1) of the Act, 21  U.S.C. section 360bbb-3(b)(1), unless the authorization is  terminated or revoked. Performed at [facility], [address] [place], [place] [address]     CT ABDOMEN PELVIS W CONTRAST  Result Date: 07/03/2019 CLINICAL DATA:  Abdominal distension RIGHT lower quadrant pain. Vomiting. EXAM: CT ABDOMEN AND PELVIS WITH CONTRAST TECHNIQUE: Multidetector CT imaging of the abdomen and pelvis was performed using the standard protocol following bolus administration of intravenous contrast. CONTRAST:  OMNIPAQUE IOHEXOL 300 MG/ML  SOLN COMPARISON:  None. FINDINGS: Lower chest: Patchy ground-glass opacities within the lingula. Hepatobiliary: No focal liver abnormality is seen. No gallstones, gallbladder wall thickening, or  biliary dilatation. Pancreas: Unremarkable. No pancreatic ductal dilatation or surrounding inflammatory changes. Spleen: Normal in size without focal abnormality. Adrenals/Urinary Tract: Adrenal glands appear normal. 3 mm nonobstructing LEFT renal stone. Kidneys are otherwise unremarkable without mass, stone or hydronephrosis. No ureteral or bladder calculi identified. Bladder is unremarkable, partially decompressed. Stomach/Bowel: Appendix: Location: RIGHT lower quadrant Diameter: 1.6 cm Appendicolith: 1 appendicolith near the base of the appendix measures 7 mm. A second smaller appendicolith near the distal tip. Mucosal hyper-enhancement: Questionable Extraluminal gas: None seen Periappendiceal collection: Fluid stranding without discrete collection/abscess. Reactive thickening of the walls of the cecum. No dilated large or small bowel loops. Stomach is unremarkable, partially decompressed. Vascular/Lymphatic: Reactive lymphadenopathy in the RIGHT lower quadrant. No vascular abnormality. Reproductive: Prostate is unremarkable. Other: No abscess collection or free intraperitoneal air. Musculoskeletal: No acute or suspicious osseous finding. Superficial soft tissues are unremarkable. IMPRESSION: 1. Acute appendicitis. Appendix is distended to 1.6 cm. There are at least 2 appendicoliths, the largest stone measuring 7 mm near the base of the appendix. Significant amount of periappendiceal inflammation/fluid stranding. No abscess collection seen. No free intraperitoneal air. 2. Patchy ground-glass opacities within the lingula. Differential includes atypical pneumonias such as viral or fungal, aspiration pneumonitis and respiratory bronchiolitis. COVID-19 pneumonia can have this appearance. 3. 3 mm nonobstructing LEFT renal stone. Electronically Signed   By: Bary Richard M.D.   On: 07/03/2019 13:02    ROS - all of the below systems have been reviewed with the patient and positives are indicated with bold  text General: chills, fever or night sweats Eyes: blurry vision or double vision ENT: epistaxis or sore throat Allergy/Immunology: itchy/watery eyes or nasal congestion Hematologic/Lymphatic: bleeding problems, blood clots or swollen lymph nodes Endocrine: temperature intolerance or unexpected weight changes Breast: new or changing breast lumps or nipple discharge Resp: cough, shortness of breath, or wheezing CV: chest pain or dyspnea on exertion GI: as per HPI GU: dysuria, trouble voiding, or hematuria MSK: joint pain or joint stiffness Neuro: TIA or stroke symptoms Derm: pruritus and skin lesion changes Psych: anxiety and depression  PE Blood pressure 119/74, pulse Marland Kitchen)  113, temperature (!) 102.6 F (39.2 C), temperature source Oral, resp. rate 20, height 5\' 10"  (1.778 m), weight 99.8 kg, SpO2 99 %. Constitutional: NAD; conversant; no deformities Eyes: Moist conjunctiva; no lid lag; anicteric; PERRL Neck: Trachea midline; no thyromegaly Lungs: Normal respiratory effort; no tactile fremitus CV: RRR; no palpable thrills; no pitting edema GI: Abd TTP RLQ; no palpable hepatosplenomegaly MSK: Normal range of motion of extremities; no clubbing/cyanosis Psychiatric: Appropriate affect; alert and oriented x3 Lymphatic: No palpable cervical or axillary lymphadenopathy  Results for orders placed or performed during the hospital encounter of 07/03/19 (from the past 48 hour(s))  Lactic acid, plasma     Status: None   Collection Time: 07/03/19 11:26 AM  Result Value Ref Range   Lactic Acid, Venous 1.1 0.5 - 1.9 mmol/L    Comment: Performed at [facility], [address] [place], [place] [address]  Comprehensive metabolic panel     Status: Abnormal   Collection Time: 07/03/19 11:26 AM  Result Value Ref Range   Sodium 135 135 - 145 mmol/L   Potassium 3.6 3.5 - 5.1 mmol/L   Chloride 101 98 - 111 mmol/L   CO2 23 22 - 32 mmol/L   Glucose, Bld 106 (H) 70 - 99 mg/dL    Comment:  Glucose reference range applies only to samples taken after fasting for at least 8 hours.   BUN 18 6 - 20 mg/dL   Creatinine, Ser 0.99 0.61 - 1.24 mg/dL   Calcium 9.1 8.9 - 10.3 mg/dL   Total Protein 7.7 6.5 - 8.1 g/dL   Albumin 4.4 3.5 - 5.0 g/dL   AST 22 15 - 41 U/L   ALT 18 0 - 44 U/L   Alkaline Phosphatase 79 38 - 126 U/L   Total Bilirubin 1.3 (H) 0.3 - 1.2 mg/dL   GFR calc non Af Amer >60 >60 mL/min   GFR calc Af Amer >60 >60 mL/min   Anion gap 11 5 - 15    Comment: Performed at [facility], [address] [place], [place] [address]  CBC WITH DIFFERENTIAL     Status: Abnormal   Collection Time: 07/03/19 11:26 AM  Result Value Ref Range   WBC 15.5 (H) 4.0 - 10.5 K/uL   RBC 5.17 4.22 - 5.81 MIL/uL   Hemoglobin 16.1 13.0 - 17.0 g/dL   HCT 45.1 39.0 - 52.0 %   MCV 87.2 80.0 - 100.0 fL   MCH 31.1 26.0 - 34.0 pg   MCHC 35.7 30.0 - 36.0 g/dL   RDW 11.9 11.5 - 15.5 %   Platelets 173 150 - 400 K/uL   nRBC 0.0 0.0 - 0.2 %   Neutrophils Relative % 88 %   Neutro Abs 13.5 (H) 1.7 - 7.7 K/uL   Lymphocytes Relative 5 %   Lymphs Abs 0.8 0.7 - 4.0 K/uL   Monocytes Relative 7 %   Monocytes Absolute 1.1 (H) 0.1 - 1.0 K/uL   Eosinophils Relative 0 %   Eosinophils Absolute 0.0 0.0 - 0.5 K/uL   Basophils Relative 0 %   Basophils Absolute 0.0 0.0 - 0.1 K/uL   Immature Granulocytes 0 %   Abs Immature Granulocytes 0.05 0.00 - 0.07 K/uL    Comment: Performed at [facility], [address] [place], [place] [address]  APTT     Status: None   Collection Time: 07/03/19 11:26 AM  Result Value Ref Range   aPTT 28  24 - 36 seconds    Comment: Performed at [facility], [address] [place], [place] [address]  Protime-INR     Status: None   Collection Time: 07/03/19 11:26 AM  Result Value Ref Range   Prothrombin Time 13.6 11.4 - 15.2 seconds   INR 1.1 0.8 - 1.2    Comment: (NOTE) INR goal varies based on device and disease states. Performed at [facility], [address] [place], [place] [address]   Urinalysis, Routine w reflex microscopic     Status: Abnormal   Collection Time: 07/03/19 11:26 AM  Result Value Ref Range   Color, Urine YELLOW YELLOW   APPearance CLEAR CLEAR   Specific Gravity, Urine 1.020 1.005 - 1.030   pH 8.5 (H) 5.0 - 8.0   Glucose, UA NEGATIVE NEGATIVE mg/dL   Hgb urine dipstick NEGATIVE NEGATIVE   Bilirubin Urine NEGATIVE NEGATIVE   Ketones, ur NEGATIVE NEGATIVE mg/dL   Protein, ur NEGATIVE NEGATIVE mg/dL   Nitrite NEGATIVE NEGATIVE   Leukocytes,Ua NEGATIVE NEGATIVE    Comment: Microscopic not done on urines with negative protein, blood, leukocytes, nitrite, or glucose < 500 mg/dL. Performed at [facility], [address] [place], [place] [address]   Respiratory Panel by RT PCR (Flu A&B, Covid) - Urine, Clean Catch     Status: None   Collection Time: 07/03/19 11:28 AM   Specimen: Urine, Clean Catch  Result Value Ref Range   SARS Coronavirus 2 by RT PCR NEGATIVE NEGATIVE    Comment: (NOTE) SARS-CoV-2 target nucleic acids are NOT DETECTED. The SARS-CoV-2 RNA is generally detectable in upper respiratoy specimens during the acute phase of infection. The lowest concentration of SARS-CoV-2 viral copies this assay can detect is 131 copies/mL. A negative result does not preclude SARS-Cov-2 infection and should not be used as the sole basis for treatment or other patient management decisions. A negative result may occur with  improper specimen collection/handling, submission of specimen other than nasopharyngeal swab, presence of viral mutation(s) within the areas targeted by this assay, and inadequate number of viral copies (<131 copies/mL). A negative result must be combined with clinical observations, patient history, and epidemiological information. The expected result is Negative. Fact Sheet for Patients:  https://www.moore.com/ Fact Sheet for Healthcare Providers:   https://www.young.biz/ This test is not yet ap proved or cleared by the [place] FDA and  has been authorized for detection and/or diagnosis of SARS-CoV-2 by FDA under an Emergency Use Authorization (EUA). This EUA will remain  in effect (meaning this test can be used) for the duration of the COVID-19 declaration under Section 564(b)(1) of the Act, 21 U.S.C. section 360bbb-3(b)(1), unless the authorization is terminated or revoked sooner.    Influenza A by PCR NEGATIVE NEGATIVE   Influenza B by PCR NEGATIVE NEGATIVE    Comment: (NOTE) The Xpert Xpress SARS-CoV-2/FLU/RSV assay is intended as an aid in  the diagnosis of influenza from Nasopharyngeal swab specimens and  should not be used as a sole basis for treatment. Nasal washings and  aspirates are unacceptable for Xpert Xpress SARS-CoV-2/FLU/RSV  testing. Fact Sheet for Patients: https://www.moore.com/ Fact Sheet for Healthcare Providers: https://www.young.biz/ This test is not yet approved or cleared by the [place] FDA and  has been authorized for detection and/or diagnosis of SARS-CoV-2 by  FDA under an Emergency Use Authorization (EUA). This EUA will remain  in effect (meaning this test can be used) for the duration of  the  Covid-19 declaration under Section 564(b)(1) of the Act, 21  U.S.C. section 360bbb-3(b)(1), unless the authorization is  terminated or revoked. Performed at [facility], [address] [place], [place] [address]     CT ABDOMEN PELVIS W CONTRAST  Result Date: 07/03/2019 CLINICAL DATA:  Abdominal distension RIGHT lower quadrant pain. Vomiting. EXAM: CT ABDOMEN AND PELVIS WITH CONTRAST TECHNIQUE: Multidetector CT imaging of the abdomen and pelvis was performed using the standard protocol following bolus administration of intravenous contrast. CONTRAST:  OMNIPAQUE IOHEXOL 300 MG/ML  SOLN COMPARISON:  None. FINDINGS: Lower  chest: Patchy ground-glass opacities within the lingula. Hepatobiliary: No focal liver abnormality is seen. No gallstones, gallbladder wall thickening, or biliary dilatation. Pancreas: Unremarkable. No pancreatic ductal dilatation or surrounding inflammatory changes. Spleen: Normal in size without focal abnormality. Adrenals/Urinary Tract: Adrenal glands appear normal. 3 mm nonobstructing LEFT renal stone. Kidneys are otherwise unremarkable without mass, stone or hydronephrosis. No ureteral or bladder calculi identified. Bladder is unremarkable, partially decompressed. Stomach/Bowel: Appendix: Location: RIGHT lower quadrant Diameter: 1.6 cm Appendicolith: 1 appendicolith near the base of the appendix measures 7 mm. A second smaller appendicolith near the distal tip. Mucosal hyper-enhancement: Questionable Extraluminal gas: None seen Periappendiceal collection: Fluid stranding without discrete collection/abscess. Reactive thickening of the walls of the cecum. No dilated large or small bowel loops. Stomach is unremarkable, partially decompressed. Vascular/Lymphatic: Reactive lymphadenopathy in the RIGHT lower quadrant. No vascular abnormality. Reproductive: Prostate is unremarkable. Other: No abscess collection or free intraperitoneal air. Musculoskeletal: No acute or suspicious osseous finding. Superficial soft tissues are unremarkable. IMPRESSION: 1. Acute appendicitis. Appendix is distended to 1.6 cm. There are at least 2 appendicoliths, the largest stone measuring 7 mm near the base of the appendix. Significant amount of periappendiceal inflammation/fluid stranding. No abscess collection seen. No free intraperitoneal air. 2. Patchy ground-glass opacities within the lingula. Differential includes atypical pneumonias such as viral or fungal, aspiration pneumonitis and respiratory bronchiolitis. COVID-19 pneumonia can have this appearance. 3. 3 mm nonobstructing LEFT renal stone. Electronically Signed   By: Bary Richard M.D.   On: 07/03/2019 13:02     A/P: Matthew Pace is an 29 y.o. male with acute appendicitis.  I have recommended lap appendectomy.  Surgical risks include bleeding, infection, hernia, leak of staple line, damage to adjacent structures and need for additional procedures or prolonged hospitalization.  I believe he understands these risks are small and are outweighed by the benefits of surgical excision.  All questions answered.    Vanita Panda, MD  Colorectal and General Surgery [facility]

## 2019-07-03 NOTE — ED Triage Notes (Addendum)
Low abd pain since yesterday. Vomited today. Pain radiates into groin area. Took 600mg  ibuprofen at 0830

## 2019-07-03 NOTE — ED Provider Notes (Signed)
Pt seen at [facility].  Here to see general surgery for acute appendicitis.  Pt appears stable.  No distress.  Plan is for or.   Linwood Dibbles, MD 07/03/19 1510

## 2019-07-03 NOTE — Anesthesia Procedure Notes (Signed)
Procedure Name: Intubation Performed by: Gibran Veselka J, CRNA Pre-anesthesia Checklist: Patient identified, Emergency Drugs available, Suction available, Patient being monitored and Timeout performed Patient Re-evaluated:Patient Re-evaluated prior to induction Oxygen Delivery Method: Circle system utilized Preoxygenation: Pre-oxygenation with 100% oxygen Induction Type: IV induction and Rapid sequence Laryngoscope Size: Mac and 4 Grade View: Grade I Tube type: Oral Tube size: 7.5 mm Number of attempts: 1 Airway Equipment and Method: Stylet Placement Confirmation: ETT inserted through vocal cords under direct vision,  positive ETCO2 and breath sounds checked- equal and bilateral Secured at: 23 cm Tube secured with: Tape Dental Injury: Teeth and Oropharynx as per pre-operative assessment        

## 2019-07-03 NOTE — ED Notes (Signed)
Urine and culture sent to lab  

## 2019-07-03 NOTE — Transfer of Care (Signed)
Immediate Anesthesia Transfer of Care Note  Patient: Matthew Pace  Procedure(s) Performed: APPENDECTOMY LAPAROSCOPIC (N/A Abdomen)  Patient Location: PACU  Anesthesia Type:General  Level of Consciousness: awake, alert  and oriented  Airway & Oxygen Therapy: Patient Spontanous Breathing and Patient connected to face mask oxygen  Post-op Assessment: Report given to RN and Post -op Vital signs reviewed and stable  Post vital signs: Reviewed and stable  Last Vitals:  Vitals Value Taken Time  BP    Temp    Pulse    Resp    SpO2      Last Pain:  Vitals:   07/03/19 1427  TempSrc:   PainSc: 0-No pain         Complications: No apparent anesthesia complications

## 2019-07-03 NOTE — Anesthesia Preprocedure Evaluation (Signed)
Anesthesia Evaluation  Patient identified by MRN, date of birth, ID band Patient awake    Reviewed: Allergy & Precautions, NPO status , Patient's Chart, lab work & pertinent test results  Airway Mallampati: II  TM Distance: >3 FB     Dental   Pulmonary former smoker,    breath sounds clear to auscultation       Cardiovascular negative cardio ROS   Rhythm:Regular Rate:Normal     Neuro/Psych    GI/Hepatic Neg liver ROS, GERD  ,History noted. CG   Endo/Other  negative endocrine ROS  Renal/GU negative Renal ROS     Musculoskeletal   Abdominal   Peds  Hematology   Anesthesia Other Findings   Reproductive/Obstetrics                             Anesthesia Physical Anesthesia Plan  ASA: III  Anesthesia Plan: General   Post-op Pain Management:    Induction: Intravenous  PONV Risk Score and Plan: 2 and Ondansetron, Dexamethasone and Midazolam  Airway Management Planned: Oral ETT  Additional Equipment:   Intra-op Plan:   Post-operative Plan: Extubation in OR  Informed Consent: I have reviewed the patients History and Physical, chart, labs and discussed the procedure including the risks, benefits and alternatives for the proposed anesthesia with the patient or authorized representative who has indicated his/her understanding and acceptance.     Dental advisory given  Plan Discussed with: CRNA and Anesthesiologist  Anesthesia Plan Comments:         Anesthesia Quick Evaluation

## 2019-07-03 NOTE — Op Note (Signed)
Matthew Pace [id number]   PRE-OPERATIVE DIAGNOSIS:  acute appendicitis  POST-OPERATIVE DIAGNOSIS:  Perforated appendicitis (contained)   Procedure(s): APPENDECTOMY LAPAROSCOPIC    Surgeon(s): Romie Levee, MD  ASSISTANT: none   ANESTHESIA:   local and general  EBL:   20 ml  Delay start of Pharmacological VTE agent (>24hrs) due to surgical blood loss or risk of bleeding:  no  DRAINS: none   SPECIMEN:  Source of Specimen:  appendix  DISPOSITION OF SPECIMEN:  PATHOLOGY  COUNTS:  YES  PLAN OF CARE: Admit for overnight observation  PATIENT DISPOSITION:  PACU - hemodynamically stable.   INDICATIONS: Patient with concerning symptoms & work up suspicious for appendicitis.  Surgery was recommended:  The anatomy & physiology of the digestive tract was discussed.  The pathophysiology of appendicitis was discussed.  Natural history risks without surgery was discussed.   I feel the risks of no intervention will lead to serious problems that outweigh the operative risks; therefore, I recommended diagnostic laparoscopy with removal of appendix to remove the pathology.  Laparoscopic & open techniques were discussed.   I noted a good likelihood this will help address the problem.    Risks such as bleeding, infection, abscess, leak, reoperation, possible ostomy, hernia, heart attack, death, and other risks were discussed.  Goals of post-operative recovery were discussed as well.  We will work to minimize complications.  Questions were answered.  The patient expresses understanding & wishes to proceed with surgery.  OR FINDINGS: contained perforation anteriorly at the base  DESCRIPTION:   The patient was identified & brought into the operating room. The patient was positioned supine with left arm tucked. SCDs were active during the entire case. The patient underwent general anesthesia without any difficulty.  A foley catheter was inserted under sterile conditions. The abdomen was prepped  and draped in a sterile fashion. A Surgical Timeout confirmed our plan.   I made a transverse incision through the inferior umbilical fold.  I made a nick in the infraumbilical fascia and confirmed peritoneal entry.  I placed a stay suture and then the [facility] port.  We induced carbon dioxide insufflation.  Camera inspection revealed no injury.  I placed additional ports under direct laparoscopic visualization.  I mobilized the terminal ileum to proximal ascending colon in a lateral to medial fashion.  I took care to avoid injuring any retroperitoneal structures.   I freed the appendix off its attachments to the ascending colon and cecal mesentery.  I elevated the appendix.  I was able to free off the base of the appendix, which was still viable.  I took a healthy cuff viable cecum. I skeletonized & ligated the mesoappendix with a vascular load stapler.  I dived the remaining mesoappendix with a harmonic scalpel.   I stapled the appendix off the cecum using a laparoscopic purple load stapler.  I placed the appendix inside an EndoCatch bag and removed out the [place] port.  I did copious irrigation. Hemostasis was good in the mesoappendix, colon mesentery, and retroperitoneum. Staple line was intact on the cecum with no bleeding. I washed out the pelvis, retrohepatic space and right paracolic gutter.  Hemostasis is good. There was no perforation or injury remaining.  Because the area cleaned up well after irrigation, I did not place a drain.  I aspirated the carbon dioxide. I removed the ports. I closed the umbilical fascia site using a 0 Vicryl stitch. I closed skin using 4-0 vicryl stitch.  Sterile dressings were applied.  Patient  was extubated and sent to the recovery room.  I discussed the operative findings with the patient's girlfriend. I suspect the patient is going used in the hospital at least overnight and will need antibiotics for 0 days. Questions answered. They expressed understanding and  appreciation.

## 2019-07-03 NOTE — ED Provider Notes (Addendum)
Matthew Pace Provider Note   CSN: [id number] Arrival date & time: 07/03/19  1034     History Chief Complaint  Patient presents with  . Abdominal Pain    Matthew Pace is a 29 y.o. male.  The history is provided by the patient.  Abdominal Pain Pain location:  RLQ Pain quality: aching   Pain radiates to:  Does not radiate Pain severity:  Mild Onset quality:  Gradual Duration:  2 days Timing:  Constant Progression:  Unchanged Chronicity:  New Context: not previous surgeries and not sick contacts   Relieved by:  Nothing Worsened by:  Nothing Associated symptoms: fever, nausea and vomiting   Associated symptoms: no chest pain, no chills, no cough, no dysuria, no hematuria, no shortness of breath and no sore throat   Risk factors: has not had multiple surgeries        Past Medical History:  Diagnosis Date  . GERD (gastroesophageal reflux disease)     There are no problems to display for this patient.   History reviewed. No pertinent surgical history.     Family History  Problem Relation Age of Onset  . Diabetes Mother   . Hypertension Mother   . Diabetes Maternal Grandmother   . Stroke Maternal Grandmother   . Cancer Maternal Grandfather   . Heart disease Paternal Grandfather   . Stroke Paternal Grandfather     Social History   Tobacco Use  . Smoking status: Former Smoker    Packs/day: 1.00    Types: Cigarettes    Quit date: 12/31/2015    Years since quitting: 3.5  . Smokeless tobacco: Never Used  Substance Use Topics  . Alcohol use: Yes    Alcohol/week: 7.0 standard drinks    Types: 7 Standard drinks or equivalent per week  . Drug use: No    Home Medications Prior to Admission medications   Medication Sig Start Date End Date Taking? Authorizing Provider  calcium carbonate (TUMS - DOSED IN MG ELEMENTAL CALCIUM) 500 MG chewable tablet Chew 1 tablet by mouth daily.    [provider]  doxycycline  (VIBRA-TABS) 100 MG tablet Take 1 tablet (100 mg total) by mouth 2 (two) times daily. 02/05/18   Posey Boyer, MD  ibuprofen (ADVIL,MOTRIN) 200 MG tablet Take 200 mg by mouth every 6 (six) hours as needed.    [provider]    Allergies    Patient has no known allergies.  Review of Systems   Review of Systems  Constitutional: Positive for fever. Negative for chills.  HENT: Negative for ear pain and sore throat.   Eyes: Negative for pain and visual disturbance.  Respiratory: Negative for cough and shortness of breath.   Cardiovascular: Negative for chest pain and palpitations.  Gastrointestinal: Positive for abdominal pain, nausea and vomiting.  Genitourinary: Negative for dysuria and hematuria.  Musculoskeletal: Negative for arthralgias and back pain.  Skin: Negative for color change and rash.  Neurological: Negative for seizures and syncope.  All other systems reviewed and are negative.   Physical Exam Updated Vital Signs  ED Triage Vitals  Enc Vitals Group     BP 07/03/19 1050 133/80     Pulse Rate 07/03/19 1050 (!) 120     Resp 07/03/19 1050 (!) 24     Temp 07/03/19 1050 (!) 103 F (39.4 C)     Temp Source 07/03/19 1050 Oral     SpO2 07/03/19 1050 99 %     Weight  07/03/19 1048 220 lb (99.8 kg)     Height 07/03/19 1048 5\' 10"  (1.778 m)     Head Circumference --      Peak Flow --      Pain Score 07/03/19 1049 6     Pain Loc --      Pain Edu? --      Excl. in GC? --     Physical Exam Vitals and nursing note reviewed.  Constitutional:      General: He is not in acute distress.    Appearance: He is well-developed. He is not ill-appearing.  HENT:     Head: Normocephalic and atraumatic.  Eyes:     Extraocular Movements: Extraocular movements intact.     Conjunctiva/sclera: Conjunctivae normal.  Cardiovascular:     Rate and Rhythm: Normal rate and regular rhythm.     Heart sounds: Normal heart sounds. No murmur.  Pulmonary:     Effort: Pulmonary  effort is normal. No respiratory distress.     Breath sounds: Normal breath sounds.  Abdominal:     General: There is no distension.     Palpations: Abdomen is soft.     Tenderness: There is abdominal tenderness in the right lower quadrant. There is rebound.  Genitourinary:    Penis: Normal.      Testes: Normal.        Right: Mass, tenderness or swelling not present.        Left: Mass, tenderness or swelling not present.  Musculoskeletal:     Cervical back: Neck supple.  Skin:    General: Skin is warm and dry.  Neurological:     Mental Status: He is alert.     ED Results / Procedures / Treatments   Labs (all labs ordered are listed, but only abnormal results are displayed) Labs Reviewed  COMPREHENSIVE METABOLIC PANEL - Abnormal; Notable for the following components:      Result Value   Glucose, Bld 106 (*)    Total Bilirubin 1.3 (*)    All other components within normal limits  CBC WITH DIFFERENTIAL/PLATELET - Abnormal; Notable for the following components:   WBC 15.5 (*)    Neutro Abs 13.5 (*)    Monocytes Absolute 1.1 (*)    All other components within normal limits  URINALYSIS, ROUTINE W REFLEX MICROSCOPIC - Abnormal; Notable for the following components:   pH 8.5 (*)    All other components within normal limits  RESPIRATORY PANEL BY RT PCR (FLU A&B, COVID)  CULTURE, BLOOD (ROUTINE X 2)  CULTURE, BLOOD (ROUTINE X 2)  URINE CULTURE  LACTIC ACID, PLASMA  APTT  PROTIME-INR  LACTIC ACID, PLASMA    EKG None  Radiology CT ABDOMEN PELVIS W CONTRAST  Result Date: 07/03/2019 CLINICAL DATA:  Abdominal distension RIGHT lower quadrant pain. Vomiting. EXAM: CT ABDOMEN AND PELVIS WITH CONTRAST TECHNIQUE: Multidetector CT imaging of the abdomen and pelvis was performed using the standard protocol following bolus administration of intravenous contrast. CONTRAST:  07/05/2019 OMNIPAQUE IOHEXOL 300 MG/ML  SOLN COMPARISON:  None. FINDINGS: Lower chest: Patchy ground-glass opacities  within the lingula. Hepatobiliary: No focal liver abnormality is seen. No gallstones, gallbladder wall thickening, or biliary dilatation. Pancreas: Unremarkable. No pancreatic ductal dilatation or surrounding inflammatory changes. Spleen: Normal in size without focal abnormality. Adrenals/Urinary Tract: Adrenal glands appear normal. 3 mm nonobstructing LEFT renal stone. Kidneys are otherwise unremarkable without mass, stone or hydronephrosis. No ureteral or bladder calculi identified. Bladder is unremarkable, partially decompressed. Stomach/Bowel: Appendix: Location:  RIGHT lower quadrant Diameter: 1.6 cm Appendicolith: 1 appendicolith near the base of the appendix measures 7 mm. A second smaller appendicolith near the distal tip. Mucosal hyper-enhancement: Questionable Extraluminal gas: None seen Periappendiceal collection: Fluid stranding without discrete collection/abscess. Reactive thickening of the walls of the cecum. No dilated large or small bowel loops. Stomach is unremarkable, partially decompressed. Vascular/Lymphatic: Reactive lymphadenopathy in the RIGHT lower quadrant. No vascular abnormality. Reproductive: Prostate is unremarkable. Other: No abscess collection or free intraperitoneal air. Musculoskeletal: No acute or suspicious osseous finding. Superficial soft tissues are unremarkable. IMPRESSION: 1. Acute appendicitis. Appendix is distended to 1.6 cm. There are at least 2 appendicoliths, the largest stone measuring 7 mm near the base of the appendix. Significant amount of periappendiceal inflammation/fluid stranding. No abscess collection seen. No free intraperitoneal air. 2. Patchy ground-glass opacities within the lingula. Differential includes atypical pneumonias such as viral or fungal, aspiration pneumonitis and respiratory bronchiolitis. COVID-19 pneumonia can have this appearance. 3. 3 mm nonobstructing LEFT renal stone. Electronically Signed   By: Bary Richard M.D.   On: 07/03/2019 13:02     Procedures .Critical Care Performed by: Virgina Norfolk, DO Authorized by: Virgina Norfolk, DO   Critical care provider statement:    Critical care time (minutes):  35   Critical care was necessary to treat or prevent imminent or life-threatening deterioration of the following conditions:  Sepsis   Critical care was time spent personally by me on the following activities:  Blood draw for specimens, development of treatment plan with patient or surrogate, discussions with consultants, evaluation of patient's response to treatment, obtaining history from patient or surrogate, ordering and performing treatments and interventions, ordering and review of laboratory studies, ordering and review of radiographic studies, review of old charts, re-evaluation of patient's condition and pulse oximetry   I assumed direction of critical care for this patient from another provider in my specialty: no     (including critical care time)  Medications Ordered in ED Medications  morphine 4 MG/ML injection 4 mg (4 mg Intravenous Refused 07/03/19 1137)  piperacillin-tazobactam (ZOSYN) IVPB 3.375 g (3.375 g Intravenous New Bag/Given 07/03/19 1136)  sodium chloride 0.9 % bolus 1,000 mL (1,000 mLs Intravenous New Bag/Given 07/03/19 1135)  ondansetron (ZOFRAN) injection 4 mg (4 mg Intravenous Given 07/03/19 1136)  acetaminophen (TYLENOL) tablet 650 mg (650 mg Oral Given 07/03/19 1136)  iohexol (OMNIPAQUE) 300 MG/ML solution 100 mL (100 mLs Intravenous Contrast Given 07/03/19 1220)    ED Course  I have reviewed the triage vital signs and the nursing notes.  Pertinent labs & imaging results that were available during my care of the patient were reviewed by me and considered in my medical decision making (see chart for details).    MDM Rules/Calculators/A&P                      Matthew Pace is a 29 year old male who presents to the ED with abdominal pain, fever.  Patient febrile and tachycardic upon arrival.   Lower abdominal pain since yesterday.  Pain now worse in the right lower quadrant.  Has had nausea and vomiting.  Patient mostly tender in the right lower quadrant on exam.  Concern for appendicitis.  Denies any diarrhea.  No testicular tenderness on examination.  No urinary symptoms.  Sepsis orders have been placed.  Empirically will give IV Zosyn as concern for appendicitis.  Will give IV fluids and Tylenol and Zofran.  Will evaluate with blood work, blood cultures, urinalysis,  CT abdomen pelvis.  White count of 15 but otherwise unremarkable labs.  CT scan shows appendicitis with no complications.  Patient feels better after IV fluids, pain medication.  Talked with Dr. Maisie Fus with general surgery who recommends ED to ED transfer to [facility] long as patient will go directly to the OR. [facility] ED made aware, page Dr. Maisie Fus upon patient arrival.  Dr. Juleen China from the ED aware patient.  Sepsis likely from appendicitis.  Hemodynamically stable throughout my care.  Tachycardia and fever have improved following IV fluids and Tylenol.  No concern for septic shock.  This chart was dictated using voice recognition software.  Despite best efforts to proofread,  errors can occur which can change the documentation meaning.   Final Clinical Impression(s) / ED Diagnoses Final diagnoses:  Acute appendicitis, unspecified acute appendicitis type    Rx / DC Orders ED Discharge Orders    None       Virgina Norfolk, DO 07/03/19 1317    Virgina Norfolk, DO 07/03/19 1330    Jazmon Kos, DO 07/03/19 1331

## 2019-07-03 NOTE — Anesthesia Postprocedure Evaluation (Signed)
Anesthesia Post Note  Patient: Matthew Pace  Procedure(s) Performed: APPENDECTOMY LAPAROSCOPIC (N/A Abdomen)     Patient location during evaluation: PACU Anesthesia Type: General Level of consciousness: awake Pain management: pain level controlled Respiratory status: spontaneous breathing Cardiovascular status: stable Postop Assessment: no apparent nausea or vomiting Anesthetic complications: no    Last Vitals:  Vitals:   07/03/19 1830 07/03/19 1845  BP: (!) 142/71 (!) 151/73  Pulse: (!) 112 (!) 118  Resp: (!) 21 17  Temp:    SpO2: 97% 99%    Last Pain:  Vitals:   07/03/19 1830  TempSrc:   PainSc: Asleep                 Analese Sovine

## 2019-07-04 ENCOUNTER — Other Ambulatory Visit: Payer: Self-pay

## 2019-07-04 ENCOUNTER — Emergency Department (HOSPITAL_COMMUNITY): Payer: BC Managed Care – PPO

## 2019-07-04 ENCOUNTER — Inpatient Hospital Stay (HOSPITAL_COMMUNITY)
Admission: EM | Admit: 2019-07-04 | Discharge: 2019-07-16 | DRG: 338 | Disposition: A | Discharge status: Home / Self Care | Payer: BC Managed Care – PPO | Attending: General Surgery | Admitting: General Surgery

## 2019-07-04 DIAGNOSIS — Z809 Family history of malignant neoplasm, unspecified: Secondary | ICD-10-CM

## 2019-07-04 DIAGNOSIS — T8143XA Infection following a procedure, organ and space surgical site, initial encounter: Secondary | ICD-10-CM | POA: Diagnosis not present

## 2019-07-04 DIAGNOSIS — Z823 Family history of stroke: Secondary | ICD-10-CM

## 2019-07-04 DIAGNOSIS — R1084 Generalized abdominal pain: Secondary | ICD-10-CM

## 2019-07-04 DIAGNOSIS — R0602 Shortness of breath: Secondary | ICD-10-CM

## 2019-07-04 DIAGNOSIS — Z8249 Family history of ischemic heart disease and other diseases of the circulatory system: Secondary | ICD-10-CM

## 2019-07-04 DIAGNOSIS — E43 Unspecified severe protein-calorie malnutrition: Secondary | ICD-10-CM | POA: Diagnosis present

## 2019-07-04 DIAGNOSIS — K219 Gastro-esophageal reflux disease without esophagitis: Secondary | ICD-10-CM | POA: Diagnosis present

## 2019-07-04 DIAGNOSIS — R509 Fever, unspecified: Secondary | ICD-10-CM

## 2019-07-04 DIAGNOSIS — R55 Syncope and collapse: Secondary | ICD-10-CM | POA: Diagnosis present

## 2019-07-04 DIAGNOSIS — J9811 Atelectasis: Secondary | ICD-10-CM | POA: Diagnosis present

## 2019-07-04 DIAGNOSIS — Z20822 Contact with and (suspected) exposure to covid-19: Secondary | ICD-10-CM | POA: Diagnosis present

## 2019-07-04 DIAGNOSIS — Z79899 Other long term (current) drug therapy: Secondary | ICD-10-CM

## 2019-07-04 DIAGNOSIS — Z833 Family history of diabetes mellitus: Secondary | ICD-10-CM

## 2019-07-04 DIAGNOSIS — F17210 Nicotine dependence, cigarettes, uncomplicated: Secondary | ICD-10-CM | POA: Diagnosis present

## 2019-07-04 DIAGNOSIS — K651 Peritoneal abscess: Secondary | ICD-10-CM

## 2019-07-04 DIAGNOSIS — R188 Other ascites: Secondary | ICD-10-CM | POA: Diagnosis present

## 2019-07-04 DIAGNOSIS — K567 Ileus, unspecified: Secondary | ICD-10-CM | POA: Diagnosis not present

## 2019-07-04 DIAGNOSIS — R000 Tachycardia, unspecified: Secondary | ICD-10-CM | POA: Diagnosis present

## 2019-07-04 DIAGNOSIS — K3533 Acute appendicitis with perforation and localized peritonitis, with abscess: Principal | ICD-10-CM | POA: Diagnosis present

## 2019-07-04 LAB — COMPREHENSIVE METABOLIC PANEL
ALT: 16 U/L (ref 0–44)
AST: 24 U/L (ref 15–41)
Albumin: 3.4 g/dL — ABNORMAL LOW (ref 3.5–5.0)
Alkaline Phosphatase: 66 U/L (ref 38–126)
Anion gap: 10 (ref 5–15)
BUN: 15 mg/dL (ref 6–20)
CO2: 23 mmol/L (ref 22–32)
Calcium: 9.3 mg/dL (ref 8.9–10.3)
Chloride: 102 mmol/L (ref 98–111)
Creatinine, Ser: 0.99 mg/dL (ref 0.61–1.24)
GFR calc Af Amer: >60 mL/min (ref >60)
GFR calc non Af Amer: >60 mL/min (ref >60)
Glucose, Bld: 124 mg/dL — ABNORMAL HIGH (ref 70–99)
Potassium: 3.9 mmol/L (ref 3.5–5.1)
Sodium: 135 mmol/L (ref 135–145)
Total Bilirubin: 1.4 mg/dL — ABNORMAL HIGH (ref 0.3–1.2)
Total Protein: 7.4 g/dL (ref 6.5–8.1)

## 2019-07-04 LAB — URINE CULTURE: Culture: NO GROWTH

## 2019-07-04 LAB — URINALYSIS, ROUTINE W REFLEX MICROSCOPIC
Bacteria, UA: NONE SEEN
Bilirubin Urine: NEGATIVE
Glucose, UA: NEGATIVE mg/dL
Ketones, ur: 20 mg/dL — AB
Leukocytes,Ua: NEGATIVE
Nitrite: NEGATIVE
Protein, ur: 100 mg/dL — AB
Specific Gravity, Urine: 1.026 (ref 1.005–1.030)
pH: 7 (ref 5.0–8.0)

## 2019-07-04 LAB — LACTIC ACID, PLASMA: Lactic Acid, Venous: 1.1 mmol/L (ref 0.5–1.9)

## 2019-07-04 LAB — PROTIME-INR
INR: 1.4 — ABNORMAL HIGH (ref 0.8–1.2)
Prothrombin Time: 17.1 seconds — ABNORMAL HIGH (ref 11.4–15.2)

## 2019-07-04 MED ORDER — OXYCODONE HCL 5 MG PO TABS: 5.0000 mg | ORAL_TABLET | Freq: Four times a day (QID) | ORAL | Status: DC | PRN

## 2019-07-04 MED ORDER — ACETAMINOPHEN 500 MG PO TABS: 1000.0000 mg | ORAL_TABLET | Freq: Four times a day (QID) | ORAL | 0 refills | Status: DC

## 2019-07-04 MED ORDER — PANTOPRAZOLE SODIUM 40 MG PO TBEC: 40.0000 mg | DELAYED_RELEASE_TABLET | Freq: Every day | ORAL | Status: DC

## 2019-07-04 MED ORDER — AMOXICILLIN-POT CLAVULANATE 875-125 MG PO TABS: 1.0000 | ORAL_TABLET | Freq: Two times a day (BID) | ORAL | 0 refills | Status: DC

## 2019-07-04 MED ORDER — OXYCODONE HCL 5 MG PO TABS: 5.0000 mg | ORAL_TABLET | Freq: Four times a day (QID) | ORAL | 0 refills | Status: DC | PRN

## 2019-07-04 MED ORDER — ALUM & MAG HYDROXIDE-SIMETH 200-200-20 MG/5ML PO SUSP
15.0000 mL | ORAL | Status: DC | PRN
  Administered 2019-07-04: 15 mL via ORAL
  Filled 2019-07-04: qty 30

## 2019-07-04 NOTE — Discharge Instructions (Signed)
please arrive at least 30 min before your follow up appointment to complete your check in paperwork.  If you are unable to arrive 30 min prior to your appointment time we may have to cancel or reschedule you.  LAPAROSCOPIC SURGERY: POST OP INSTRUCTIONS  1. DIET: Follow a light bland diet the first 24 hours after arrival home, such as soup, liquids, crackers, etc. Be sure to include lots of fluids daily. Avoid fast food or heavy meals as your are more likely to get nauseated. Eat a low fat the next few days after surgery.  2. Take your usually prescribed home medications unless otherwise directed. 3. PAIN CONTROL:  1. Pain is best controlled by a usual combination of three different methods TOGETHER:  1. Ice/Heat 2. Over the counter pain medication 3. Prescription pain medication 2. Most patients will experience some swelling and bruising around the incisions. Ice packs or heating pads (30-60 minutes up to 6 times a day) will help. Use ice for the first few days to help decrease swelling and bruising, then switch to heat to help relax tight/sore spots and speed recovery. Some people prefer to use ice alone, heat alone, alternating between ice & heat. Experiment to what works for you. Swelling and bruising can take several weeks to resolve.  3. It is helpful to take an over-the-counter pain medication regularly for the first few weeks. Choose one of the following that works best for you:  1. Naproxen (Aleve, etc) Two 220mg  tabs twice a day 2. Ibuprofen (Advil, etc) Three 200mg  tabs four times a day (every meal & bedtime) 3. Acetaminophen (Tylenol, etc) 500-650mg  four times a day (every meal & bedtime) 4. A prescription for pain medication (such as oxycodone, hydrocodone, etc) should be given to you upon discharge. Take your pain medication as prescribed.  1. If you are having problems/concerns with the prescription medicine (does not control pain, nausea, vomiting, rash, itching, etc), please call us  [phone] to see if we need to switch you to a different pain medicine that will work better for you and/or control your side effect better. 2. If you need a refill on your pain medication, please contact your pharmacy. They will contact our office to request authorization. Prescriptions will not be filled after 5 pm or on week-ends. 4. Avoid getting constipated. Between the surgery and the pain medications, it is common to experience some constipation. Increasing fluid intake and taking a fiber supplement (such as Metamucil, Citrucel, FiberCon, MiraLax, etc) 1-2 times a day regularly will usually help prevent this problem from occurring. A mild laxative (prune juice, Milk of Magnesia, MiraLax, etc) should be taken according to package directions if there are no bowel movements after 48 hours.  5. Watch out for diarrhea. If you have many loose bowel movements, simplify your diet to bland foods & liquids for a few days. Stop any stool softeners and decrease your fiber supplement. Switching to mild anti-diarrheal medications (Kayopectate, Pepto Bismol) can help. If this worsens or does not improve, please call us. 6. Wash / shower every day. You may shower over the dressings as they are waterproof. Continue to shower over incision(s) after the dressing is off. 7. Remove your waterproof bandages 5 days after surgery. You may leave the incision open to air. You may replace a dressing/Band-Aid to cover the incision for comfort if you wish.  8. ACTIVITIES as tolerated:  1. You may resume regular (light) daily activities beginning the next day--such as daily self-care, walking,  climbing stairs--gradually increasing activities as tolerated. If you can walk 30 minutes without difficulty, it is safe to try more intense activity such as jogging, treadmill, bicycling, low-impact aerobics, swimming, etc. 2. Save the most intensive and strenuous activity for last such as sit-ups, heavy lifting, contact sports, etc  Refrain from any heavy lifting or straining until you are off narcotics for pain control.  3. DO NOT PUSH THROUGH PAIN. Let pain be your guide: If it hurts to do something, don't do it. Pain is your body warning you to avoid that activity for another week until the pain goes down. 4. You may drive when you are no longer taking prescription pain medication, you can comfortably wear a seatbelt, and you can safely maneuver your car and apply brakes. 5. You may have sexual intercourse when it is comfortable.  9. FOLLOW UP in our office  1. Please call [facility] at [phone] to set up an appointment to see your surgeon in the office for a follow-up appointment approximately 2-3 weeks after your surgery. 2. Make sure that you call for this appointment the day you arrive home to insure a convenient appointment time.      10. IF YOU HAVE DISABILITY OR FAMILY LEAVE FORMS, BRING THEM TO THE               OFFICE FOR PROCESSING.   WHEN TO CALL us [phone]:  1. Poor pain control 2. Reactions / problems with new medications (rash/itching, nausea, etc)  3. Fever over 101.5 F (38.5 C) 4. Inability to urinate 5. Nausea and/or vomiting 6. Worsening swelling or bruising 7. Continued bleeding from incision. 8. Increased pain, redness, or drainage from the incision  The clinic staff is available to answer your questions during regular business hours (8:30am-5pm). Please don't hesitate to call and ask to speak to one of our nurses for clinical concerns.  If you have a medical emergency, go to the nearest emergency room or call 911.  A surgeon from [facility] is always on call at the [facility], [place]  [address], [place], [place] [address] ?  MAIN: [phone] ? TOLL FREE: [phone] ?  FAX [phone]  [email or web address]

## 2019-07-04 NOTE — Discharge Summary (Signed)
[facility] Discharge Summary   Patient ID: Matthew Pace MRN: [id number] DOB/AGE: 08/05/1990 29 y.o.  Admit date: 07/03/2019 Discharge date: 07/04/2019  Discharge Diagnosis Patient Active Problem List   Diagnosis Date Noted  . Perforated appendicitis 07/03/2019    Consultants None   Imaging: CT ABDOMEN PELVIS W CONTRAST  Result Date: 07/03/2019 CLINICAL DATA:  Abdominal distension RIGHT lower quadrant pain. Vomiting. EXAM: CT ABDOMEN AND PELVIS WITH CONTRAST TECHNIQUE: Multidetector CT imaging of the abdomen and pelvis was performed using the standard protocol following bolus administration of intravenous contrast. CONTRAST:  OMNIPAQUE IOHEXOL 300 MG/ML  SOLN COMPARISON:  None. FINDINGS: Lower chest: Patchy ground-glass opacities within the lingula. Hepatobiliary: No focal liver abnormality is seen. No gallstones, gallbladder wall thickening, or biliary dilatation. Pancreas: Unremarkable. No pancreatic ductal dilatation or surrounding inflammatory changes. Spleen: Normal in size without focal abnormality. Adrenals/Urinary Tract: Adrenal glands appear normal. 3 mm nonobstructing LEFT renal stone. Kidneys are otherwise unremarkable without mass, stone or hydronephrosis. No ureteral or bladder calculi identified. Bladder is unremarkable, partially decompressed. Stomach/Bowel: Appendix: Location: RIGHT lower quadrant Diameter: 1.6 cm Appendicolith: 1 appendicolith near the base of the appendix measures 7 mm. A second smaller appendicolith near the distal tip. Mucosal hyper-enhancement: Questionable Extraluminal gas: None seen Periappendiceal collection: Fluid stranding without discrete collection/abscess. Reactive thickening of the walls of the cecum. No dilated large or small bowel loops. Stomach is unremarkable, partially decompressed. Vascular/Lymphatic: Reactive lymphadenopathy in the RIGHT lower quadrant. No vascular abnormality. Reproductive: Prostate is unremarkable. Other:  No abscess collection or free intraperitoneal air. Musculoskeletal: No acute or suspicious osseous finding. Superficial soft tissues are unremarkable. IMPRESSION: 1. Acute appendicitis. Appendix is distended to 1.6 cm. There are at least 2 appendicoliths, the largest stone measuring 7 mm near the base of the appendix. Significant amount of periappendiceal inflammation/fluid stranding. No abscess collection seen. No free intraperitoneal air. 2. Patchy ground-glass opacities within the lingula. Differential includes atypical pneumonias such as viral or fungal, aspiration pneumonitis and respiratory bronchiolitis. COVID-19 pneumonia can have this appearance. 3. 3 mm nonobstructing LEFT renal stone. Electronically Signed   By: Bary Richard M.D.   On: 07/03/2019 13:02    Procedures Dr. Romie Levee (07/03/19) - Laparoscopic Appendectomy  HPI:  Matthew Pace a 29 year old male who presented to [facility] ED with abdominal pain, fever and nausea. Patient febrile and tachycardic upon arrival. Lower abdominal pain 24 hours. Pain now worse in the right lower quadrant. Has had nausea and vomiting.  H/o Covid infection 4 months ago.  Otherwise healthy.   Hospital Course:  Workup revealed acute appendicitis (see above CT scan). Patient was admitted and underwent procedure listed above.  Tolerated procedure well and was transferred to the floor.  Diet was advanced as tolerated.  On POD#1, the patient was voiding well, tolerating diet, ambulating well, pain well controlled, vital signs stable, incisions c/d/i and felt stable for discharge home.  Patient will follow up in our office in 2 weeks and knows to call with questions or concerns.  He will call to confirm appointment date/time.  Work note provided  Physical Exam: General:  Alert, NAD, pleasant, comfortable Abd:  Soft, ND, mild tenderness, incisions C/D/I  Allergies as of 07/04/2019   No Known Allergies     Medication List    TAKE these medications    acetaminophen 500 MG tablet Commonly known as: TYLENOL Take 2 tablets (1,000 mg total) by mouth every 6 (six) hours.   amoxicillin-clavulanate 875-125 MG tablet Commonly known as: Augmentin  Take 1 tablet by mouth 2 (two) times daily for 5 days.   Dexilant 60 MG capsule Generic drug: dexlansoprazole Take 1 capsule by mouth daily.   oxyCODONE 5 MG immediate release tablet Commonly known as: Oxy IR/ROXICODONE Take 1 tablet (5 mg total) by mouth every 6 (six) hours as needed for severe pain (not relieved by tylenol or ibuprofen).        Follow-up [facility], [place]. Go in 2 week(s).   Specialty: General Surgery Why: Our office is scheduling you for a post-operative follow up appointment. please call to confirm appointment date/time. please arrive 20-30 minutes early and bring photo ID/insurance info. Contact information: [address] [place] [phone]          Signed: Obie Dredge, [facility] 07/04/2019, 2:41 PM

## 2019-07-04 NOTE — ED Triage Notes (Signed)
Pt reports discharged this afternoon after having his appendectomy.  Since then he has been vomitng and fever.  He is concerned for infection.

## 2019-07-04 NOTE — Progress Notes (Signed)
Pt arrived to unit via stretcher room 1513 from PACU in yellow MEWS due to HR sustaining in 120's. MD aware. Pt alert and oriented x4. Pain 4/10, c/o nausea but has subsided. Oriented to room and callbell with no complications. IT consultant reviewed pt history. Will continue to monitor. Initial assessment completed. Pt guide at the bedside

## 2019-07-04 NOTE — Progress Notes (Signed)
   07/04/19 0022  Vitals  Temp 99.5 F (37.5 C)  Temp Source Oral  BP 134/78  MAP (mmHg) 95  BP Location Left Arm  BP Method Automatic  Patient Position (if appropriate) Lying  Pulse Rate (!) 117  Pulse Rate Source Monitor  Resp 20  Oxygen Therapy  SpO2 100 %  MEWS Score  MEWS Temp 0  MEWS Systolic 0  MEWS Pulse 2  MEWS RR 0  MEWS LOC 0  MEWS Score 2  MEWS Score Color Yellow  Pt continues to be in Yellow MEWS d/t HR. Pain medication provided. Pt at rest at this time

## 2019-07-05 ENCOUNTER — Encounter (HOSPITAL_COMMUNITY): Payer: Self-pay | Admitting: Radiology

## 2019-07-05 ENCOUNTER — Emergency Department (HOSPITAL_COMMUNITY): Payer: BC Managed Care – PPO

## 2019-07-05 DIAGNOSIS — Z79899 Other long term (current) drug therapy: Secondary | ICD-10-CM | POA: Diagnosis not present

## 2019-07-05 DIAGNOSIS — F17210 Nicotine dependence, cigarettes, uncomplicated: Secondary | ICD-10-CM | POA: Diagnosis present

## 2019-07-05 DIAGNOSIS — Z8249 Family history of ischemic heart disease and other diseases of the circulatory system: Secondary | ICD-10-CM | POA: Diagnosis not present

## 2019-07-05 DIAGNOSIS — R000 Tachycardia, unspecified: Secondary | ICD-10-CM | POA: Diagnosis present

## 2019-07-05 DIAGNOSIS — Z20822 Contact with and (suspected) exposure to covid-19: Secondary | ICD-10-CM | POA: Diagnosis present

## 2019-07-05 DIAGNOSIS — R188 Other ascites: Secondary | ICD-10-CM | POA: Diagnosis present

## 2019-07-05 DIAGNOSIS — K567 Ileus, unspecified: Secondary | ICD-10-CM | POA: Diagnosis not present

## 2019-07-05 DIAGNOSIS — R509 Fever, unspecified: Secondary | ICD-10-CM | POA: Diagnosis present

## 2019-07-05 DIAGNOSIS — R55 Syncope and collapse: Secondary | ICD-10-CM | POA: Diagnosis present

## 2019-07-05 DIAGNOSIS — Z833 Family history of diabetes mellitus: Secondary | ICD-10-CM | POA: Diagnosis not present

## 2019-07-05 DIAGNOSIS — K219 Gastro-esophageal reflux disease without esophagitis: Secondary | ICD-10-CM | POA: Diagnosis present

## 2019-07-05 DIAGNOSIS — E43 Unspecified severe protein-calorie malnutrition: Secondary | ICD-10-CM | POA: Diagnosis present

## 2019-07-05 DIAGNOSIS — J9811 Atelectasis: Secondary | ICD-10-CM | POA: Diagnosis present

## 2019-07-05 DIAGNOSIS — T8143XA Infection following a procedure, organ and space surgical site, initial encounter: Secondary | ICD-10-CM | POA: Diagnosis not present

## 2019-07-05 DIAGNOSIS — Z823 Family history of stroke: Secondary | ICD-10-CM | POA: Diagnosis not present

## 2019-07-05 DIAGNOSIS — Z809 Family history of malignant neoplasm, unspecified: Secondary | ICD-10-CM | POA: Diagnosis not present

## 2019-07-05 DIAGNOSIS — K3533 Acute appendicitis with perforation and localized peritonitis, with abscess: Secondary | ICD-10-CM | POA: Diagnosis present

## 2019-07-05 LAB — CBC WITH DIFFERENTIAL/PLATELET
Abs Immature Granulocytes: 0.11 10*3/uL — ABNORMAL HIGH (ref 0.00–0.07)
Basophils Absolute: 0 10*3/uL (ref 0.0–0.1)
Basophils Relative: 0 %
Eosinophils Absolute: 0 10*3/uL (ref 0.0–0.5)
Eosinophils Relative: 0 %
HCT: 44.7 % (ref 39.0–52.0)
Hemoglobin: 15.6 g/dL (ref 13.0–17.0)
Immature Granulocytes: 1 %
Lymphocytes Relative: 6 %
Lymphs Abs: 1 10*3/uL (ref 0.7–4.0)
MCH: 30.7 pg (ref 26.0–34.0)
MCHC: 34.9 g/dL (ref 30.0–36.0)
MCV: 88 fL (ref 80.0–100.0)
Monocytes Absolute: 1.3 10*3/uL — ABNORMAL HIGH (ref 0.1–1.0)
Monocytes Relative: 7 %
Neutro Abs: 15.5 10*3/uL — ABNORMAL HIGH (ref 1.7–7.7)
Neutrophils Relative %: 86 %
Platelets: 195 10*3/uL (ref 150–400)
RBC: 5.08 MIL/uL (ref 4.22–5.81)
RDW: 11.9 % (ref 11.5–15.5)
WBC: 17.9 10*3/uL — ABNORMAL HIGH (ref 4.0–10.5)
nRBC: 0 % (ref 0.0–0.2)

## 2019-07-05 LAB — SURGICAL PATHOLOGY

## 2019-07-05 LAB — SARS CORONAVIRUS 2 (TAT 6-24 HRS): SARS Coronavirus 2: NEGATIVE

## 2019-07-05 LAB — HIV ANTIBODY (ROUTINE TESTING W REFLEX): HIV Screen 4th Generation wRfx: NONREACTIVE

## 2019-07-05 MED ORDER — VANCOMYCIN HCL 2000 MG/400ML IV SOLN
2000.0000 mg | Freq: Once | INTRAVENOUS | Status: DC
  Filled 2019-07-05: qty 400

## 2019-07-05 MED ORDER — ACETAMINOPHEN 500 MG PO TABS
1000.0000 mg | ORAL_TABLET | Freq: Once | ORAL | Status: AC
  Administered 2019-07-05: 1000 mg via ORAL
  Filled 2019-07-05: qty 2

## 2019-07-05 MED ORDER — SODIUM CHLORIDE 0.9 % IV SOLN: INTRAVENOUS | Status: DC

## 2019-07-05 MED ORDER — DIPHENHYDRAMINE HCL 50 MG/ML IJ SOLN: 25.0000 mg | Freq: Four times a day (QID) | INTRAMUSCULAR | Status: DC | PRN

## 2019-07-05 MED ORDER — METOPROLOL TARTRATE 5 MG/5ML IV SOLN
5.0000 mg | Freq: Four times a day (QID) | INTRAVENOUS | Status: DC | PRN
  Administered 2019-07-05: 13:00:00 5 mg via INTRAVENOUS
  Filled 2019-07-05: qty 5

## 2019-07-05 MED ORDER — ONDANSETRON HCL 4 MG/2ML IJ SOLN
4.0000 mg | Freq: Four times a day (QID) | INTRAMUSCULAR | Status: DC | PRN
  Administered 2019-07-05 – 2019-07-10 (×5): 4 mg via INTRAVENOUS
  Filled 2019-07-05 (×5): qty 2

## 2019-07-05 MED ORDER — MORPHINE SULFATE (PF) 4 MG/ML IV SOLN
4.0000 mg | Freq: Once | INTRAVENOUS | Status: AC
  Administered 2019-07-05: 4 mg via INTRAVENOUS
  Filled 2019-07-05: qty 1

## 2019-07-05 MED ORDER — IBUPROFEN 400 MG PO TABS
600.0000 mg | ORAL_TABLET | Freq: Four times a day (QID) | ORAL | Status: DC | PRN
  Administered 2019-07-05 – 2019-07-07 (×3): 600 mg via ORAL
  Filled 2019-07-05 (×3): qty 1

## 2019-07-05 MED ORDER — IOHEXOL 9 MG/ML PO SOLN
500.0000 mL | ORAL | Status: AC
  Administered 2019-07-05 (×2): 500 mL via ORAL

## 2019-07-05 MED ORDER — DOCUSATE SODIUM 100 MG PO CAPS
100.0000 mg | ORAL_CAPSULE | Freq: Two times a day (BID) | ORAL | Status: DC
  Administered 2019-07-05 – 2019-07-16 (×4): 100 mg via ORAL
  Filled 2019-07-05 (×15): qty 1

## 2019-07-05 MED ORDER — PIPERACILLIN-TAZOBACTAM 3.375 G IVPB
3.3750 g | Freq: Once | INTRAVENOUS | Status: AC
  Administered 2019-07-05: 07:00:00 3.375 g via INTRAVENOUS
  Filled 2019-07-05: qty 50

## 2019-07-05 MED ORDER — PROMETHAZINE HCL 25 MG/ML IJ SOLN
12.5000 mg | Freq: Four times a day (QID) | INTRAMUSCULAR | Status: DC | PRN
  Administered 2019-07-05: 25 mg via INTRAVENOUS
  Administered 2019-07-06: 12.5 mg via INTRAVENOUS
  Administered 2019-07-07: 25 mg via INTRAVENOUS
  Filled 2019-07-05 (×3): qty 1

## 2019-07-05 MED ORDER — METHOCARBAMOL 500 MG PO TABS: 500.0000 mg | ORAL_TABLET | Freq: Four times a day (QID) | ORAL | Status: DC | PRN

## 2019-07-05 MED ORDER — OXYCODONE HCL 5 MG PO TABS: 5.0000 mg | ORAL_TABLET | ORAL | Status: DC | PRN

## 2019-07-05 MED ORDER — ONDANSETRON 4 MG PO TBDP: 4.0000 mg | ORAL_TABLET | Freq: Four times a day (QID) | ORAL | Status: DC | PRN

## 2019-07-05 MED ORDER — ACETAMINOPHEN 500 MG PO TABS
1000.0000 mg | ORAL_TABLET | Freq: Four times a day (QID) | ORAL | Status: DC
  Administered 2019-07-05 – 2019-07-07 (×7): 1000 mg via ORAL
  Filled 2019-07-05 (×9): qty 2

## 2019-07-05 MED ORDER — VANCOMYCIN HCL IN DEXTROSE 1-5 GM/200ML-% IV SOLN: 1000.0000 mg | Freq: Once | INTRAVENOUS | Status: DC

## 2019-07-05 MED ORDER — MORPHINE SULFATE (PF) 2 MG/ML IV SOLN
2.0000 mg | INTRAVENOUS | Status: DC | PRN
  Administered 2019-07-05 (×2): 2 mg via INTRAVENOUS
  Filled 2019-07-05 (×2): qty 1

## 2019-07-05 MED ORDER — POLYETHYLENE GLYCOL 3350 17 G PO PACK: 17.0000 g | PACK | Freq: Every day | ORAL | Status: DC | PRN

## 2019-07-05 MED ORDER — VANCOMYCIN HCL 1750 MG/350ML IV SOLN
1750.0000 mg | Freq: Two times a day (BID) | INTRAVENOUS | Status: DC
  Administered 2019-07-05 – 2019-07-09 (×9): 1750 mg via INTRAVENOUS
  Filled 2019-07-05 (×10): qty 350

## 2019-07-05 MED ORDER — SODIUM CHLORIDE 0.9 % IV BOLUS
1000.0000 mL | Freq: Once | INTRAVENOUS | Status: AC
  Administered 2019-07-05: 03:00:00 1000 mL via INTRAVENOUS

## 2019-07-05 MED ORDER — ONDANSETRON HCL 4 MG/2ML IJ SOLN
4.0000 mg | Freq: Once | INTRAMUSCULAR | Status: AC
  Administered 2019-07-05: 05:00:00 4 mg via INTRAVENOUS

## 2019-07-05 MED ORDER — IOHEXOL 9 MG/ML PO SOLN
ORAL | Status: AC
  Filled 2019-07-05: qty 1000

## 2019-07-05 MED ORDER — SIMETHICONE 80 MG PO CHEW
40.0000 mg | CHEWABLE_TABLET | Freq: Four times a day (QID) | ORAL | Status: DC | PRN
  Filled 2019-07-05 (×2): qty 1

## 2019-07-05 MED ORDER — DIPHENHYDRAMINE HCL 25 MG PO CAPS: 25.0000 mg | ORAL_CAPSULE | Freq: Four times a day (QID) | ORAL | Status: DC | PRN

## 2019-07-05 MED ORDER — ENOXAPARIN SODIUM 40 MG/0.4ML ~~LOC~~ SOLN
40.0000 mg | Freq: Every day | SUBCUTANEOUS | Status: DC
  Administered 2019-07-05 – 2019-07-13 (×9): 40 mg via SUBCUTANEOUS
  Filled 2019-07-05 (×9): qty 0.4

## 2019-07-05 MED ORDER — IOHEXOL 300 MG/ML  SOLN
100.0000 mL | Freq: Once | INTRAMUSCULAR | Status: AC | PRN
  Administered 2019-07-05: 100 mL via INTRAVENOUS

## 2019-07-05 MED ORDER — PIPERACILLIN-TAZOBACTAM 3.375 G IVPB
3.3750 g | Freq: Three times a day (TID) | INTRAVENOUS | Status: DC
  Administered 2019-07-05 – 2019-07-16 (×33): 3.375 g via INTRAVENOUS
  Filled 2019-07-05 (×33): qty 50

## 2019-07-05 MED ORDER — MORPHINE SULFATE (PF) 4 MG/ML IV SOLN
4.0000 mg | Freq: Once | INTRAVENOUS | Status: AC
  Administered 2019-07-05: 05:00:00 4 mg via INTRAVENOUS
  Filled 2019-07-05: qty 1

## 2019-07-05 MED ORDER — ONDANSETRON HCL 4 MG/2ML IJ SOLN
4.0000 mg | Freq: Once | INTRAMUSCULAR | Status: DC
  Filled 2019-07-05: qty 2

## 2019-07-05 MED ORDER — VANCOMYCIN HCL 1750 MG/350ML IV SOLN: 1750.0000 mg | Freq: Two times a day (BID) | INTRAVENOUS | Status: DC

## 2019-07-05 MED ORDER — SODIUM CHLORIDE 0.9 % IV BOLUS
1000.0000 mL | Freq: Once | INTRAVENOUS | Status: AC
  Administered 2019-07-05: 1000 mL via INTRAVENOUS

## 2019-07-05 MED ORDER — METOPROLOL TARTRATE 5 MG/5ML IV SOLN
5.0000 mg | Freq: Four times a day (QID) | INTRAVENOUS | Status: DC | PRN
  Administered 2019-07-06: 5 mg via INTRAVENOUS
  Filled 2019-07-05: qty 5

## 2019-07-05 NOTE — H&P (Addendum)
Matthew Pace 31-Mar-1990  [id number].    Requesting MD: Dr. Elesa Massed Chief Complaint/Reason for Consult: S/p Lap Appy with abscess  HPI: Matthew Pace is a 29 y.o. male who was admitted on 4/18 with acute appendicitis.  He was taken to the operating room on 4/18 by Dr. Maisie Fus where he was found to have perforated appendicitis (contained) and underwent laparoscopic appendectomy.  Patient was doing well and was discharged home on 4/19 with 5 days of Augmentin.  He reports after arriving home he began having worsening right lower quadrant abdominal pain as well as constant nausea, 2 episodes of nonbilious, nonbloody emesis and a fever to 102.  He reports he was not able to hold any liquids or medications down so he presented to the ED.  There he was found to have fever of 102.4 and elevated heart rate to 138.  White blood cell count uptrending to 17.9 from 15.5. CT showed  3.1 x 3.0 x 2.2 cm gas and fluid collection at the operative site compatible with a developing abscess as well as a developing ileus. He is passing flatus. No BM since 4/18. He is no longer nauseated after getting zofran.   ROS: Review of Systems  Constitutional: Positive for chills and fever.  Respiratory: Negative for cough and shortness of breath.   Cardiovascular: Negative for chest pain and palpitations.  Gastrointestinal: Positive for abdominal pain, constipation, nausea and vomiting.  Genitourinary: Negative for dysuria.  All other systems reviewed and are negative.   Family History  Problem Relation Age of Onset  . Diabetes Mother   . Hypertension Mother   . Diabetes Maternal Grandmother   . Stroke Maternal Grandmother   . Cancer Maternal Grandfather   . Heart disease Paternal Grandfather   . Stroke Paternal Grandfather     Past Medical History:  Diagnosis Date  . GERD (gastroesophageal reflux disease)     Past Surgical History:  Procedure Laterality Date  . LAPAROSCOPIC APPENDECTOMY N/A 07/03/2019    Procedure: APPENDECTOMY LAPAROSCOPIC;  Surgeon: Romie Levee, MD;  Location: [facility];  Service: General;  Laterality: N/A;    Social History:  reports that he quit smoking about 3 years ago. His smoking use included cigarettes. He smoked 1.00 pack per day. He has never used smokeless tobacco. He reports current alcohol use of about 7.0 standard drinks of alcohol per week. He reports that he does not use drugs.  Allergies: No Known Allergies  (Not in a hospital admission)    Physical Exam: Blood pressure 136/77, pulse (!) 109, temperature 99.2 F (37.3 C), temperature source Oral, resp. rate 18, height 5\' 10"  (1.778 m), weight 99.8 kg, SpO2 95 %. General: pleasant, WD/WN white male who is laying in bed in NAD HEENT: head is normocephalic, atraumatic.  Sclera are noninjected.  PERRL.  Ears and nose without any masses or lesions.  Mouth is pink and moist. Dentition fair Heart: Tachycardic with regular rhythm.  Normal s1,s2. No obvious murmurs, gallops, or rubs noted.  Palpable pedal pulses bilaterally  Lungs: CTAB, no wheezes, rhonchi, or rales noted.  Respiratory effort nonlabored Abd: Soft, mild distension, tenderness of the RLQ and suprapubic abdomen without r/r/g. Slightly hypoactive BS. No masses, hernias, or organomegaly. Incisions with glue intact appears well and are without drainage, bleeding, or signs of infection. MS: no BUE/BLE edema, calves soft and nontender Skin: warm and dry with no masses, lesions, or rashes Psych: A&Ox4 with an appropriate affect Neuro: cranial nerves grossly intact, equal strength  in BUE/BLE bilaterally, normal speech, though process intact  Results for orders placed or performed during the hospital encounter of 07/04/19 (from the past 48 hour(s))  Comprehensive metabolic panel     Status: Abnormal   Collection Time: 07/04/19 10:29 PM  Result Value Ref Range   Sodium 135 135 - 145 mmol/L   Potassium 3.9 3.5 - 5.1 mmol/L   Chloride 102 98 - 111 mmol/L    CO2 23 22 - 32 mmol/L   Glucose, Bld 124 (H) 70 - 99 mg/dL    Comment: Glucose reference range applies only to samples taken after fasting for at least 8 hours.   BUN 15 6 - 20 mg/dL   Creatinine, Ser 1.01 0.61 - 1.24 mg/dL   Calcium 9.3 8.9 - 75.1 mg/dL   Total Protein 7.4 6.5 - 8.1 g/dL   Albumin 3.4 (L) 3.5 - 5.0 g/dL   AST 24 15 - 41 U/L   ALT 16 0 - 44 U/L   Alkaline Phosphatase 66 38 - 126 U/L   Total Bilirubin 1.4 (H) 0.3 - 1.2 mg/dL   GFR calc non Af Amer >60 >60 mL/min   GFR calc Af Amer >60 >60 mL/min   Anion gap 10 5 - 15    Comment: Performed at [facility], [address], [place], [place] [address]  Lactic acid, plasma     Status: None   Collection Time: 07/04/19 10:29 PM  Result Value Ref Range   Lactic Acid, Venous 1.1 0.5 - 1.9 mmol/L    Comment: Performed at [facility], [address] [place], [place] [address]  CBC with Differential     Status: Abnormal   Collection Time: 07/04/19 10:29 PM  Result Value Ref Range   WBC 17.9 (H) 4.0 - 10.5 K/uL   RBC 5.08 4.22 - 5.81 MIL/uL   Hemoglobin 15.6 13.0 - 17.0 g/dL   HCT 42.3 53.6 - 14.4 %   MCV 88.0 80.0 - 100.0 fL   MCH 30.7 26.0 - 34.0 pg   MCHC 34.9 30.0 - 36.0 g/dL   RDW 31.5 40.0 - 86.7 %   Platelets 195 150 - 400 K/uL   nRBC 0.0 0.0 - 0.2 %   Neutrophils Relative % 86 %   Neutro Abs 15.5 (H) 1.7 - 7.7 K/uL   Lymphocytes Relative 6 %   Lymphs Abs 1.0 0.7 - 4.0 K/uL   Monocytes Relative 7 %   Monocytes Absolute 1.3 (H) 0.1 - 1.0 K/uL   Eosinophils Relative 0 %   Eosinophils Absolute 0.0 0.0 - 0.5 K/uL   Basophils Relative 0 %   Basophils Absolute 0.0 0.0 - 0.1 K/uL   Immature Granulocytes 1 %   Abs Immature Granulocytes 0.11 (H) 0.00 - 0.07 K/uL    Comment: Performed at [facility], [address] [place], [place] [address]  Protime-INR     Status: Abnormal   Collection Time: 07/04/19 10:29 PM  Result Value Ref Range   Prothrombin Time 17.1 (H) 11.4 - 15.2 seconds   INR 1.4  (H) 0.8 - 1.2    Comment: (NOTE) INR goal varies based on device and disease states. Performed at [facility], [address], [place], [place] [address]   Urinalysis, Routine w reflex microscopic     Status: Abnormal   Collection Time: 07/04/19 10:40 PM  Result Value Ref Range   Color, Urine YELLOW YELLOW   APPearance CLEAR CLEAR  Specific Gravity, Urine 1.026 1.005 - 1.030   pH 7.0 5.0 - 8.0   Glucose, UA NEGATIVE NEGATIVE mg/dL   Hgb urine dipstick SMALL (A) NEGATIVE   Bilirubin Urine NEGATIVE NEGATIVE   Ketones, ur 20 (A) NEGATIVE mg/dL   Protein, ur 295 (A) NEGATIVE mg/dL   Nitrite NEGATIVE NEGATIVE   Leukocytes,Ua NEGATIVE NEGATIVE   RBC / HPF 0-5 0 - 5 RBC/hpf   WBC, UA 0-5 0 - 5 WBC/hpf   Bacteria, UA NONE SEEN NONE SEEN   Mucus PRESENT     Comment: Performed at [facility], [address] [place], [place] [address]   DG Chest 2 View  Result Date: 07/04/2019 CLINICAL DATA:  29 year old male with sepsis. EXAM: CHEST - 2 VIEW COMPARISON:  None. FINDINGS: Right mid lung field and left lung base linear atelectasis/scarring. No consolidative changes. There is no pleural effusion pneumothorax. The cardiac silhouette is within normal limits. No acute osseous pathology. IMPRESSION: No active cardiopulmonary disease. Electronically Signed   By: Elgie Collard M.D.   On: 07/04/2019 22:50   CT ABDOMEN PELVIS W CONTRAST  Result Date: 07/05/2019 CLINICAL DATA:  Pain, unspecified. Emergency appendectomy 2 days ago with persistent fever and pain. EXAM: CT ABDOMEN AND PELVIS WITH CONTRAST TECHNIQUE: Multidetector CT imaging of the abdomen and pelvis was performed using the standard protocol following bolus administration of intravenous contrast. CONTRAST:  OMNIPAQUE IOHEXOL 300 MG/ML  SOLN COMPARISON:  CT of the abdomen and pelvis 07/03/2018 FINDINGS: Lower chest: Bibasilar airspace opacities likely reflects atelectasis. Heart size is normal. No significant pleural or  pericardial effusion is present. Hepatobiliary: No focal liver abnormality is seen. No gallstones, gallbladder wall thickening, or biliary dilatation. Pancreas: Unremarkable. No pancreatic ductal dilatation or surrounding inflammatory changes. Spleen: Normal in size without focal abnormality. Adrenals/Urinary Tract: The adrenal glands are normal. Kidneys and ureters are within normal limits. 3 mm nonobstructing left lower pole stone is again seen. No obstruction is present. The urinary bladder is within normal limits. Stomach/Bowel: Stomach mildly distended. Contrast is present within a distended distal esophagus. Duodenal ulcer again noted. Diffuse mesenteric stranding is present. Is wall thickening in the terminal ileum. Appendectomy is noted. Fluid collection at the operative site demonstrates gas fluid level. The collection measures 3.1 x 3.0 x 2.2 cm. Inflammatory changes are present about the cecum and proximal ascending colon. More distal ascending colon and transverse colon are within limits. The descending and sigmoid colon are normal. Vascular/Lymphatic: Reactive lymph nodes are present within the mesentery. No significant retroperitoneal adenopathy is present. Reproductive: Prostate is unremarkable. Musculoskeletal: Vertebral body heights and alignment are maintained. Pelvis is normal. Hips are located and within normal limits. IMPRESSION: 1. 3.1 x 3.0 x 2.2 cm gas and fluid collection at the operative site compatible with a developing abscess. 2. Inflammatory changes about the cecum and proximal ascending colon may be postoperative. Wall thickening is present in the terminal ileum with developing ileus. 3. Reactive lymph nodes within the mesentery. 4. Stable chronic duodenal ulcer. 5. Bibasilar airspace disease likely reflects atelectasis. 6. Stable 3 mm nonobstructing stone at the lower pole of the left kidney. Electronically Signed   By: Marin Roberts M.D.   On: 07/05/2019 05:58   CT ABDOMEN  PELVIS W CONTRAST  Result Date: 07/03/2019 CLINICAL DATA:  Abdominal distension RIGHT lower quadrant pain. Vomiting. EXAM: CT ABDOMEN AND PELVIS WITH CONTRAST TECHNIQUE: Multidetector CT imaging of the abdomen and pelvis was performed using the standard protocol following bolus administration  of intravenous contrast. CONTRAST:  174mL OMNIPAQUE IOHEXOL 300 MG/ML  SOLN COMPARISON:  None. FINDINGS: Lower chest: Patchy ground-glass opacities within the lingula. Hepatobiliary: No focal liver abnormality is seen. No gallstones, gallbladder wall thickening, or biliary dilatation. Pancreas: Unremarkable. No pancreatic ductal dilatation or surrounding inflammatory changes. Spleen: Normal in size without focal abnormality. Adrenals/Urinary Tract: Adrenal glands appear normal. 3 mm nonobstructing LEFT renal stone. Kidneys are otherwise unremarkable without mass, stone or hydronephrosis. No ureteral or bladder calculi identified. Bladder is unremarkable, partially decompressed. Stomach/Bowel: Appendix: Location: RIGHT lower quadrant Diameter: 1.6 cm Appendicolith: 1 appendicolith near the base of the appendix measures 7 mm. A second smaller appendicolith near the distal tip. Mucosal hyper-enhancement: Questionable Extraluminal gas: None seen Periappendiceal collection: Fluid stranding without discrete collection/abscess. Reactive thickening of the walls of the cecum. No dilated large or small bowel loops. Stomach is unremarkable, partially decompressed. Vascular/Lymphatic: Reactive lymphadenopathy in the RIGHT lower quadrant. No vascular abnormality. Reproductive: Prostate is unremarkable. Other: No abscess collection or free intraperitoneal air. Musculoskeletal: No acute or suspicious osseous finding. Superficial soft tissues are unremarkable. IMPRESSION: 1. Acute appendicitis. Appendix is distended to 1.6 cm. There are at least 2 appendicoliths, the largest stone measuring 7 mm near the base of the appendix. Significant  amount of periappendiceal inflammation/fluid stranding. No abscess collection seen. No free intraperitoneal air. 2. Patchy ground-glass opacities within the lingula. Differential includes atypical pneumonias such as viral or fungal, aspiration pneumonitis and respiratory bronchiolitis. COVID-19 pneumonia can have this appearance. 3. 3 mm nonobstructing LEFT renal stone. Electronically Signed   By: Franki Cabot M.D.   On: 07/03/2019 13:02    Anti-infectives (From admission, onward)   Start     Dose/Rate Route Frequency Ordered Stop   07/05/19 1800  vancomycin (VANCOREADY) IVPB 1750 mg/350 mL     1,750 mg 175 mL/hr over 120 Minutes Intravenous Every 12 hours 07/05/19 0635     07/05/19 1500  piperacillin-tazobactam (ZOSYN) IVPB 3.375 g     3.375 g 12.5 mL/hr over 240 Minutes Intravenous Every 8 hours 07/05/19 0635     07/05/19 0630  vancomycin (VANCOREADY) IVPB 2000 mg/400 mL     2,000 mg 200 mL/hr over 120 Minutes Intravenous  Once 07/05/19 0621     07/05/19 0615  vancomycin (VANCOCIN) IVPB 1000 mg/200 mL premix  Status:  Discontinued     1,000 mg 200 mL/hr over 60 Minutes Intravenous  Once 07/05/19 0607 07/05/19 0621   07/05/19 0615  piperacillin-tazobactam (ZOSYN) IVPB 3.375 g     3.375 g 12.5 mL/hr over 240 Minutes Intravenous  Once 07/05/19 0630         Assessment/Plan S/p Laparoscopic Appendectomy for perforated appendicitis (contained) - Dr. Marcello Moores 4/18  Post-Op Intra-abdominal fluid collection Developing reactive ileus - Fluid collection does not appear amenable to IR drainage - Continue IV abx - Bowel rest, IVF - Admit to observation  FEN - NPO/Sips, IVF VTE - SCDs, Lovenox ID - Zosyn 4/20 >>  Jillyn Ledger, [facility] 07/05/2019, 7:30 AM Please see Amion for pager number during day hours 7:00am-4:30pm

## 2019-07-05 NOTE — Progress Notes (Signed)
Pharmacy Antibiotic Note  Matthew Pace is a 29 y.o. male admitted on 07/04/2019 with intraabdominal infection with recent hospitalization.  Pharmacy has been consulted for Vanc dosing.  Plan: Vanc 1750 mg IV q12hr Monitor renal function, clinical status and vanc levels as indicated.  Height: 5\' 10"  (177.8 cm) Weight: 99.8 kg (220 lb) IBW/kg (Calculated) : 73  Temp (24hrs), Avg:100.6 F (38.1 C), Min:98.7 F (37.1 C), Max:103.2 F (39.6 C)  Recent Labs  Lab 07/03/19 1126 07/03/19 1343 07/04/19 2229  WBC 15.5*  --  17.9*  CREATININE 0.99  --  0.99  LATICACIDVEN 1.1 0.9 1.1    Estimated Creatinine Clearance: 132.7 mL/min (by C-G formula based on SCr of 0.99 mg/dL).    No Known Allergies  Antimicrobials this admission: Vanc 4/20 >> Zosyn 4/20 >>  Thank you for allowing pharmacy to be a part of this patient's care.  5/20, PharmD, [facility] Clinical Pharmacist Please see AMION for all Pharmacists' Contact Phone Numbers 07/05/2019, 9:46 PM

## 2019-07-05 NOTE — ED Notes (Signed)
Attempted report x1. 

## 2019-07-05 NOTE — ED Notes (Signed)
Attempted report x 2 

## 2019-07-05 NOTE — ED Notes (Signed)
PA said to not ambulate pt.

## 2019-07-05 NOTE — Progress Notes (Signed)
Patient ID: Matthew Pace, male   DOB: Jun 23, 1990, 29 y.o.   MRN: [id number] Tachycardic and febrile, ct reviewed, bcx ngtd, will add vanc due to recent hospitalization, abdomen is minimally tender, certainly doesn't appear to need surgery. I discussed this with him and his father via phone

## 2019-07-05 NOTE — ED Provider Notes (Signed)
[facility] EMERGENCY DEPARTMENT Provider Note   CSN: [id number] Arrival date & time: 07/04/19  2151     History Chief Complaint  Patient presents with  . Post-op Problem  . Fever    Matthew Pace is a 29 y.o. male past no history of GERD who is status post appendectomy on 07/03/2019 who presents for evaluation of fever, nausea/vomiting.  He is here with partner who states that he was discharged yesterday.  They report that when he got home, he started having a fever of 101.5 as well as vomiting.  He reports diffuse abdominal pain.  He reports that he has not been able to tolerate much p.o. since discharge.  He denies any blood in the vomit.  He reports that he has not had a bowel movement since surgery.  He does state that he has been passing gas.  He has not noticed any drainage from the wound sites.  He has had some cough and and associated shortness of breath since he surgery but states that he feels like everything is sore and it causes him to have to slow down when he speaks.  He does report that he had Covid in January 2021.  Denies any chest pain at this time.  He states he has had some decreased urination but no difficulty urinating.  No blood in the urine.  He is taking his antibiotics.  The history is provided by the patient.       Past Medical History:  Diagnosis Date  . GERD (gastroesophageal reflux disease)     Patient Active Problem List   Diagnosis Date Noted  . Perforated appendicitis 07/03/2019    Past Surgical History:  Procedure Laterality Date  . LAPAROSCOPIC APPENDECTOMY N/A 07/03/2019   Procedure: APPENDECTOMY LAPAROSCOPIC;  Surgeon: Leighton Ruff, MD;  Location: [facility]S;  Service: General;  Laterality: N/A;       Family History  Problem Relation Age of Onset  . Diabetes Mother   . Hypertension Mother   . Diabetes Maternal Grandmother   . Stroke Maternal Grandmother   . Cancer Maternal Grandfather   . Heart disease Paternal  Grandfather   . Stroke Paternal Grandfather     Social History   Tobacco Use  . Smoking status: Former Smoker    Packs/day: 1.00    Types: Cigarettes    Quit date: 12/31/2015    Years since quitting: 3.5  . Smokeless tobacco: Never Used  Substance Use Topics  . Alcohol use: Yes    Alcohol/week: 7.0 standard drinks    Types: 7 Standard drinks or equivalent per week  . Drug use: No    Home Medications Prior to Admission medications   Medication Sig Start Date End Date Taking? Authorizing Provider  acetaminophen (TYLENOL) 500 MG tablet Take 2 tablets (1,000 mg total) by mouth every 6 (six) hours. 07/04/19  Yes SimaanDarci Current, PA-C  amoxicillin-clavulanate (AUGMENTIN) 875-125 MG tablet Take 1 tablet by mouth 2 (two) times daily for 5 days. 07/04/19 07/09/19 Yes Saverio Danker, PA-C  DEXILANT 60 MG capsule Take 1 capsule by mouth daily. 07/03/19  Yes [provider]  ondansetron (ZOFRAN) 4 MG tablet Take 4 mg by mouth every 6 (six) hours as needed. 07/04/19  Yes [provider]  oxyCODONE (OXY IR/ROXICODONE) 5 MG immediate release tablet Take 1 tablet (5 mg total) by mouth every 6 (six) hours as needed for severe pain (not relieved by tylenol or ibuprofen). 07/04/19  Yes Jill Alexanders,  PA-C    Allergies    Patient has no known allergies.  Review of Systems   Review of Systems  Constitutional: Positive for fever.  Respiratory: Positive for cough and shortness of breath.   Cardiovascular: Negative for chest pain.  Gastrointestinal: Positive for abdominal pain, nausea and vomiting.  Genitourinary: Negative for dysuria and hematuria.  Neurological: Negative for headaches.  All other systems reviewed and are negative.   Physical Exam Updated Vital Signs BP 132/84   Pulse (!) 131   Temp 99.2 F (37.3 C) (Oral)   Resp (!) 23   Ht 5\' 10"  (1.778 m)   Wt 99.8 kg   SpO2 97%   BMI 31.57 kg/m   Physical Exam Vitals and nursing note reviewed.   Constitutional:      Appearance: Normal appearance. He is well-developed.  HENT:     Head: Normocephalic and atraumatic.  Eyes:     General: Lids are normal.     Conjunctiva/sclera: Conjunctivae normal.     Pupils: Pupils are equal, round, and reactive to light.  Cardiovascular:     Rate and Rhythm: Regular rhythm. Tachycardia present.     Pulses: Normal pulses.          Radial pulses are 2+ on the right side and 2+ on the left side.     Heart sounds: Normal heart sounds. No murmur. No friction rub. No gallop.   Pulmonary:     Effort: Pulmonary effort is normal.     Breath sounds: Normal breath sounds.     Comments: Lungs clear to auscultation bilaterally.  Symmetric chest rise.  No wheezing, rales, rhonchi. Abdominal:     Palpations: Abdomen is soft. Abdomen is not rigid.     Tenderness: There is generalized abdominal tenderness. There is no guarding.     Comments: Trochanter incision scars noted to the umbilicus and the left lower quadrant without any overlying warmth, erythema, active drainage.  Generalized abdominal tenderness.  No rigidity, guarding.  Musculoskeletal:        General: Normal range of motion.     Cervical back: Full passive range of motion without pain.  Skin:    General: Skin is warm and dry.     Capillary Refill: Capillary refill takes less than 2 seconds.  Neurological:     Mental Status: He is alert and oriented to person, place, and time.  Psychiatric:        Speech: Speech normal.     ED Results / Procedures / Treatments   Labs (all labs ordered are listed, but only abnormal results are displayed) Labs Reviewed  COMPREHENSIVE METABOLIC PANEL - Abnormal; Notable for the following components:      Result Value   Glucose, Bld 124 (*)    Albumin 3.4 (*)    Total Bilirubin 1.4 (*)    All other components within normal limits  CBC WITH DIFFERENTIAL/PLATELET - Abnormal; Notable for the following components:   WBC 17.9 (*)    Neutro Abs 15.5 (*)     Monocytes Absolute 1.3 (*)    Abs Immature Granulocytes 0.11 (*)    All other components within normal limits  PROTIME-INR - Abnormal; Notable for the following components:   Prothrombin Time 17.1 (*)    INR 1.4 (*)    All other components within normal limits  URINALYSIS, ROUTINE W REFLEX MICROSCOPIC - Abnormal; Notable for the following components:   Hgb urine dipstick SMALL (*)    Ketones, ur 20 (*)  Protein, ur 100 (*)    All other components within normal limits  CULTURE, BLOOD (ROUTINE X 2)  CULTURE, BLOOD (ROUTINE X 2)  SARS CORONAVIRUS 2 (TAT 6-24 HRS)  LACTIC ACID, PLASMA    EKG None  Radiology DG Chest 2 View  Result Date: 07/04/2019 CLINICAL DATA:  29 year old male with sepsis. EXAM: CHEST - 2 VIEW COMPARISON:  None. FINDINGS: Right mid lung field and left lung base linear atelectasis/scarring. No consolidative changes. There is no pleural effusion pneumothorax. The cardiac silhouette is within normal limits. No acute osseous pathology. IMPRESSION: No active cardiopulmonary disease. Electronically Signed   By: Elgie CollardArash  Radparvar M.D.   On: 07/04/2019 22:50   CT ABDOMEN PELVIS W CONTRAST  Result Date: 07/05/2019 CLINICAL DATA:  Pain, unspecified. Emergency appendectomy 2 days ago with persistent fever and pain. EXAM: CT ABDOMEN AND PELVIS WITH CONTRAST TECHNIQUE: Multidetector CT imaging of the abdomen and pelvis was performed using the standard protocol following bolus administration of intravenous contrast. CONTRAST:  100mL OMNIPAQUE IOHEXOL 300 MG/ML  SOLN COMPARISON:  CT of the abdomen and pelvis 07/03/2018 FINDINGS: Lower chest: Bibasilar airspace opacities likely reflects atelectasis. Heart size is normal. No significant pleural or pericardial effusion is present. Hepatobiliary: No focal liver abnormality is seen. No gallstones, gallbladder wall thickening, or biliary dilatation. Pancreas: Unremarkable. No pancreatic ductal dilatation or surrounding inflammatory changes.  Spleen: Normal in size without focal abnormality. Adrenals/Urinary Tract: The adrenal glands are normal. Kidneys and ureters are within normal limits. 3 mm nonobstructing left lower pole stone is again seen. No obstruction is present. The urinary bladder is within normal limits. Stomach/Bowel: Stomach mildly distended. Contrast is present within a distended distal esophagus. Duodenal ulcer again noted. Diffuse mesenteric stranding is present. Is wall thickening in the terminal ileum. Appendectomy is noted. Fluid collection at the operative site demonstrates gas fluid level. The collection measures 3.1 x 3.0 x 2.2 cm. Inflammatory changes are present about the cecum and proximal ascending colon. More distal ascending colon and transverse colon are within limits. The descending and sigmoid colon are normal. Vascular/Lymphatic: Reactive lymph nodes are present within the mesentery. No significant retroperitoneal adenopathy is present. Reproductive: Prostate is unremarkable. Musculoskeletal: Vertebral body heights and alignment are maintained. Pelvis is normal. Hips are located and within normal limits. IMPRESSION: 1. 3.1 x 3.0 x 2.2 cm gas and fluid collection at the operative site compatible with a developing abscess. 2. Inflammatory changes about the cecum and proximal ascending colon may be postoperative. Wall thickening is present in the terminal ileum with developing ileus. 3. Reactive lymph nodes within the mesentery. 4. Stable chronic duodenal ulcer. 5. Bibasilar airspace disease likely reflects atelectasis. 6. Stable 3 mm nonobstructing stone at the lower pole of the left kidney. Electronically Signed   By: Marin Robertshristopher  Mattern M.D.   On: 07/05/2019 05:58   CT ABDOMEN PELVIS W CONTRAST  Result Date: 07/03/2019 CLINICAL DATA:  Abdominal distension RIGHT lower quadrant pain. Vomiting. EXAM: CT ABDOMEN AND PELVIS WITH CONTRAST TECHNIQUE: Multidetector CT imaging of the abdomen and pelvis was performed using  the standard protocol following bolus administration of intravenous contrast. CONTRAST:  100mL OMNIPAQUE IOHEXOL 300 MG/ML  SOLN COMPARISON:  None. FINDINGS: Lower chest: Patchy ground-glass opacities within the lingula. Hepatobiliary: No focal liver abnormality is seen. No gallstones, gallbladder wall thickening, or biliary dilatation. Pancreas: Unremarkable. No pancreatic ductal dilatation or surrounding inflammatory changes. Spleen: Normal in size without focal abnormality. Adrenals/Urinary Tract: Adrenal glands appear normal. 3 mm nonobstructing LEFT renal stone. Kidneys  are otherwise unremarkable without mass, stone or hydronephrosis. No ureteral or bladder calculi identified. Bladder is unremarkable, partially decompressed. Stomach/Bowel: Appendix: Location: RIGHT lower quadrant Diameter: 1.6 cm Appendicolith: 1 appendicolith near the base of the appendix measures 7 mm. A second smaller appendicolith near the distal tip. Mucosal hyper-enhancement: Questionable Extraluminal gas: None seen Periappendiceal collection: Fluid stranding without discrete collection/abscess. Reactive thickening of the walls of the cecum. No dilated large or small bowel loops. Stomach is unremarkable, partially decompressed. Vascular/Lymphatic: Reactive lymphadenopathy in the RIGHT lower quadrant. No vascular abnormality. Reproductive: Prostate is unremarkable. Other: No abscess collection or free intraperitoneal air. Musculoskeletal: No acute or suspicious osseous finding. Superficial soft tissues are unremarkable. IMPRESSION: 1. Acute appendicitis. Appendix is distended to 1.6 cm. There are at least 2 appendicoliths, the largest stone measuring 7 mm near the base of the appendix. Significant amount of periappendiceal inflammation/fluid stranding. No abscess collection seen. No free intraperitoneal air. 2. Patchy ground-glass opacities within the lingula. Differential includes atypical pneumonias such as viral or fungal, aspiration  pneumonitis and respiratory bronchiolitis. COVID-19 pneumonia can have this appearance. 3. 3 mm nonobstructing LEFT renal stone. Electronically Signed   By: Bary Richard M.D.   On: 07/03/2019 13:02    Procedures Procedures (including critical care time)  Medications Ordered in ED Medications  ondansetron (ZOFRAN) injection 4 mg (4 mg Intravenous Not Given 07/05/19 0225)  piperacillin-tazobactam (ZOSYN) IVPB 3.375 g (has no administration in time range)  vancomycin (VANCOREADY) IVPB 2000 mg/400 mL (has no administration in time range)  piperacillin-tazobactam (ZOSYN) IVPB 3.375 g (has no administration in time range)  vancomycin (VANCOREADY) IVPB 1750 mg/350 mL (has no administration in time range)  sodium chloride 0.9 % bolus 1,000 mL (1,000 mLs Intravenous Bolus from Bag 07/05/19 0222)  morphine 4 MG/ML injection 4 mg (4 mg Intravenous Given 07/05/19 0222)  iohexol (OMNIPAQUE) 9 MG/ML oral solution 500 mL (500 mLs Oral Contrast Given 07/05/19 0342)  iohexol (OMNIPAQUE) 9 MG/ML oral solution (  Contrast Given 07/05/19 0345)  sodium chloride 0.9 % bolus 1,000 mL (0 mLs Intravenous Stopped 07/05/19 0343)  acetaminophen (TYLENOL) tablet 1,000 mg (1,000 mg Oral Given 07/05/19 0440)  morphine 4 MG/ML injection 4 mg (4 mg Intravenous Given 07/05/19 0516)  ondansetron (ZOFRAN) injection 4 mg (4 mg Intravenous Given 07/05/19 0513)  iohexol (OMNIPAQUE) 300 MG/ML solution 100 mL (100 mLs Intravenous Contrast Given 07/05/19 0518)    ED Course  I have reviewed the triage vital signs and the nursing notes.  Pertinent labs & imaging results that were available during my care of the patient were reviewed by me and considered in my medical decision making (see chart for details).    MDM Rules/Calculators/A&P                      29 year old male who presents for evaluation of abdominal pain and fever.  He is status postop 1 day from appendectomy.  Was discharged yesterday morning.  Went home and had fever of  101.5 and had persistent vomiting as well as abdominal pain.  On initially arrival, he is febrile, tachycardic.  Vitals otherwise stable.  He appears uncomfortable but no acute distress.  He has generalized abdominal tenderness.  Incision sites without any overlying warmth, erythema.  Labs ordered at triage.  We will plan for CT on pelvis for hydration of postop infection.  CBC shows leukocytosis of 17.9.  CMP shows normal potassium, BUN and creatinine.  Lactic is unremarkable.  UA negative for  any infectious etiology.  Chest x-ray showed no acute abnormalities.  CT scan shows 3 x3 by 2 cm collection of gas and fluid consistent with developing abscess.  He has inflammatory changes about the cecum and proximal ascending colon.  Patient started on broad-spectrum antibiotics.  Will consult GEN surg.   Discussed patient with Dr. Derrell Lolling (gen surg) who agrees for admission. Surgery team will evaluate in the ED.   Portions of this note were generated with Scientist, clinical (histocompatibility and immunogenetics). Dictation errors may occur despite best attempts at proofreading.   Final Clinical Impression(s) / ED Diagnoses Final diagnoses:  Fever, unspecified fever cause  Generalized abdominal pain  Intra-abdominal abscess [facility])    Rx / DC Orders ED Discharge Orders    None       Maxwell Caul, PA-C 07/05/19 0640    Ward, Layla Maw, DO 07/05/19 [phone]

## 2019-07-05 NOTE — ED Notes (Signed)
Patient transported to CT 

## 2019-07-05 NOTE — Progress Notes (Signed)
Pharmacy Antibiotic Note  Matthew Pace is a 29 y.o. male admitted on 07/04/2019 with wound infection.  Pharmacy has been consulted for vancomycin and zosyn dosing.  Plan: Vancomycin 2gm IV x 1 then 1750mg  IV q12 hours Zosyn 3.375 gm IV q8 hours F/u renal function, cultures and clinical course  Height: 5\' 10"  (177.8 cm) Weight: 99.8 kg (220 lb) IBW/kg (Calculated) : 73  Temp (24hrs), Avg:100.1 F (37.8 C), Min:98.5 F (36.9 C), Max:102.4 F (39.1 C)  Recent Labs  Lab 07/03/19 1126 07/03/19 1343 07/04/19 2229  WBC 15.5*  --  17.9*  CREATININE 0.99  --  0.99  LATICACIDVEN 1.1 0.9 1.1    Estimated Creatinine Clearance: 132.7 mL/min (by C-G formula based on SCr of 0.99 mg/dL).    No Known Allergies   Thank you for allowing pharmacy to be a part of this patient's care.  07/05/19 07/05/2019 6:32 AM

## 2019-07-06 ENCOUNTER — Encounter (HOSPITAL_COMMUNITY): Payer: Self-pay

## 2019-07-06 LAB — CBC
HCT: 38.7 % — ABNORMAL LOW (ref 39.0–52.0)
Hemoglobin: 13.4 g/dL (ref 13.0–17.0)
MCH: 30.3 pg (ref 26.0–34.0)
MCHC: 34.6 g/dL (ref 30.0–36.0)
MCV: 87.6 fL (ref 80.0–100.0)
Platelets: 183 10*3/uL (ref 150–400)
RBC: 4.42 MIL/uL (ref 4.22–5.81)
RDW: 11.9 % (ref 11.5–15.5)
WBC: 15 10*3/uL — ABNORMAL HIGH (ref 4.0–10.5)
nRBC: 0 % (ref 0.0–0.2)

## 2019-07-06 LAB — BASIC METABOLIC PANEL
Anion gap: 12 (ref 5–15)
BUN: 18 mg/dL (ref 6–20)
CO2: 22 mmol/L (ref 22–32)
Calcium: 8.5 mg/dL — ABNORMAL LOW (ref 8.9–10.3)
Chloride: 104 mmol/L (ref 98–111)
Creatinine, Ser: 1.12 mg/dL (ref 0.61–1.24)
GFR calc Af Amer: >60 mL/min (ref >60)
GFR calc non Af Amer: >60 mL/min (ref >60)
Glucose, Bld: 107 mg/dL — ABNORMAL HIGH (ref 70–99)
Potassium: 3.6 mmol/L (ref 3.5–5.1)
Sodium: 138 mmol/L (ref 135–145)

## 2019-07-06 MED ORDER — SODIUM CHLORIDE 0.9 % IV BOLUS
500.0000 mL | Freq: Once | INTRAVENOUS | Status: AC
  Administered 2019-07-06: 500 mL via INTRAVENOUS

## 2019-07-06 MED ORDER — PANTOPRAZOLE SODIUM 40 MG IV SOLR
40.0000 mg | INTRAVENOUS | Status: DC
  Administered 2019-07-06 – 2019-07-07 (×2): 40 mg via INTRAVENOUS
  Filled 2019-07-06 (×2): qty 40

## 2019-07-06 NOTE — Progress Notes (Addendum)
Subjective: CC: RLQ abdominal pain Patient noted to have a fever overnight. No sob or urinary symptoms. Complains of pain in his RLQ and around laparoscopic incisions that is currently a 3/10. Reports that yesterday he was nauseated and had one episode of emesis. Reports this morning his nausea has resolved and he has been tolerating sips with meds. He is passing flatus, no bm. Mobilized in room yesterday.   Patient reports that he has jury duty this morning and is asking for a note to excuse him.   Objective: Vital signs in last 24 hours: Temp:  [98.7 F (37.1 C)-103.2 F (39.6 C)] 100.3 F (37.9 C) (04/21 0605) Pulse Rate:  [102-139] 126 (04/21 0605) Resp:  [16-29] 16 (04/21 0605) BP: (123-137)/(66-92) 123/87 (04/21 0605) SpO2:  [95 %-98 %] 96 % (04/21 0605) Last BM Date: 07/05/19  Intake/Output from previous day: 04/20 0701 - 04/21 0700 In: 1652.6 [I.V.:1211.4; IV Piggyback:441.2] Out: 600 [Urine:600] Intake/Output this shift: No intake/output data recorded.  PE: Gen:  Alert, NAD, pleasant HEENT: EOM's intact, pupils equal and round Card:  Tachycardic with regular rhythm Pulm:  CTAB, no W/R/R, effort normal. Pulling 1250.  Abd: Soft, ND, tenderness of the suprapubic and RLQ without rebound, rigidity or guarding. +BS. Incisions with glue intact appears well and are without drainage, bleeding, or signs of infection Ext:  No LE edema  Psych: A&Ox3  Skin: no rashes noted, warm and dry  Lab Results:  Recent Labs    07/04/19 2229 07/06/19 0545  WBC 17.9* 15.0*  HGB 15.6 13.4  HCT 44.7 38.7*  PLT 195 183   BMET Recent Labs    07/04/19 2229 07/06/19 0545  NA 135 138  K 3.9 3.6  CL 102 104  CO2 23 22  GLUCOSE 124* 107*  BUN 15 18  CREATININE 0.99 1.12  CALCIUM 9.3 8.5*   PT/INR Recent Labs    07/03/19 1126 07/04/19 2229  LABPROT 13.6 17.1*  INR 1.1 1.4*   CMP     Component Value Date/Time   NA 138 07/06/2019 0545   K 3.6 07/06/2019 0545   CL  104 07/06/2019 0545   CO2 22 07/06/2019 0545   GLUCOSE 107 (H) 07/06/2019 0545   BUN 18 07/06/2019 0545   CREATININE 1.12 07/06/2019 0545   CALCIUM 8.5 (L) 07/06/2019 0545   PROT 7.4 07/04/2019 2229   ALBUMIN 3.4 (L) 07/04/2019 2229   AST 24 07/04/2019 2229   ALT 16 07/04/2019 2229   ALKPHOS 66 07/04/2019 2229   BILITOT 1.4 (H) 07/04/2019 2229   GFRNONAA >60 07/06/2019 0545   GFRAA >60 07/06/2019 0545   Lipase  No results found for: LIPASE     Studies/Results: DG Chest 2 View  Result Date: 07/04/2019 CLINICAL DATA:  29 year old male with sepsis. EXAM: CHEST - 2 VIEW COMPARISON:  None. FINDINGS: Right mid lung field and left lung base linear atelectasis/scarring. No consolidative changes. There is no pleural effusion pneumothorax. The cardiac silhouette is within normal limits. No acute osseous pathology. IMPRESSION: No active cardiopulmonary disease. Electronically Signed   By: Elgie Collard M.D.   On: 07/04/2019 22:50   CT ABDOMEN PELVIS W CONTRAST  Result Date: 07/05/2019 CLINICAL DATA:  Pain, unspecified. Emergency appendectomy 2 days ago with persistent fever and pain. EXAM: CT ABDOMEN AND PELVIS WITH CONTRAST TECHNIQUE: Multidetector CT imaging of the abdomen and pelvis was performed using the standard protocol following bolus administration of intravenous contrast. CONTRAST:  OMNIPAQUE IOHEXOL  300 MG/ML  SOLN COMPARISON:  CT of the abdomen and pelvis 07/03/2018 FINDINGS: Lower chest: Bibasilar airspace opacities likely reflects atelectasis. Heart size is normal. No significant pleural or pericardial effusion is present. Hepatobiliary: No focal liver abnormality is seen. No gallstones, gallbladder wall thickening, or biliary dilatation. Pancreas: Unremarkable. No pancreatic ductal dilatation or surrounding inflammatory changes. Spleen: Normal in size without focal abnormality. Adrenals/Urinary Tract: The adrenal glands are normal. Kidneys and ureters are within normal  limits. 3 mm nonobstructing left lower pole stone is again seen. No obstruction is present. The urinary bladder is within normal limits. Stomach/Bowel: Stomach mildly distended. Contrast is present within a distended distal esophagus. Duodenal ulcer again noted. Diffuse mesenteric stranding is present. Is wall thickening in the terminal ileum. Appendectomy is noted. Fluid collection at the operative site demonstrates gas fluid level. The collection measures 3.1 x 3.0 x 2.2 cm. Inflammatory changes are present about the cecum and proximal ascending colon. More distal ascending colon and transverse colon are within limits. The descending and sigmoid colon are normal. Vascular/Lymphatic: Reactive lymph nodes are present within the mesentery. No significant retroperitoneal adenopathy is present. Reproductive: Prostate is unremarkable. Musculoskeletal: Vertebral body heights and alignment are maintained. Pelvis is normal. Hips are located and within normal limits. IMPRESSION: 1. 3.1 x 3.0 x 2.2 cm gas and fluid collection at the operative site compatible with a developing abscess. 2. Inflammatory changes about the cecum and proximal ascending colon may be postoperative. Wall thickening is present in the terminal ileum with developing ileus. 3. Reactive lymph nodes within the mesentery. 4. Stable chronic duodenal ulcer. 5. Bibasilar airspace disease likely reflects atelectasis. 6. Stable 3 mm nonobstructing stone at the lower pole of the left kidney. Electronically Signed   By: San Morelle M.D.   On: 07/05/2019 05:58    Anti-infectives: Anti-infectives (From admission, onward)   Start     Dose/Rate Route Frequency Ordered Stop   07/05/19 2200  vancomycin (VANCOREADY) IVPB 1750 mg/350 mL     1,750 mg 175 mL/hr over 120 Minutes Intravenous Every 12 hours 07/05/19 2149     07/05/19 1800  vancomycin (VANCOREADY) IVPB 1750 mg/350 mL  Status:  Discontinued     1,750 mg 175 mL/hr over 120 Minutes Intravenous  Every 12 hours 07/05/19 0635 07/05/19 0745   07/05/19 1500  piperacillin-tazobactam (ZOSYN) IVPB 3.375 g     3.375 g 12.5 mL/hr over 240 Minutes Intravenous Every 8 hours 07/05/19 0635     07/05/19 0630  vancomycin (VANCOREADY) IVPB 2000 mg/400 mL  Status:  Discontinued     2,000 mg 200 mL/hr over 120 Minutes Intravenous  Once 07/05/19 0621 07/05/19 0805   07/05/19 0615  vancomycin (VANCOCIN) IVPB 1000 mg/200 mL premix  Status:  Discontinued     1,000 mg 200 mL/hr over 60 Minutes Intravenous  Once 07/05/19 0607 07/05/19 0621   07/05/19 0615  piperacillin-tazobactam (ZOSYN) IVPB 3.375 g     3.375 g 12.5 mL/hr over 240 Minutes Intravenous  Once 07/05/19 2841 07/05/19 1154       Assessment/Plan S/p Laparoscopic Appendectomy for perforated appendicitis (contained) - Dr. Marcello Moores 4/18  Post-Op Intra-abdominal fluid collection Reactive ileus - Fluid collection does not appear amenable to IR drainage - Continue IV abx. Vanc added overnight - Ileus resolving, Allow clears - Cont IVF, bolus for tachycardia - AM labs - Mobilize, IS  FEN - Clears, IVF, bolus VTE - SCDs, Lovenox ID - Zosyn/Vanc 4/20 >> Tmax 103.2, WBC down from 17.9 to 15.0  LOS: 1 day    Jacinto Halim , [facility] Surgery 07/06/2019, 7:59 AM Please see Amion for pager number during day hours 7:00am-4:30pm

## 2019-07-06 NOTE — Social Work (Signed)
CSW acknowledging consult for pt note for jury duty. [facility] PA note should be sufficient for pt excusal. RN Secretary should fax note when pt obtains fax number for courthouse where pt is expected for duty.   Octavio Graves, MSW, LCSW [facility] Clinical Social Work

## 2019-07-06 NOTE — Progress Notes (Signed)
MEWS score 2 due to HR of 110's, went in room to assess pt, pt reports having nausea with some vomiting, phenergan 12.5mg  given via iv, will cont to monitor.

## 2019-07-07 LAB — CBC
HCT: 36.6 % — ABNORMAL LOW (ref 39.0–52.0)
Hemoglobin: 12.7 g/dL — ABNORMAL LOW (ref 13.0–17.0)
MCH: 30.9 pg (ref 26.0–34.0)
MCHC: 34.7 g/dL (ref 30.0–36.0)
MCV: 89.1 fL (ref 80.0–100.0)
Platelets: 195 10*3/uL (ref 150–400)
RBC: 4.11 MIL/uL — ABNORMAL LOW (ref 4.22–5.81)
RDW: 12.3 % (ref 11.5–15.5)
WBC: 12.7 10*3/uL — ABNORMAL HIGH (ref 4.0–10.5)
nRBC: 0 % (ref 0.0–0.2)

## 2019-07-07 LAB — BASIC METABOLIC PANEL
Anion gap: 10 (ref 5–15)
BUN: 19 mg/dL (ref 6–20)
CO2: 22 mmol/L (ref 22–32)
Calcium: 8.5 mg/dL — ABNORMAL LOW (ref 8.9–10.3)
Chloride: 106 mmol/L (ref 98–111)
Creatinine, Ser: 1.03 mg/dL (ref 0.61–1.24)
GFR calc Af Amer: >60 mL/min (ref >60)
GFR calc non Af Amer: >60 mL/min (ref >60)
Glucose, Bld: 101 mg/dL — ABNORMAL HIGH (ref 70–99)
Potassium: 3.7 mmol/L (ref 3.5–5.1)
Sodium: 138 mmol/L (ref 135–145)

## 2019-07-07 MED ORDER — MORPHINE SULFATE (PF) 2 MG/ML IV SOLN
2.0000 mg | INTRAVENOUS | Status: DC | PRN
  Administered 2019-07-09 – 2019-07-11 (×5): 2 mg via INTRAVENOUS
  Filled 2019-07-07 (×5): qty 1

## 2019-07-07 MED ORDER — PANTOPRAZOLE SODIUM 40 MG IV SOLR
40.0000 mg | Freq: Two times a day (BID) | INTRAVENOUS | Status: DC
  Administered 2019-07-07 – 2019-07-16 (×18): 40 mg via INTRAVENOUS
  Filled 2019-07-07 (×19): qty 40

## 2019-07-07 MED ORDER — KETOROLAC TROMETHAMINE 30 MG/ML IJ SOLN
15.0000 mg | Freq: Four times a day (QID) | INTRAMUSCULAR | Status: AC | PRN
  Administered 2019-07-08: 15 mg via INTRAVENOUS
  Filled 2019-07-07: qty 1

## 2019-07-07 MED ORDER — PROMETHAZINE HCL 25 MG/ML IJ SOLN
12.5000 mg | Freq: Four times a day (QID) | INTRAMUSCULAR | Status: DC | PRN
  Administered 2019-07-07 – 2019-07-08 (×2): 12.5 mg via INTRAVENOUS
  Filled 2019-07-07 (×2): qty 1

## 2019-07-07 NOTE — Progress Notes (Addendum)
    CC: abdominal pain  Subjective: Still having fevers, also having nausea, which is pretty common.  She is followed by Dr. Arty Baumgartner for this and was going to get a EGD, but he got COVID and has not had a chance to get it done since then.  Better on Dexilant at home.  Objective: Vital signs in last 24 hours: Temp:  [98 F (36.7 C)-100.9 F (38.3 C)] 98.2 F (36.8 C) (04/22 0700) Pulse Rate:  [100-115] 100 (04/22 0700) Resp:  [16-18] 17 (04/22 0700) BP: (130-149)/(78-88) 145/78 (04/22 0700) SpO2:  [96 %-98 %] 97 % (04/22 0700) Last BM Date: 07/06/19 240 PO recorded 1835 IV recorded NO output recorded, emesis x 1 this AM TM 100.9 x 2 last evening; HR up with fever and still in 110 range BMP OK WBC 17.9(4/19)>>15.0(4/21)>>12.7(4/22)  Intake/Output from previous day: 04/21 0701 - 04/22 0700 In: 2075.6 [P.O.:240; I.V.:1000; IV Piggyback:835.6] Out: -  Intake/Output this shift: Total I/O In: -  Out: 300 [Emesis/NG output:300]  General appearance: alert, cooperative and no distress Resp: clear to auscultation bilaterally and moving 2000 on IS GI: sore, complaining of pain RLQ and some going to his shoulder, no peritionitis.  BS hypoactive.  Lab Results:  Recent Labs    07/06/19 0545 07/07/19 0449  WBC 15.0* 12.7*  HGB 13.4 12.7*  HCT 38.7* 36.6*  PLT 183 195    BMET Recent Labs    07/06/19 0545 07/07/19 0449  NA 138 138  K 3.6 3.7  CL 104 106  CO2 22 22  GLUCOSE 107* 101*  BUN 18 19  CREATININE 1.12 1.03  CALCIUM 8.5* 8.5*   PT/INR Recent Labs    07/04/19 2229  LABPROT 17.1*  INR 1.4*    Recent Labs  Lab 07/03/19 1126 07/04/19 2229  AST 22 24  ALT 18 16  ALKPHOS 79 66  BILITOT 1.3* 1.4*  PROT 7.7 7.4  ALBUMIN 4.4 3.4*     Lipase  No results found for: LIPASE   Medications: . acetaminophen  1,000 mg Oral Q6H  . docusate sodium  100 mg Oral BID  . enoxaparin (LOVENOX) injection  40 mg Subcutaneous Daily  . pantoprazole (PROTONIX) IV   40 mg Intravenous Q24H    Assessment/Plan Hx GERD/nausea/vomiting at home   s/p Laparoscopic Appendectomy for perforated appendicitis (contained) - Dr. Maisie Fus 4/18 - 07/04/19 Readmit 07/05/19 Post-Op Intra-abdominal fluid collection Reactive ileus - Fluid collection does not appear amenable to IR drainage - Continue IV abx. Vanc added overnight  - WBC 17.9(4/19)>>15.0(4/21)>>12.7(4/22) - Ileus resolving, Allow clears - Cont IVF, bolus for tachycardia - AM labs - Mobilize, IS  FEN -Clears, IVF, bolus VTE -SCDs, Lovenox ID -Zosyn/Vanc 4/20 >> Tmax 103.2, >>100.9   Plan:  Back him down to full liquids, increase PPI, continue ABX/bowel rest, recheck labs again in AM.         LOS: 2 days    Matthew Pace 07/07/2019 Please see Amion

## 2019-07-07 NOTE — Progress Notes (Signed)
PA Marlyne Beards notified of patient status. Patient unable to keep liquids down and experiencing bouts of nausea and diarrhea. No complaints of pain. PA Marlyne Beards then in to see and assess patient.

## 2019-07-08 ENCOUNTER — Inpatient Hospital Stay (HOSPITAL_COMMUNITY): Payer: BC Managed Care – PPO

## 2019-07-08 ENCOUNTER — Inpatient Hospital Stay: Payer: Self-pay

## 2019-07-08 LAB — GLUCOSE, CAPILLARY: Glucose-Capillary: 117 mg/dL — ABNORMAL HIGH (ref 70–99)

## 2019-07-08 LAB — CBC
HCT: 38.4 % — ABNORMAL LOW (ref 39.0–52.0)
Hemoglobin: 13.2 g/dL (ref 13.0–17.0)
MCH: 30.8 pg (ref 26.0–34.0)
MCHC: 34.4 g/dL (ref 30.0–36.0)
MCV: 89.5 fL (ref 80.0–100.0)
Platelets: 239 10*3/uL (ref 150–400)
RBC: 4.29 MIL/uL (ref 4.22–5.81)
RDW: 12.3 % (ref 11.5–15.5)
WBC: 14.6 10*3/uL — ABNORMAL HIGH (ref 4.0–10.5)
nRBC: 0 % (ref 0.0–0.2)

## 2019-07-08 LAB — CULTURE, BLOOD (ROUTINE X 2)
Culture: NO GROWTH
Culture: NO GROWTH
Special Requests: ADEQUATE
Special Requests: ADEQUATE

## 2019-07-08 LAB — BASIC METABOLIC PANEL
Anion gap: 12 (ref 5–15)
BUN: 18 mg/dL (ref 6–20)
CO2: 22 mmol/L (ref 22–32)
Calcium: 8.6 mg/dL — ABNORMAL LOW (ref 8.9–10.3)
Chloride: 106 mmol/L (ref 98–111)
Creatinine, Ser: 0.98 mg/dL (ref 0.61–1.24)
GFR calc Af Amer: >60 mL/min (ref >60)
GFR calc non Af Amer: >60 mL/min (ref >60)
Glucose, Bld: 103 mg/dL — ABNORMAL HIGH (ref 70–99)
Potassium: 3.6 mmol/L (ref 3.5–5.1)
Sodium: 140 mmol/L (ref 135–145)

## 2019-07-08 MED ORDER — MIDAZOLAM HCL 2 MG/2ML IJ SOLN
INTRAMUSCULAR | Status: AC
  Filled 2019-07-08: qty 4

## 2019-07-08 MED ORDER — KCL IN DEXTROSE-NACL 40-5-0.9 MEQ/L-%-% IV SOLN
INTRAVENOUS | Status: AC
  Filled 2019-07-08 (×4): qty 1000

## 2019-07-08 MED ORDER — FENTANYL CITRATE (PF) 100 MCG/2ML IJ SOLN
INTRAMUSCULAR | Status: AC
  Filled 2019-07-08: qty 4

## 2019-07-08 MED ORDER — FENTANYL CITRATE (PF) 100 MCG/2ML IJ SOLN
INTRAMUSCULAR | Status: AC | PRN
  Administered 2019-07-08 (×3): 50 ug via INTRAVENOUS
  Administered 2019-07-08: 25 ug via INTRAVENOUS

## 2019-07-08 MED ORDER — INSULIN ASPART 100 UNIT/ML ~~LOC~~ SOLN
0.0000 [IU] | SUBCUTANEOUS | Status: DC
  Administered 2019-07-09 – 2019-07-10 (×3): 1 [IU] via SUBCUTANEOUS

## 2019-07-08 MED ORDER — IOHEXOL 300 MG/ML  SOLN
80.0000 mL | Freq: Once | INTRAMUSCULAR | Status: AC | PRN
  Administered 2019-07-08: 80 mL via INTRAVENOUS

## 2019-07-08 MED ORDER — MIDAZOLAM HCL 2 MG/2ML IJ SOLN
INTRAMUSCULAR | Status: AC | PRN
  Administered 2019-07-08: 0.5 mg via INTRAVENOUS
  Administered 2019-07-08: 2 mg via INTRAVENOUS
  Administered 2019-07-08: 1 mg via INTRAVENOUS

## 2019-07-08 MED ORDER — PROMETHAZINE HCL 25 MG/ML IJ SOLN
6.2500 mg | Freq: Four times a day (QID) | INTRAMUSCULAR | Status: DC | PRN
  Administered 2019-07-09: 6.25 mg via INTRAVENOUS
  Filled 2019-07-08: qty 1

## 2019-07-08 NOTE — Sedation Documentation (Signed)
Vital signs stable. 

## 2019-07-08 NOTE — Progress Notes (Signed)
PHARMACY - TOTAL PARENTERAL NUTRITION CONSULT NOTE   Indication: Prolonged ileus  Patient Measurements: Height: 5\' 10"  (177.8 cm) Weight: 99.8 kg (220 lb) IBW/kg (Calculated) : 73 TPN AdjBW (KG): 79.7 Body mass index is 31.57 kg/m.  Assessment:  51 yom presented to the hospital after an appendectomy on 4/18 with fever and vomiting. Now with possible SBO vs paralytic ileus to start TPN for nutrition support.   Glucose / Insulin: AM gluc WNL Electrolytes: K 3.6 (goal >/= 4 with ileus) - potassium added to IV fluids Renal: WNL LFTs / TGs: LFTs WNL Prealbumin / albumin: Alb 3.4 Intake / Output; MIVF: D5NS with 5/18 K/L at 127ml/hr GI Imaging: 4/18 CT abd - acute appendicitis 4/20 CT abd - gas and fluid collection at the operative site compatible with a developing abscess; Wall thickening is present in the terminal ileum with developing ileus 4/23 CT abd - multiple possible abscesses; partial small bowel obstruction versus paralytic ileus secondary to inflammation Surgeries / Procedures:  4/18 Appendectomy  Central access: PICC ordered 4/23 TPN start date: 4/24  Nutritional Goals (per RD recommendation on *pending*): kCal: 2100-2500, Protein: 100-120, Fluid: >2L  Current Nutrition:  Clear liquids  Plan:  TPN ordered after cutoff time of noon. Will start tomorrow night.  Initiate Sensitive q4h SSI and adjust as needed  TPN labs ordered Dietician consulted - f/u recommendations  Tamkia Temples, 5/24 07/08/2019,12:36 PM

## 2019-07-08 NOTE — Procedures (Signed)
Interventional Radiology Procedure Note  Procedure: Placement of 60F drain into intra-abdominal abscess yielding 45 mL foul-smelling purulent fluid.   Complications: None  Estimated Blood Loss: None  Recommendations: - Drain to JP - Flush Q shift - Cultures sent   Signed,  Sterling Big, MD

## 2019-07-08 NOTE — Sedation Documentation (Signed)
Patient is resting comfortably. 

## 2019-07-08 NOTE — Progress Notes (Signed)
Initial Nutrition Assessment  DOCUMENTATION CODES:   Obesity unspecified  INTERVENTION:   - TPN per pharmacy  NUTRITION DIAGNOSIS:   Inadequate oral intake related to altered GI function as evidenced by per patient/family report, other (clear liquid diet order).  GOAL:   Patient will meet greater than or equal to 90% of their needs  MONITOR:   PO intake, Diet advancement, Labs, Weight trends  REASON FOR ASSESSMENT:   Consult New TPN/TNA  ASSESSMENT:   29 year old male who presented on 4/19 with vomiting and fever. PMH of GERD and perforated appendicitis s/p laparoscopic appendectomy on 07/03/19. Pt found to have post-op intra-abdominal fluid collection, reactive ileus.   4/21 - advanced to regular diet 4/22 - downgraded to full liquids, downgraded to clears  Pt with increased N/V and diarrhea. CT with additional collection. Noted plan for image guided drain placement.  Plan for PICC placement today. TPN will start 4/24.  No meal completion documented at this time.  Spoke with pt, pt's mother, and pt's girlfriend at bedside. Pt reports he has not had any N/V today because he has not taken much PO. Pt reports that the last time he had a full meal was last Saturday, almost one week ago.  Pt reports that he has always struggled with "reflux issues" but that they started worsening in January of this year. Pt reports that he experiences N/V with certain foods (citrus, pork) and if he eats too soon before going to bed. Pt is on medication for reflux.  Pt reports that overall he eats well and has 3 meals daily and snacks between meals. Pt denies any recent changes in weight and reports his UBW as 220 lbs. Weight obtained at time of RD visit was 220.8 lbs. Reviewed weight history in chart. Weight stable since 2018.  RD offered to order clear liquid oral nutrition supplements but pt declined stating "it will come right back up."  Medications reviewed and include: colace, SSI q 4  hours, protonix, IV abx, D5 and NS with KCl @ 125 ml/hr  Labs reviewed.  NUTRITION - FOCUSED PHYSICAL EXAM:    Most Recent Value  Orbital Region  No depletion  Upper Arm Region  No depletion  Thoracic and Lumbar Region  No depletion  Buccal Region  No depletion  Temple Region  No depletion  Clavicle Bone Region  No depletion  Clavicle and Acromion Bone Region  No depletion  Scapular Bone Region  No depletion  Dorsal Hand  No depletion  Patellar Region  No depletion  Anterior Thigh Region  No depletion  Edema (RD Assessment)  None  Hair  Reviewed  Eyes  Reviewed  Mouth  Reviewed  Skin  Reviewed  Nails  Reviewed       Diet Order:   Diet Order            Diet clear liquid Room service appropriate? Yes; Fluid consistency: Thin  Diet effective now              EDUCATION NEEDS:   Education needs have been addressed  Skin:  Skin Assessment: Reviewed RN Assessment  Last BM:  07/07/19 type 7  Height:   Ht Readings from Last 1 Encounters:  07/05/19 5\' 10"  (1.778 m)    Weight:   Wt Readings from Last 1 Encounters:  07/05/19 99.8 kg    Ideal Body Weight:  75.5 kg  BMI:  Body mass index is 31.57 kg/m.  Estimated Nutritional Needs:   Kcal:  5809-9833  Protein:  115-135 grams  Fluid:  >/= 2.0 L    Earma Reading, MS, RD, LDN Inpatient Clinical Dietitian Pager: [phone] Weekend/After Hours: [phone]

## 2019-07-08 NOTE — Progress Notes (Signed)
Pt girlfriend came to the nursing station and stated that she had spoke to pts gastroenterology office [facility]) and she wants Dr. Elnoria Howard to come by and see the patient. His normal doctor is Dr. Loreta Ave but Elnoria Howard is on call for the office. RN called Dr. Janee Morn and explained to him and he stated he would reach out to them. Will follow up

## 2019-07-08 NOTE — Progress Notes (Signed)
    CC: abdominal pain  Subjective: Called yesterday PM with increased nausea and vomiting, could not keep anything down.  Switched from reg diet to full liquids in AM yesterday, and then we just put him on clears last PM. Still having nausea/vomiting and diarrhea.  Abdominal exam is still the same.  Objective: Vital signs in last 24 hours: Temp:  [98.1 F (36.7 C)-100.1 F (37.8 C)] 98.1 F (36.7 C) (04/23 0315) Pulse Rate:  [75-113] 75 (04/23 0315) Resp:  [16-24] 16 (04/23 0315) BP: (125-148)/(75-96) 148/81 (04/23 0315) SpO2:  [96 %-98 %] 98 % (04/23 0315) Last BM Date: 07/07/19 240 Po 1800 IV 400 emesis BM x 3  Urine not recorded TM 100.1 WBC up to 14.6 K+ 3.6 4/20 admit CT   Intake/Output from previous day: 04/22 0701 - 04/23 0700 In: 2127.3 [P.O.:240; I.V.:1100; IV Piggyback:787.3] Out: 400 [Emesis/NG output:400] Intake/Output this shift: No intake/output data recorded.  General appearance: alert, cooperative and no distress Resp: clear to auscultation bilaterally GI: soft tender, still has pain over RLQ, no peritonitis   Lab Results:  Recent Labs    07/07/19 0449 07/08/19 0519  WBC 12.7* 14.6*  HGB 12.7* 13.2  HCT 36.6* 38.4*  PLT 195 239    BMET Recent Labs    07/07/19 0449 07/08/19 0519  NA 138 140  K 3.7 3.6  CL 106 106  CO2 22 22  GLUCOSE 101* 103*  BUN 19 18  CREATININE 1.03 0.98  CALCIUM 8.5* 8.6*   PT/INR No results for input(s): LABPROT, INR in the last 72 hours.  Recent Labs  Lab 07/03/19 1126 07/04/19 2229  AST 22 24  ALT 18 16  ALKPHOS 79 66  BILITOT 1.3* 1.4*  PROT 7.7 7.4  ALBUMIN 4.4 3.4*     Lipase  No results found for: LIPASE   Medications: . docusate sodium  100 mg Oral BID  . enoxaparin (LOVENOX) injection  40 mg Subcutaneous Daily  . pantoprazole (PROTONIX) IV  40 mg Intravenous Q12H   . sodium chloride 125 mL/hr at 07/07/19 2354  . piperacillin-tazobactam (ZOSYN)  IV 3.375 g (07/08/19 0002)  .  vancomycin 1,750 mg (07/07/19 2156)    Assessment/Plan Hx GERD/nausea/vomiting at home   s/p Laparoscopic Appendectomy for perforated appendicitis (contained) - Dr. Maisie Fus 4/18 - 07/04/19 Readmit 07/05/19 Post-Op Intra-abdominal fluid collection Reactive ileus - Fluid collection does not appear amenable to IR drainage - Continue IV abx. Vanc added overnight  - WBC 17.9(4/19)>>15.0(4/21)>>12.7(4/22)>>14.6(4/23) - Ileus resolving,Allow clears -Cont IVF - AM labs - Mobilize, IS  FEN -Clears, VTE -SCDs, Lovenox ID -Zosyn/Vanc4/20 >>Tmax 103.2, >>100.9>>100.1   Plan:  I am going to repeat the CT this AM.  I told him to take as much of the oral contrast as he could.      LOS: 3 days    Antwann Preziosi 07/08/2019 Please see Amion

## 2019-07-08 NOTE — Progress Notes (Signed)
Pharmacy Antibiotic Note  Matthew Pace is a 29 y.o. male admitted on 07/04/2019 with intra-abdominal infection. Recent hospitalization s/p  laparascopic appendectomy for  perforated appendicitis (contained).  Pharmacy has been consulted for Vancomycin and Zosyn dosing.   Day #4 antibiotics. IR consulted for drainage of absecess.  Cultures negative to date. Renal function stable.   Plan: Continue Zosyn 3.375 gm IV q8hrs (each over 4 hours) Continue Vancomycin 1750 mg IV Q 12 hrs.  Goal AUC 400-550. Expected AUC: 480 SCr used: 0.98 Follow renal function, culture data, clinical progress. Will plan to check Vanc trough level by Monday if Vanc to continue.   Height: 5\' 10"  (177.8 cm) Weight: 99.8 kg (220 lb) IBW/kg (Calculated) : 73  Temp (24hrs), Avg:99 F (37.2 C), Min:98.1 F (36.7 C), Max:99.4 F (37.4 C)  Recent Labs  Lab 07/03/19 1126 07/03/19 1343 07/04/19 2229 07/06/19 0545 07/07/19 0449 07/08/19 0519  WBC 15.5*  --  17.9* 15.0* 12.7* 14.6*  CREATININE 0.99  --  0.99 1.12 1.03 0.98  LATICACIDVEN 1.1 0.9 1.1  --   --   --     Estimated Creatinine Clearance: 134 mL/min (by C-G formula based on SCr of 0.98 mg/dL).    No Known Allergies  Antimicrobials this admission:  Zosyn 4/20 >>  Vancomycin 4/20 >>  Dose adjustments this admission:  n/a  Microbiology results:  4/20 HIV: non-reactive  4/20 COVID: negative  4/19 blood x 2: ng x 4 days to date - last admit:  4/18 COVID and flu: negative  4/18 Blood x 2: negative  4/18 urine: negative  Thank you for allowing pharmacy to be a part of this patient's care.  5/18, Dennie Fetters Phone: [phone] 07/08/2019 5:49 PM

## 2019-07-08 NOTE — Sedation Documentation (Signed)
Pt grimacing 

## 2019-07-08 NOTE — Sedation Documentation (Signed)
Pt is awake and alert

## 2019-07-08 NOTE — Progress Notes (Signed)
Chief Complaint: Patient was seen in consultation today for post op abdominal abscesses  Referring Physician(s): Will Marlyne Beards PA-C  Supervising Physician: Malachy Moan  Patient Status: [name] - In-pt  History of Present Illness: Matthew Pace is a 29 y.o. male who underwent lap appy for perf appendicitis on 4/18. Was discharged on 4/19 but had to return same day for ongoing fever, pain, and N/V Has been readmitted with evidence of abscess. Been on IV abx foe a few days without much clinical improvement. Repeat CT now shows enlarging abscess and second fluid collection in the pelvis and associated ileus v PSBO. Pt states has been having persistent N/V IR is asked to eval for perc drainage of abscess collections. PMHx, meds, labs, imaging, allergies reviewed. Has been NPO Family at bedside.   Past Medical History:  Diagnosis Date  . GERD (gastroesophageal reflux disease)     Past Surgical History:  Procedure Laterality Date  . LAPAROSCOPIC APPENDECTOMY N/A 07/03/2019   Procedure: APPENDECTOMY LAPAROSCOPIC;  Surgeon: Romie Levee, MD;  Location: [facility];  Service: General;  Laterality: N/A;    Allergies: Patient has no known allergies.  Medications:  Current Facility-Administered Medications:  .  dextrose 5 % and 0.9 % NaCl with KCl 40 mEq/L infusion, , Intravenous, Continuous, Sherrie George, PA-C, Last Rate: 125 mL/hr at 07/08/19 0832, New Bag at 07/08/19 0832 .  diphenhydrAMINE (BENADRYL) capsule 25 mg, 25 mg, Oral, Q6H PRN **OR** diphenhydrAMINE (BENADRYL) injection 25 mg, 25 mg, Intravenous, Q6H PRN, Maczis, Elmer Sow, PA-C .  docusate sodium (COLACE) capsule 100 mg, 100 mg, Oral, BID, Jacinto Halim, PA-C, 100 mg at 07/05/19 0943 .  enoxaparin (LOVENOX) injection 40 mg, 40 mg, Subcutaneous, Daily, Jacinto Halim, PA-C, 40 mg at 07/08/19 0835 .  insulin aspart (novoLOG) injection 0-9 Units, 0-9 Units, Subcutaneous, Q4H, Rumbarger, Rachel L, RPH .   ketorolac (TORADOL) 30 MG/ML injection 15 mg, 15 mg, Intravenous, Q6H PRN, Sherrie George, PA-C .  metoprolol tartrate (LOPRESSOR) injection 5 mg, 5 mg, Intravenous, Q6H PRN, Jacinto Halim, PA-C, 5 mg at 07/05/19 1323 .  metoprolol tartrate (LOPRESSOR) injection 5 mg, 5 mg, Intravenous, Q6H PRN, Emelia Loron, MD, 5 mg at 07/06/19 0936 .  morphine 2 MG/ML injection 2-3 mg, 2-3 mg, Intravenous, Q2H PRN, Sherrie George, PA-C .  ondansetron (ZOFRAN-ODT) disintegrating tablet 4 mg, 4 mg, Oral, Q6H PRN **OR** ondansetron (ZOFRAN) injection 4 mg, 4 mg, Intravenous, Q6H PRN, Jacinto Halim, PA-C, 4 mg at 07/07/19 0819 .  oxyCODONE (Oxy IR/ROXICODONE) immediate release tablet 5-10 mg, 5-10 mg, Oral, Q4H PRN, Maczis, Elmer Sow, PA-C .  pantoprazole (PROTONIX) injection 40 mg, 40 mg, Intravenous, Q12H, Sherrie George, PA-C, 40 mg at 07/08/19 5809 .  piperacillin-tazobactam (ZOSYN) IVPB 3.375 g, 3.375 g, Intravenous, Q8H, Maczis, Elmer Sow, PA-C, Last Rate: 12.5 mL/hr at 07/08/19 0750, 3.375 g at 07/08/19 0750 .  polyethylene glycol (MIRALAX / GLYCOLAX) packet 17 g, 17 g, Oral, Daily PRN, Maczis, Elmer Sow, PA-C .  promethazine (PHENERGAN) injection 6.25 mg, 6.25 mg, Intravenous, Q6H PRN, Sherrie George, PA-C .  simethicone (MYLICON) chewable tablet 40 mg, 40 mg, Oral, Q6H PRN, Maczis, Elmer Sow, PA-C .  vancomycin (VANCOREADY) IVPB 1750 mg/350 mL, 1,750 mg, Intravenous, Q12H, Emelia Loron, MD, Last Rate: 175 mL/hr at 07/08/19 0835, 1,750 mg at 07/08/19 0835    Family History  Problem Relation Age of Onset  . Diabetes Mother   . Hypertension Mother   . Diabetes Maternal Grandmother   .  Stroke Maternal Grandmother   . Cancer Maternal Grandfather   . Heart disease Paternal Grandfather   . Stroke Paternal Grandfather     Social History   Socioeconomic History  . Marital status: Single    Spouse name: Not on file  . Number of children: Not on file  . Years of education:  Not on file  . Highest education level: Not on file  Occupational History  . Not on file  Tobacco Use  . Smoking status: Former Smoker    Packs/day: 1.00    Types: Cigarettes    Quit date: 12/31/2015    Years since quitting: 3.5  . Smokeless tobacco: Never Used  Substance and Sexual Activity  . Alcohol use: Yes    Alcohol/week: 7.0 standard drinks    Types: 7 Standard drinks or equivalent per week  . Drug use: No  . Sexual activity: Not on file  Other Topics Concern  . Not on file  Social History Narrative  . Not on file   Social Determinants of Health   Financial Resource Strain:   . Difficulty of Paying Living Expenses:   Food Insecurity:   . Worried About Programme researcher, broadcasting/film/video in the Last Year:   . Barista in the Last Year:   Transportation Needs:   . Freight forwarder (Medical):   Marland Kitchen Lack of Transportation (Non-Medical):   Physical Activity:   . Days of Exercise per Week:   . Minutes of Exercise per Session:   Stress:   . Feeling of Stress :   Social Connections:   . Frequency of Communication with Friends and Family:   . Frequency of Social Gatherings with Friends and Family:   . Attends Religious Services:   . Active Member of Clubs or Organizations:   . Attends Banker Meetings:   Marland Kitchen Marital Status:      Review of Systems: A 12 point ROS discussed and pertinent positives are indicated in the HPI above.  All other systems are negative.  Review of Systems  Vital Signs: BP 137/81 (BP Location: Right Arm)   Pulse 88   Temp 99.4 F (37.4 C) (Oral)   Resp 18   Ht 5\' 10"  (1.778 m)   Wt 99.8 kg   SpO2 97%   BMI 31.57 kg/m   Physical Exam Constitutional:      Appearance: He is ill-appearing.  Cardiovascular:     Rate and Rhythm: Normal rate and regular rhythm.     Heart sounds: Normal heart sounds.  Pulmonary:     Effort: Pulmonary effort is normal. No respiratory distress.     Breath sounds: Normal breath sounds.  Abdominal:      General: Abdomen is flat. There is distension.     Palpations: Abdomen is soft.     Tenderness: There is abdominal tenderness.  Skin:    General: Skin is warm and dry.  Neurological:     General: No focal deficit present.     Mental Status: He is alert and oriented to person, place, and time.  Psychiatric:        Mood and Affect: Mood normal.        Thought Content: Thought content normal.        Judgment: Judgment normal.       Imaging: DG Chest 2 View  Result Date: 07/04/2019 CLINICAL DATA:  29 year old male with sepsis. EXAM: CHEST - 2 VIEW COMPARISON:  None. FINDINGS: Right mid lung field  and left lung base linear atelectasis/scarring. No consolidative changes. There is no pleural effusion pneumothorax. The cardiac silhouette is within normal limits. No acute osseous pathology. IMPRESSION: No active cardiopulmonary disease. Electronically Signed   By: Elgie CollardArash  Radparvar M.D.   On: 07/04/2019 22:50   CT ABDOMEN PELVIS W CONTRAST  Result Date: 07/08/2019 CLINICAL DATA:  Abdominal abscess or infection suspected. Status post perforated appendix. EXAM: CT ABDOMEN AND PELVIS WITH CONTRAST TECHNIQUE: Multidetector CT imaging of the abdomen and pelvis was performed using the standard protocol following bolus administration of intravenous contrast. CONTRAST:  80mL OMNIPAQUE IOHEXOL 300 MG/ML  SOLN COMPARISON:  07/05/2019 and 07/03/2019 FINDINGS: Lower chest: Dependent changes are identified within the lung bases posteriorly including increased ground-glass attenuation, pleural effusion and subsegmental atelectasis. Hepatobiliary: No focal liver abnormality is seen. No gallstones, gallbladder wall thickening, or biliary dilatation. Pancreas: Unremarkable. No pancreatic ductal dilatation or surrounding inflammatory changes. Spleen: Normal in size without focal abnormality. Adrenals/Urinary Tract: Normal appearance of the adrenal glands. 2 mm stone within inferior pole of left kidney noted. No  hydronephrosis identified bilaterally. Urinary bladder is unremarkable. Stomach/Bowel: The stomach is nondistended. Increase caliber of the small bowel loops are identified which measure up to 3.5 cm. There is a transition to decreased caliber distal small bowel loops within the left lower quadrant. Postoperative changes are identified within the right lower quadrant of the abdomen. There is associated inflammation, free fluid and fat stranding. Vascular/Lymphatic: Normal appearance of the abdominal aorta. Prominent ileocolic lymph nodes are identified in the right lower quadrant of the abdomen, likely reactive, image 63/3. Reproductive: Prostate is unremarkable. Other: Early, immature fluid collection, in the right lower quadrant of the abdomen measures 5.9 by 3.4 by 5.3 cm. A separate fluid collection is identified between the anterior wall of rectum and bladder measuring 4.5 x 2.9 by 2.9 cm, image 82/3. Lastly, inflammation, phlegmon and possible small linear fluid collection is identified tracking along the right pericolic gutter, image 56/6. Musculoskeletal: No acute or significant osseous findings. IMPRESSION: 1. Early, immature fluid collection, in the right lower quadrant of the abdomen is identified, which may reflect early abscess. A separate fluid collection is identified between the anterior wall of rectum and bladder and may represent an additional abscess. Inflammation, phlegmon and possible early fluid collection is also noted tracking along the right pericolic gutter towards the inferior margin of the liver. 2. Increase caliber of the small bowel loops with transition to decreased caliber distal small bowel loops within the left lower quadrant. Findings concerning for partial small bowel obstruction versus paralytic ileus secondary to inflammation. 3. Nonobstructing left renal calculus. 4. Dependent changes are identified within the lung bases posteriorly including increased ground-glass attenuation,  ground-glass attenuation, pleural effusion and subsegmental atelectasis. Electronically Signed   By: Signa Kellaylor  Stroud M.D.   On: 07/08/2019 11:26   CT ABDOMEN PELVIS W CONTRAST  Result Date: 07/05/2019 CLINICAL DATA:  Pain, unspecified. Emergency appendectomy 2 days ago with persistent fever and pain. EXAM: CT ABDOMEN AND PELVIS WITH CONTRAST TECHNIQUE: Multidetector CT imaging of the abdomen and pelvis was performed using the standard protocol following bolus administration of intravenous contrast. CONTRAST:  100mL OMNIPAQUE IOHEXOL 300 MG/ML  SOLN COMPARISON:  CT of the abdomen and pelvis 07/03/2018 FINDINGS: Lower chest: Bibasilar airspace opacities likely reflects atelectasis. Heart size is normal. No significant pleural or pericardial effusion is present. Hepatobiliary: No focal liver abnormality is seen. No gallstones, gallbladder wall thickening, or biliary dilatation. Pancreas: Unremarkable. No pancreatic ductal dilatation or  surrounding inflammatory changes. Spleen: Normal in size without focal abnormality. Adrenals/Urinary Tract: The adrenal glands are normal. Kidneys and ureters are within normal limits. 3 mm nonobstructing left lower pole stone is again seen. No obstruction is present. The urinary bladder is within normal limits. Stomach/Bowel: Stomach mildly distended. Contrast is present within a distended distal esophagus. Duodenal ulcer again noted. Diffuse mesenteric stranding is present. Is wall thickening in the terminal ileum. Appendectomy is noted. Fluid collection at the operative site demonstrates gas fluid level. The collection measures 3.1 x 3.0 x 2.2 cm. Inflammatory changes are present about the cecum and proximal ascending colon. More distal ascending colon and transverse colon are within limits. The descending and sigmoid colon are normal. Vascular/Lymphatic: Reactive lymph nodes are present within the mesentery. No significant retroperitoneal adenopathy is present. Reproductive:  Prostate is unremarkable. Musculoskeletal: Vertebral body heights and alignment are maintained. Pelvis is normal. Hips are located and within normal limits. IMPRESSION: 1. 3.1 x 3.0 x 2.2 cm gas and fluid collection at the operative site compatible with a developing abscess. 2. Inflammatory changes about the cecum and proximal ascending colon may be postoperative. Wall thickening is present in the terminal ileum with developing ileus. 3. Reactive lymph nodes within the mesentery. 4. Stable chronic duodenal ulcer. 5. Bibasilar airspace disease likely reflects atelectasis. 6. Stable 3 mm nonobstructing stone at the lower pole of the left kidney. Electronically Signed   By: Marin Roberts M.D.   On: 07/05/2019 05:58   CT ABDOMEN PELVIS W CONTRAST  Result Date: 07/03/2019 CLINICAL DATA:  Abdominal distension RIGHT lower quadrant pain. Vomiting. EXAM: CT ABDOMEN AND PELVIS WITH CONTRAST TECHNIQUE: Multidetector CT imaging of the abdomen and pelvis was performed using the standard protocol following bolus administration of intravenous contrast. CONTRAST:  OMNIPAQUE IOHEXOL 300 MG/ML  SOLN COMPARISON:  None. FINDINGS: Lower chest: Patchy ground-glass opacities within the lingula. Hepatobiliary: No focal liver abnormality is seen. No gallstones, gallbladder wall thickening, or biliary dilatation. Pancreas: Unremarkable. No pancreatic ductal dilatation or surrounding inflammatory changes. Spleen: Normal in size without focal abnormality. Adrenals/Urinary Tract: Adrenal glands appear normal. 3 mm nonobstructing LEFT renal stone. Kidneys are otherwise unremarkable without mass, stone or hydronephrosis. No ureteral or bladder calculi identified. Bladder is unremarkable, partially decompressed. Stomach/Bowel: Appendix: Location: RIGHT lower quadrant Diameter: 1.6 cm Appendicolith: 1 appendicolith near the base of the appendix measures 7 mm. A second smaller appendicolith near the distal tip. Mucosal  hyper-enhancement: Questionable Extraluminal gas: None seen Periappendiceal collection: Fluid stranding without discrete collection/abscess. Reactive thickening of the walls of the cecum. No dilated large or small bowel loops. Stomach is unremarkable, partially decompressed. Vascular/Lymphatic: Reactive lymphadenopathy in the RIGHT lower quadrant. No vascular abnormality. Reproductive: Prostate is unremarkable. Other: No abscess collection or free intraperitoneal air. Musculoskeletal: No acute or suspicious osseous finding. Superficial soft tissues are unremarkable. IMPRESSION: 1. Acute appendicitis. Appendix is distended to 1.6 cm. There are at least 2 appendicoliths, the largest stone measuring 7 mm near the base of the appendix. Significant amount of periappendiceal inflammation/fluid stranding. No abscess collection seen. No free intraperitoneal air. 2. Patchy ground-glass opacities within the lingula. Differential includes atypical pneumonias such as viral or fungal, aspiration pneumonitis and respiratory bronchiolitis. COVID-19 pneumonia can have this appearance. 3. 3 mm nonobstructing LEFT renal stone. Electronically Signed   By: Bary Richard M.D.   On: 07/03/2019 13:02   [place] EKG SITE RITE  Result Date: 07/08/2019 If Site Rite image not attached, placement could not be confirmed due to current  cardiac rhythm.   Labs:  CBC: Recent Labs    07/04/19 2229 07/06/19 0545 07/07/19 0449 07/08/19 0519  WBC 17.9* 15.0* 12.7* 14.6*  HGB 15.6 13.4 12.7* 13.2  HCT 44.7 38.7* 36.6* 38.4*  PLT 195 183 195 239    COAGS: Recent Labs    07/03/19 1126 07/04/19 2229  INR 1.1 1.4*  APTT 28  --     BMP: Recent Labs    07/04/19 2229 07/06/19 0545 07/07/19 0449 07/08/19 0519  NA 135 138 138 140  K 3.9 3.6 3.7 3.6  CL 102 104 106 106  CO2 23 22 22 22   GLUCOSE 124* 107* 101* 103*  BUN 15 18 19 18   CALCIUM 9.3 8.5* 8.5* 8.6*  CREATININE 0.99 1.12 1.03 0.98  GFRNONAA >60 >60 >60 >60  GFRAA  >60 >60 >60 >60    LIVER FUNCTION TESTS: Recent Labs    07/03/19 1126 07/04/19 2229  BILITOT 1.3* 1.4*  AST 22 24  ALT 18 16  ALKPHOS 79 66  PROT 7.7 7.4  ALBUMIN 4.4 3.4*    TUMOR MARKERS: No results for input(s): AFPTM, CEA, CA199, CHROMGRNA in the last 8760 hours.  Assessment and Plan: Hx recent lap appy for perf appendicitis Post op abscess. Imaging reviewed. Plan for image guided drain placement, possibly two drains if possible/safe window. Risks and benefits discussed with the patient including bleeding, infection, damage to adjacent structures, bowel perforation/fistula connection, and sepsis.  All of the patient's questions were answered, patient is agreeable to proceed. Consent signed and in chart.    Thank you for this interesting consult.  I greatly enjoyed meeting Lillard Bailon and look forward to participating in their care.  A copy of this report was sent to the requesting provider on this date.  Electronically Signed: Ascencion Dike, PA-C 07/08/2019, 3:19 PM   I spent a total of 20 minutes in face to face in clinical consultation, greater than 50% of which was counseling/coordinating care for perc abscess drains

## 2019-07-08 NOTE — Consult Note (Signed)
Reason for Consult: Nausea/vomiting, GI issues Referring Physician: CCS  Matthew Pace HPI: This is a 29 year old male with a PMH of a recent contained perforated appendicitis 4/18, who is readmitted with worsening RLQ pain.  The patient suffered with nausea, vomiting, and a fever of 102.  A repeat CT scan showed a 3 cm developing abscess as well as an ileus.  His WBC was increased to 17.9.  At the request of his girlfriend a GI consult was ordered.  The patient was evaluated by Dr. Collene Mares in the office in January for GI symptoms.  These symptoms included nausea and vomiting.  The plan was to perform an EGD, but the patient cancelled as a result of COVID-19 and then cancelled a second time as a result of work issues.  The patient vaguely reports having a "sensitive stomach", but he is unable to quantify this issue further.  Past Medical History:  Diagnosis Date  . GERD (gastroesophageal reflux disease)     Past Surgical History:  Procedure Laterality Date  . LAPAROSCOPIC APPENDECTOMY N/A 07/03/2019   Procedure: APPENDECTOMY LAPAROSCOPIC;  Surgeon: Leighton Ruff, MD;  Location: [facility];  Service: General;  Laterality: N/A;    Family History  Problem Relation Age of Onset  . Diabetes Mother   . Hypertension Mother   . Diabetes Maternal Grandmother   . Stroke Maternal Grandmother   . Cancer Maternal Grandfather   . Heart disease Paternal Grandfather   . Stroke Paternal Grandfather     Social History:  reports that he quit smoking about 3 years ago. His smoking use included cigarettes. He smoked 1.00 pack per day. He has never used smokeless tobacco. He reports current alcohol use of about 7.0 standard drinks of alcohol per week. He reports that he does not use drugs.  Allergies: No Known Allergies  Medications:  Scheduled: . docusate sodium  100 mg Oral BID  . enoxaparin (LOVENOX) injection  40 mg Subcutaneous Daily  . insulin aspart  0-9 Units Subcutaneous Q4H  . pantoprazole  (PROTONIX) IV  40 mg Intravenous Q12H   Continuous: . dextrose 5 % and 0.9 % NaCl with KCl 40 mEq/L 125 mL/hr at 07/08/19 0832  . piperacillin-tazobactam (ZOSYN)  IV 3.375 g (07/08/19 0750)  . vancomycin 1,750 mg (07/08/19 0835)    Results for orders placed or performed during the hospital encounter of 07/04/19 (from the past 24 hour(s))  CBC     Status: Abnormal   Collection Time: 07/08/19  5:19 AM  Result Value Ref Range   WBC 14.6 (H) 4.0 - 10.5 K/uL   RBC 4.29 4.22 - 5.81 MIL/uL   Hemoglobin 13.2 13.0 - 17.0 g/dL   HCT 38.4 (L) 39.0 - 52.0 %   MCV 89.5 80.0 - 100.0 fL   MCH 30.8 26.0 - 34.0 pg   MCHC 34.4 30.0 - 36.0 g/dL   RDW 12.3 11.5 - 15.5 %   Platelets 239 150 - 400 K/uL   nRBC 0.0 0.0 - 0.2 %  Basic metabolic panel     Status: Abnormal   Collection Time: 07/08/19  5:19 AM  Result Value Ref Range   Sodium 140 135 - 145 mmol/L   Potassium 3.6 3.5 - 5.1 mmol/L   Chloride 106 98 - 111 mmol/L   CO2 22 22 - 32 mmol/L   Glucose, Bld 103 (H) 70 - 99 mg/dL   BUN 18 6 - 20 mg/dL   Creatinine, Ser 0.98 0.61 - 1.24 mg/dL  Calcium 8.6 (L) 8.9 - 10.3 mg/dL   GFR calc non Af Amer >60 >60 mL/min   GFR calc Af Amer >60 >60 mL/min   Anion gap 12 5 - 15     CT ABDOMEN PELVIS W CONTRAST  Result Date: 07/08/2019 CLINICAL DATA:  Abdominal abscess or infection suspected. Status post perforated appendix. EXAM: CT ABDOMEN AND PELVIS WITH CONTRAST TECHNIQUE: Multidetector CT imaging of the abdomen and pelvis was performed using the standard protocol following bolus administration of intravenous contrast. CONTRAST:  55mL OMNIPAQUE IOHEXOL 300 MG/ML  SOLN COMPARISON:  07/05/2019 and 07/03/2019 FINDINGS: Lower chest: Dependent changes are identified within the lung bases posteriorly including increased ground-glass attenuation, pleural effusion and subsegmental atelectasis. Hepatobiliary: No focal liver abnormality is seen. No gallstones, gallbladder wall thickening, or biliary dilatation.  Pancreas: Unremarkable. No pancreatic ductal dilatation or surrounding inflammatory changes. Spleen: Normal in size without focal abnormality. Adrenals/Urinary Tract: Normal appearance of the adrenal glands. 2 mm stone within inferior pole of left kidney noted. No hydronephrosis identified bilaterally. Urinary bladder is unremarkable. Stomach/Bowel: The stomach is nondistended. Increase caliber of the small bowel loops are identified which measure up to 3.5 cm. There is a transition to decreased caliber distal small bowel loops within the left lower quadrant. Postoperative changes are identified within the right lower quadrant of the abdomen. There is associated inflammation, free fluid and fat stranding. Vascular/Lymphatic: Normal appearance of the abdominal aorta. Prominent ileocolic lymph nodes are identified in the right lower quadrant of the abdomen, likely reactive, image 63/3. Reproductive: Prostate is unremarkable. Other: Early, immature fluid collection, in the right lower quadrant of the abdomen measures 5.9 by 3.4 by 5.3 cm. A separate fluid collection is identified between the anterior wall of rectum and bladder measuring 4.5 x 2.9 by 2.9 cm, image 82/3. Lastly, inflammation, phlegmon and possible small linear fluid collection is identified tracking along the right pericolic gutter, image 56/6. Musculoskeletal: No acute or significant osseous findings. IMPRESSION: 1. Early, immature fluid collection, in the right lower quadrant of the abdomen is identified, which may reflect early abscess. A separate fluid collection is identified between the anterior wall of rectum and bladder and may represent an additional abscess. Inflammation, phlegmon and possible early fluid collection is also noted tracking along the right pericolic gutter towards the inferior margin of the liver. 2. Increase caliber of the small bowel loops with transition to decreased caliber distal small bowel loops within the left lower  quadrant. Findings concerning for partial small bowel obstruction versus paralytic ileus secondary to inflammation. 3. Nonobstructing left renal calculus. 4. Dependent changes are identified within the lung bases posteriorly including increased ground-glass attenuation, ground-glass attenuation, pleural effusion and subsegmental atelectasis. Electronically Signed   By: Signa Kell M.D.   On: 07/08/2019 11:26   [place] EKG SITE RITE  Result Date: 07/08/2019 If Site Rite image not attached, placement could not be confirmed due to current cardiac rhythm.   ROS:  As stated above in the HPI otherwise negative.  Blood pressure 137/81, pulse 88, temperature 99.4 F (37.4 C), temperature source Oral, resp. rate 18, height 5\' 10"  (1.778 m), weight 99.8 kg, SpO2 97 %.    PE: Gen: NAD, Alert and Oriented Neck: Supple, no LAD Lungs: CTA Bilaterally CV: RRR without M/G/R ABM: Soft, hypoactive bowel sounds, tympanic Ext: No C/C/E  Assessment/Plan: 1) Ileus. 2) Nausea/vomiting. 3) Recent perforated appendicitis. 4) Early intra-abdominal abscess.   The patient cannot currently separate his baseline GI symptoms from the current ileus.  I was not able to obtain a clear understanding of what is bothering him beyond his current issues with the ileus and the early abscess.  It will be best to wait until his acute issues resolve and then to reassess his situation, which can be performed as an outpatient. An EGD will be helpful, but this can be performed as an outpatient.  This is barring any significant change in his clinical status, ie. Hematemesis, dysphagia, etc.  Plan: 1) Continue with the current care per Surgery. 2) Signing off. 3) Upon discharge he can follow up with Dr. Loreta Ave in 1-2 weeks.  Anthem Frazer D 07/08/2019, 3:32 PM

## 2019-07-09 LAB — DIFFERENTIAL
Abs Immature Granulocytes: 0.17 10*3/uL — ABNORMAL HIGH (ref 0.00–0.07)
Basophils Absolute: 0.1 10*3/uL (ref 0.0–0.1)
Basophils Relative: 0 %
Eosinophils Absolute: 0 10*3/uL (ref 0.0–0.5)
Eosinophils Relative: 0 %
Immature Granulocytes: 1 %
Lymphocytes Relative: 9 %
Lymphs Abs: 1.1 10*3/uL (ref 0.7–4.0)
Monocytes Absolute: 0.6 10*3/uL (ref 0.1–1.0)
Monocytes Relative: 4 %
Neutro Abs: 10.9 10*3/uL — ABNORMAL HIGH (ref 1.7–7.7)
Neutrophils Relative %: 86 %

## 2019-07-09 LAB — CULTURE, BLOOD (ROUTINE X 2)
Culture: NO GROWTH
Culture: NO GROWTH
Special Requests: ADEQUATE
Special Requests: ADEQUATE

## 2019-07-09 LAB — COMPREHENSIVE METABOLIC PANEL
ALT: 80 U/L — ABNORMAL HIGH (ref 0–44)
AST: 131 U/L — ABNORMAL HIGH (ref 15–41)
Albumin: 2.1 g/dL — ABNORMAL LOW (ref 3.5–5.0)
Alkaline Phosphatase: 66 U/L (ref 38–126)
Anion gap: 9 (ref 5–15)
BUN: 12 mg/dL (ref 6–20)
CO2: 22 mmol/L (ref 22–32)
Calcium: 8.1 mg/dL — ABNORMAL LOW (ref 8.9–10.3)
Chloride: 110 mmol/L (ref 98–111)
Creatinine, Ser: 0.85 mg/dL (ref 0.61–1.24)
GFR calc Af Amer: >60 mL/min (ref >60)
GFR calc non Af Amer: >60 mL/min (ref >60)
Glucose, Bld: 149 mg/dL — ABNORMAL HIGH (ref 70–99)
Potassium: 3.5 mmol/L (ref 3.5–5.1)
Sodium: 141 mmol/L (ref 135–145)
Total Bilirubin: 0.4 mg/dL (ref 0.3–1.2)
Total Protein: 6 g/dL — ABNORMAL LOW (ref 6.5–8.1)

## 2019-07-09 LAB — CBC
HCT: 38.3 % — ABNORMAL LOW (ref 39.0–52.0)
Hemoglobin: 13 g/dL (ref 13.0–17.0)
MCH: 30.4 pg (ref 26.0–34.0)
MCHC: 33.9 g/dL (ref 30.0–36.0)
MCV: 89.5 fL (ref 80.0–100.0)
Platelets: 241 10*3/uL (ref 150–400)
RBC: 4.28 MIL/uL (ref 4.22–5.81)
RDW: 12.5 % (ref 11.5–15.5)
WBC: 12.9 10*3/uL — ABNORMAL HIGH (ref 4.0–10.5)
nRBC: 0 % (ref 0.0–0.2)

## 2019-07-09 LAB — GLUCOSE, CAPILLARY
Glucose-Capillary: 127 mg/dL — ABNORMAL HIGH (ref 70–99)
Glucose-Capillary: 94 mg/dL (ref 70–99)
Glucose-Capillary: 124 mg/dL — ABNORMAL HIGH (ref 70–99)
Glucose-Capillary: 131 mg/dL — ABNORMAL HIGH (ref 70–99)

## 2019-07-09 LAB — TRIGLYCERIDES: Triglycerides: 161 mg/dL — ABNORMAL HIGH (ref <150)

## 2019-07-09 LAB — MAGNESIUM: Magnesium: 1.9 mg/dL (ref 1.7–2.4)

## 2019-07-09 LAB — PHOSPHORUS: Phosphorus: 2.5 mg/dL (ref 2.5–4.6)

## 2019-07-09 LAB — PREALBUMIN: Prealbumin: 6 mg/dL — ABNORMAL LOW (ref 18–38)

## 2019-07-09 MED ORDER — SODIUM CHLORIDE 0.9% FLUSH
10.0000 mL | INTRAVENOUS | Status: DC | PRN
  Administered 2019-07-12: 10 mL

## 2019-07-09 MED ORDER — POTASSIUM CHLORIDE 10 MEQ/50ML IV SOLN
10.0000 meq | INTRAVENOUS | Status: DC
  Filled 2019-07-09 (×4): qty 50

## 2019-07-09 MED ORDER — CHLORHEXIDINE GLUCONATE CLOTH 2 % EX PADS
6.0000 | MEDICATED_PAD | Freq: Every day | CUTANEOUS | Status: DC
  Administered 2019-07-09 – 2019-07-16 (×7): 6 via TOPICAL

## 2019-07-09 MED ORDER — SACCHAROMYCES BOULARDII 250 MG PO CAPS
250.0000 mg | ORAL_CAPSULE | Freq: Two times a day (BID) | ORAL | Status: DC
  Administered 2019-07-13 – 2019-07-16 (×5): 250 mg via ORAL
  Filled 2019-07-09 (×11): qty 1

## 2019-07-09 MED ORDER — POTASSIUM CHLORIDE 10 MEQ/100ML IV SOLN
10.0000 meq | INTRAVENOUS | Status: AC
  Administered 2019-07-09 (×4): 10 meq via INTRAVENOUS
  Filled 2019-07-09: qty 100

## 2019-07-09 MED ORDER — ACETAMINOPHEN 10 MG/ML IV SOLN
1000.0000 mg | Freq: Once | INTRAVENOUS | Status: AC
  Administered 2019-07-09: 02:00:00 1000 mg via INTRAVENOUS
  Filled 2019-07-09: qty 100

## 2019-07-09 MED ORDER — SODIUM CHLORIDE 0.9% FLUSH
5.0000 mL | Freq: Three times a day (TID) | INTRAVENOUS | Status: DC
  Administered 2019-07-09 – 2019-07-16 (×22): 5 mL

## 2019-07-09 MED ORDER — ACETAMINOPHEN 10 MG/ML IV SOLN
1000.0000 mg | Freq: Once | INTRAVENOUS | Status: AC
  Administered 2019-07-09: 20:00:00 1000 mg via INTRAVENOUS
  Filled 2019-07-09: qty 100

## 2019-07-09 MED ORDER — KCL IN DEXTROSE-NACL 40-5-0.9 MEQ/L-%-% IV SOLN
INTRAVENOUS | Status: DC
  Filled 2019-07-09: qty 1000

## 2019-07-09 MED ORDER — TRAVASOL 10 % IV SOLN
INTRAVENOUS | Status: AC
  Filled 2019-07-09: qty 480

## 2019-07-09 MED ORDER — MAGNESIUM SULFATE 2 GM/50ML IV SOLN
2.0000 g | Freq: Once | INTRAVENOUS | Status: AC
  Administered 2019-07-09: 2 g via INTRAVENOUS
  Filled 2019-07-09: qty 50

## 2019-07-09 NOTE — Progress Notes (Signed)
PHARMACY - TOTAL PARENTERAL NUTRITION CONSULT NOTE   Indication: Prolonged ileus  Patient Measurements: Height: 5\' 10"  (177.8 cm) Weight: 99.8 kg (220 lb) IBW/kg (Calculated) : 73 TPN AdjBW (KG): 79.7 Body mass index is 31.57 kg/m.  Assessment:  50 yom presented to the hospital after an appendectomy on 4/18 with fever and vomiting. Now with possible SBO vs paralytic ileus to start TPN for nutrition support.   Glucose / Insulin: AM gluc 149  Electrolytes: K 3.5 (goal >/= 4 with ileus) - potassium added to IV fluids; Mag 1.9 (goal >/= 2 with ileus) Renal: WNL LFTs / TGs: LFTs WNL > 131/80, TG 161 Prealbumin / albumin: Alb 3.4 > 2.1; prealbumin 6 Intake / Output; MIVF: D5NS with 5/18 K/L at 162ml/hr, drain output 32m GI Imaging: 4/18 CT abd - acute appendicitis 4/20 CT abd - gas and fluid collection at the operative site compatible with a developing abscess; Wall thickening is present in the terminal ileum with developing ileus 4/23 CT abd - multiple possible abscesses; partial small bowel obstruction versus paralytic ileus secondary to inflammation Surgeries / Procedures:  4/18 Appendectomy 4/23 s/p JP drain  Central access: Double lumen PICC placed 4/24 TPN start date: 4/24 >>  Nutritional Goals (per RD recommendation on 4/23): kCal: 2400-2600, Protein: 115-135, Fluid: >=2L Goal TPN rate is 139mL/hr (provides 126 g of protein and 2577 kcals per day)  Current Nutrition:  Clear liquids  TPN   Plan:  Begin TPN at 32ml/hr This TPN provides 48 g of protein, 139 g of dextrose, and 31.8 g of lipids for a total of 983 kcals meeting 38% of patient needs Electrolytes in TPN: standard concentration; Cl:Ac 1:1 Add MVI, trace elements to TPN Initiate Sensitive q4h SSI and adjust as needed Decrease IVMF (D5NS + 43m K/L) to 85 ml/hr at 18:00 KCl 10 meq IV x4 Mag 2 g IV x1 Monitor TPN labs, f/u am labs Dietician recommendations appreciated   Thank you for involving pharmacy  in this patient's care.  , PharmD, BCPS Clinical Pharmacist Clinical phone for 07/09/2019 until 3p is [phone] 07/09/2019 7:13 AM  **Pharmacist phone directory can be found on [email or web address] listed under [facility]**

## 2019-07-09 NOTE — Progress Notes (Signed)
Patient received Toradol inj. and at the said time called RN and stated "I  Feel like the medication is making me hallucinate and also making me to shiver". Temp checked was 99.1. Reassured him and made him aware I will put a note in his chart to make the rest of the team aware of it. Otherwise stable in bed. Will cont. To monitor.

## 2019-07-09 NOTE — Progress Notes (Signed)
SH:FWYOVZCHY pain  Subjective: Pt still feels pretty bad, and just frustrated because of the situation.  IR drain is a brown purulent fluid. 135 from the drain.  PICC just placed so he should get TPN starting this PM  Objective: Vital signs in last 24 hours: Temp:  [98.4 F (36.9 C)-102 F (38.9 C)] 100.7 F (38.2 C) (04/24 0759) Pulse Rate:  [76-104] 94 (04/24 0759) Resp:  [9-20] 16 (04/24 0759) BP: (122-142)/(75-91) 129/85 (04/24 0759) SpO2:  [94 %-98 %] 96 % (04/24 0759) Last BM Date: 07/07/19 TM 102  - 0001>>100.7 this AM 0700 Pre albumin 6.0 WBC 12.9 135 from the drain recorded so far. Telem show Sinus tachycardia 3407 IV 135 drain Stools are decreased Intake/Output from previous day: 04/23 0701 - 04/24 0700 In: 3407.6 [I.V.:1939; IV Piggyback:1468.7] Out: 135 [Drains:135] Intake/Output this shift: No intake/output data recorded.  General appearance: alert, cooperative and no distress Resp: clear to auscultation bilaterally Cardio: sinus tachycardia GI: soft sore, IR drain shows brown purulent fluid, NO BS.  NO flatus  Lab Results:  Recent Labs    07/08/19 0519 07/09/19 0237  WBC 14.6* 12.9*  HGB 13.2 13.0  HCT 38.4* 38.3*  PLT 239 241    BMET Recent Labs    07/08/19 0519 07/09/19 0237  NA 140 141  K 3.6 3.5  CL 106 110  CO2 22 22  GLUCOSE 103* 149*  BUN 18 12  CREATININE 0.98 0.85  CALCIUM 8.6* 8.1*   PT/INR No results for input(s): LABPROT, INR in the last 72 hours.  Recent Labs  Lab 07/03/19 1126 07/04/19 2229 07/09/19 0237  AST 22 24 131*  ALT 18 16 80*  ALKPHOS 79 66 66  BILITOT 1.3* 1.4* 0.4  PROT 7.7 7.4 6.0*  ALBUMIN 4.4 3.4* 2.1*     Lipase  No results found for: LIPASE   Medications: . Chlorhexidine Gluconate Cloth  6 each Topical Daily  . docusate sodium  100 mg Oral BID  . enoxaparin (LOVENOX) injection  40 mg Subcutaneous Daily  . insulin aspart  0-9 Units Subcutaneous Q4H  . pantoprazole (PROTONIX) IV  40  mg Intravenous Q12H  . sodium chloride flush  5 mL Intracatheter Q8H    Assessment/Plan Hx GERD/nausea/vomiting at home Severe malnutrition - Prealbumin 6.0   s/p Laparoscopic Appendectomy for perforated appendicitis (contained) - Dr. Maisie Fus 4/18- 07/04/19 Readmit 07/05/19 Post-Op Intra-abdominal fluid collection Reactive ileus - Fluid collection does not appear amenable to IR drainage - Continue IV abx. Vanc added overnight - WBC 17.9(4/19)>>15.0(4/21)>>12.7(4/22)>>14.6(4/23) - Repeat CT shows increased fluid collection  - IR drain 4/23 - 45 cc foul smelling purulent fluid -Cont IVF >> adding PICC/TPN - AM labs - Mobilize, IS  FEN -Clears, VTE -SCDs, Lovenox ID -Zosyn/Vanc4/20 >>Tmax 103.2,>>100.9>>100.1>>102>>100.7  Plan:  TPN should start later this PM. Continue clears as tolerated, mobilize.  Tranfer to 6N. Continue IR Drain.         LOS: 4 days    JENNINGS,WILLARD 07/09/2019 Please see Amion  Pt seen & examined this evening GF at bedside  Feels better today/this evening than yesterday No flatus Having some liquid BM Spent some time in chair but no walk Nausea much improved  Alert, resting comfort Soft, mild distension, approp TTP Drain - purulent  F/u culture Cont IV abx Ambulate, oob, walk discussed importance Picc/tpn Answered their questions Not sure if he had reaction to toradol the other night. Wants to try it again. Informed it is avail as prn  Leighton Ruff. Redmond Pulling, MD, FACS General, Bariatric, & Minimally Invasive Surgery [facility], [place]

## 2019-07-09 NOTE — Progress Notes (Signed)
Report called to 6N RN, Daisy  Pt transported via wheelchair with RN and family  Pt has all belongings

## 2019-07-09 NOTE — Progress Notes (Signed)
Peripherally Inserted Central Catheter Placement  The IV Nurse has discussed with the patient and/or persons authorized to consent for the patient, the purpose of this procedure and the potential benefits and risks involved with this procedure.  The benefits include less needle sticks, lab draws from the catheter, and the patient may be discharged home with the catheter. Risks include, but not limited to, infection, bleeding, blood clot (thrombus formation), and puncture of an artery; nerve damage and irregular heartbeat and possibility to perform a PICC exchange if needed/ordered by physician.  Alternatives to this procedure were also discussed.  Bard Power PICC patient education guide, fact sheet on infection prevention and patient information card has been provided to patient /or left at bedside.    PICC Placement Documentation  PICC Double Lumen 07/09/19 PICC Right Basilic 38 cm 0 cm (Active)  Indication for Insertion or Continuance of Line Administration of hyperosmolar/irritating solutions (i.e. TPN, Vancomycin, etc.) 07/09/19 0900  Exposed Catheter (cm) 0 cm 07/09/19 0900  Site Assessment Clean;Dry;Intact 07/09/19 0900  Lumen #1 Status Flushed;Blood return noted;Saline locked 07/09/19 0900  Lumen #2 Status Flushed;Blood return noted;Saline locked 07/09/19 0900  Dressing Type Transparent 07/09/19 0900  Dressing Status Clean;Dry;Intact;Antimicrobial disc in place 07/09/19 0900  Safety Lock Not Applicable 07/09/19 0900  Line Care Connections checked and tightened 07/09/19 0900  Line Adjustment (NICU/IV Team Only) No 07/09/19 0900  Dressing Change Due 07/16/19 07/09/19 0900       Edwin Cap 07/09/2019, 9:18 AM

## 2019-07-09 NOTE — Progress Notes (Signed)
MEDICATION-RELATED CONSULT NOTE   IR Procedure Consult - Anticoagulant/Antiplatelet PTA/Inpatient Med List Review by Pharmacist    Procedure: CT image guided intra-abdominal abscess drainage by percutaneous catheter    Completed: 07/08/2019 at 1840   Post-Procedural bleeding risk per IR MD assessment:  Low  Antithrombotic medications on inpatient or PTA profile prior to procedure:    Prophylactic Lovenox 40 mg daily, last given 07/08/2019 at 0835   Recommended restart time per IR Post-Procedure Guidelines:   At least 4 hours after or at next standard dose interval  Other considerations:   Hemoglobin 13, Platelet count 241  Plan:     Continue prophylactic Lovenox dosing next scheduled for today (07/09/2019) at 1000 Monitor for signs/symptoms of bleeding

## 2019-07-09 NOTE — Progress Notes (Signed)
Referring Physician(s): Thompson,B  Supervising Physician: Malachy Moan  Patient Status:  [facility] - In-pt  Chief Complaint: Abdominal pain,nausea/vomiting   Subjective: Pt doing fair this am; still having some RLQ discomfort, intermittent N/V   Allergies: Patient has no known allergies.  Medications: Prior to Admission medications   Medication Sig Start Date End Date Taking? Authorizing Provider  acetaminophen (TYLENOL) 500 MG tablet Take 2 tablets (1,000 mg total) by mouth every 6 (six) hours. 07/04/19  Yes SimaanFrancine Graven, PA-C  amoxicillin-clavulanate (AUGMENTIN) 875-125 MG tablet Take 1 tablet by mouth 2 (two) times daily for 5 days. 07/04/19 07/09/19 Yes Barnetta Chapel, PA-C  DEXILANT 60 MG capsule Take 1 capsule by mouth daily. 07/03/19  Yes [provider]  ondansetron (ZOFRAN) 4 MG tablet Take 4 mg by mouth every 6 (six) hours as needed. 07/04/19  Yes [provider]  oxyCODONE (OXY IR/ROXICODONE) 5 MG immediate release tablet Take 1 tablet (5 mg total) by mouth every 6 (six) hours as needed for severe pain (not relieved by tylenol or ibuprofen). 07/04/19  Yes Adam Phenix, PA-C     Vital Signs: BP 129/85 (BP Location: Left Arm)   Pulse 94   Temp (!) 100.7 F (38.2 C) (Tympanic)   Resp 16   Ht 5\' 10"  (1.778 m)   Wt 220 lb (99.8 kg)   SpO2 96%   BMI 31.57 kg/m   Physical Exam awake/alert; RLQ drain intact, insertion site ok, mild-mod tender, OP 25 cc today brown fluid, 135 cc yesterday; drain flushed without difficulty  Imaging: CT ABDOMEN PELVIS W CONTRAST  Result Date: 07/08/2019 CLINICAL DATA:  Abdominal abscess or infection suspected. Status post perforated appendix. EXAM: CT ABDOMEN AND PELVIS WITH CONTRAST TECHNIQUE: Multidetector CT imaging of the abdomen and pelvis was performed using the standard protocol following bolus administration of intravenous contrast. CONTRAST:  71mL OMNIPAQUE IOHEXOL 300 MG/ML  SOLN COMPARISON:   07/05/2019 and 07/03/2019 FINDINGS: Lower chest: Dependent changes are identified within the lung bases posteriorly including increased ground-glass attenuation, pleural effusion and subsegmental atelectasis. Hepatobiliary: No focal liver abnormality is seen. No gallstones, gallbladder wall thickening, or biliary dilatation. Pancreas: Unremarkable. No pancreatic ductal dilatation or surrounding inflammatory changes. Spleen: Normal in size without focal abnormality. Adrenals/Urinary Tract: Normal appearance of the adrenal glands. 2 mm stone within inferior pole of left kidney noted. No hydronephrosis identified bilaterally. Urinary bladder is unremarkable. Stomach/Bowel: The stomach is nondistended. Increase caliber of the small bowel loops are identified which measure up to 3.5 cm. There is a transition to decreased caliber distal small bowel loops within the left lower quadrant. Postoperative changes are identified within the right lower quadrant of the abdomen. There is associated inflammation, free fluid and fat stranding. Vascular/Lymphatic: Normal appearance of the abdominal aorta. Prominent ileocolic lymph nodes are identified in the right lower quadrant of the abdomen, likely reactive, image 63/3. Reproductive: Prostate is unremarkable. Other: Early, immature fluid collection, in the right lower quadrant of the abdomen measures 5.9 by 3.4 by 5.3 cm. A separate fluid collection is identified between the anterior wall of rectum and bladder measuring 4.5 x 2.9 by 2.9 cm, image 82/3. Lastly, inflammation, phlegmon and possible small linear fluid collection is identified tracking along the right pericolic gutter, image 56/6. Musculoskeletal: No acute or significant osseous findings. IMPRESSION: 1. Early, immature fluid collection, in the right lower quadrant of the abdomen is identified, which may reflect early abscess. A separate fluid collection is identified between the anterior wall of  rectum and bladder and  may represent an additional abscess. Inflammation, phlegmon and possible early fluid collection is also noted tracking along the right pericolic gutter towards the inferior margin of the liver. 2. Increase caliber of the small bowel loops with transition to decreased caliber distal small bowel loops within the left lower quadrant. Findings concerning for partial small bowel obstruction versus paralytic ileus secondary to inflammation. 3. Nonobstructing left renal calculus. 4. Dependent changes are identified within the lung bases posteriorly including increased ground-glass attenuation, ground-glass attenuation, pleural effusion and subsegmental atelectasis. Electronically Signed   By: Signa Kell M.D.   On: 07/08/2019 11:26   CT IMAGE GUIDED DRAINAGE BY PERCUTANEOUS CATHETER  Result Date: 07/08/2019 INDICATION: 29 year old male with a history of perforated appendicitis status post laparoscopic appendectomy. He now has an intra-abdominal fluid and gas collection consistent with postoperative abscess. He presents for placement of a CT-guided drain EXAM: CT-guided drain placement MEDICATIONS: The patient is currently admitted to the hospital and receiving intravenous antibiotics. The antibiotics were administered within an appropriate time frame prior to the initiation of the procedure. ANESTHESIA/SEDATION: Fentanyl 3.5 mcg IV; Versed 175 mg IV Moderate Sedation Time:  17 minutes The patient was continuously monitored during the procedure by the interventional radiology nurse under my direct supervision. COMPLICATIONS: None immediate. PROCEDURE: Informed written consent was obtained from the patient after a thorough discussion of the procedural risks, benefits and alternatives. All questions were addressed. A timeout was performed prior to the initiation of the procedure. A planning axial CT scan was performed. The fluid and gas collection was successfully identified. A suitable skin entry site was selected and  marked. The overlying skin was sterilely prepped and draped in the standard fashion using chlorhexidine skin prep. Local anesthesia was attained by infiltration with 1% lidocaine. A small dermatotomy was made. Under intermittent CT guidance, an 18 gauge trocar needle was carefully advanced through the anterior abdominal wall and into the fluid collection. An Amplatz wire was then coiled in the fluid collection. The skin tract was dilated to 10 [place]. A Cook 10.2 [place] all-purpose drainage catheter was then advanced over the wire and formed in the fluid and gas collection. Aspiration yields approximately 45 mL of foul-smelling purulent fluid. A sample of the fluid was sent for Gram stain and culture. The catheter was gently flushed and connected to JP bulb suction. The catheter was secured to the skin with 0 Prolene suture and sterile bandages were applied. Follow-up CT imaging demonstrates a well-positioned drainage catheter. IMPRESSION: Successful placement of 10 French drainage catheter into the postoperative abscess. Aspiration yields 45 mL of thick, foul-smelling purulent fluid. Signed, Sterling Big, MD, RPVI [facility] Radiology Electronically Signed   By: Malachy Moan M.D.   On: 07/08/2019 19:26   [place] EKG SITE RITE  Result Date: 07/08/2019 If Site Rite image not attached, placement could not be confirmed due to current cardiac rhythm.   Labs:  CBC: Recent Labs    07/06/19 0545 07/07/19 0449 07/08/19 0519 07/09/19 0237  WBC 15.0* 12.7* 14.6* 12.9*  HGB 13.4 12.7* 13.2 13.0  HCT 38.7* 36.6* 38.4* 38.3*  PLT 183 195 239 241    COAGS: Recent Labs    07/03/19 1126 07/04/19 2229  INR 1.1 1.4*  APTT 28  --     BMP: Recent Labs    07/06/19 0545 07/07/19 0449 07/08/19 0519 07/09/19 0237  NA 138 138 140 141  K 3.6 3.7 3.6 3.5  CL  104 106 106 110  CO2 22 22 22 22   GLUCOSE 107* 101* 103* 149*  BUN 18 19 18 12     CALCIUM 8.5* 8.5* 8.6* 8.1*  CREATININE 1.12 1.03 0.98 0.85  GFRNONAA >60 >60 >60 >60  GFRAA >60 >60 >60 >60    LIVER FUNCTION TESTS: Recent Labs    07/03/19 1126 07/04/19 2229 07/09/19 0237  BILITOT 1.3* 1.4* 0.4  AST 22 24 131*  ALT 18 16 80*  ALKPHOS 79 66 66  PROT 7.7 7.4 6.0*  ALBUMIN 4.4 3.4* 2.1*    Assessment and Plan: Pt with hx perf appendicitis, s/p lap appy with postop RLQ abscess; s/p RLQ drain 4/23; temp 100.7, WBC 12.9(14.6), hgb stable; creat nl; drain fl cx pend; cont drain irrigation/OP monitoring; once OP minimal(10-15 cc/day for 2-3 consecutive days) or if clinical status worsens obtain f/u CT; will likely need drain injection before removal   Electronically Signed: D. Rowe Robert, PA-C 07/09/2019, 11:45 AM   I spent a total of 15 minutes at the the patient's bedside AND on the patient's hospital floor or unit, greater than 50% of which was counseling/coordinating care for RLQ abscess drain    Patient ID: Matthew Pace, male   DOB: 05/24/1990, 29 y.o.   MRN: [id number]

## 2019-07-10 LAB — CBC WITH DIFFERENTIAL/PLATELET
Abs Immature Granulocytes: 0.45 10*3/uL — ABNORMAL HIGH (ref 0.00–0.07)
Basophils Absolute: 0.1 10*3/uL (ref 0.0–0.1)
Basophils Relative: 1 %
Eosinophils Absolute: 0.1 10*3/uL (ref 0.0–0.5)
Eosinophils Relative: 1 %
HCT: 37 % — ABNORMAL LOW (ref 39.0–52.0)
Hemoglobin: 12.5 g/dL — ABNORMAL LOW (ref 13.0–17.0)
Immature Granulocytes: 3 %
Lymphocytes Relative: 11 %
Lymphs Abs: 1.6 10*3/uL (ref 0.7–4.0)
MCH: 30.4 pg (ref 26.0–34.0)
MCHC: 33.8 g/dL (ref 30.0–36.0)
MCV: 90 fL (ref 80.0–100.0)
Monocytes Absolute: 0.6 10*3/uL (ref 0.1–1.0)
Monocytes Relative: 4 %
Neutro Abs: 11 10*3/uL — ABNORMAL HIGH (ref 1.7–7.7)
Neutrophils Relative %: 80 %
Platelets: 228 10*3/uL (ref 150–400)
RBC: 4.11 MIL/uL — ABNORMAL LOW (ref 4.22–5.81)
RDW: 12.2 % (ref 11.5–15.5)
WBC: 13.8 10*3/uL — ABNORMAL HIGH (ref 4.0–10.5)
nRBC: 0 % (ref 0.0–0.2)

## 2019-07-10 LAB — COMPREHENSIVE METABOLIC PANEL
ALT: 58 U/L — ABNORMAL HIGH (ref 0–44)
AST: 57 U/L — ABNORMAL HIGH (ref 15–41)
Albumin: 1.9 g/dL — ABNORMAL LOW (ref 3.5–5.0)
Alkaline Phosphatase: 60 U/L (ref 38–126)
Anion gap: 7 (ref 5–15)
BUN: 8 mg/dL (ref 6–20)
CO2: 22 mmol/L (ref 22–32)
Calcium: 7.9 mg/dL — ABNORMAL LOW (ref 8.9–10.3)
Chloride: 109 mmol/L (ref 98–111)
Creatinine, Ser: 0.73 mg/dL (ref 0.61–1.24)
GFR calc Af Amer: >60 mL/min (ref >60)
GFR calc non Af Amer: >60 mL/min (ref >60)
Glucose, Bld: 126 mg/dL — ABNORMAL HIGH (ref 70–99)
Potassium: 3.6 mmol/L (ref 3.5–5.1)
Sodium: 138 mmol/L (ref 135–145)
Total Bilirubin: 0.7 mg/dL (ref 0.3–1.2)
Total Protein: 5.6 g/dL — ABNORMAL LOW (ref 6.5–8.1)

## 2019-07-10 LAB — MAGNESIUM: Magnesium: 2.1 mg/dL (ref 1.7–2.4)

## 2019-07-10 LAB — PHOSPHORUS: Phosphorus: 3 mg/dL (ref 2.5–4.6)

## 2019-07-10 LAB — GLUCOSE, CAPILLARY
Glucose-Capillary: 106 mg/dL — ABNORMAL HIGH (ref 70–99)
Glucose-Capillary: 108 mg/dL — ABNORMAL HIGH (ref 70–99)
Glucose-Capillary: 114 mg/dL — ABNORMAL HIGH (ref 70–99)
Glucose-Capillary: 118 mg/dL — ABNORMAL HIGH (ref 70–99)
Glucose-Capillary: 124 mg/dL — ABNORMAL HIGH (ref 70–99)

## 2019-07-10 MED ORDER — KCL IN DEXTROSE-NACL 40-5-0.9 MEQ/L-%-% IV SOLN
INTRAVENOUS | Status: AC
  Filled 2019-07-10 (×2): qty 1000

## 2019-07-10 MED ORDER — TRAVASOL 10 % IV SOLN
INTRAVENOUS | Status: AC
  Filled 2019-07-10: qty 1260

## 2019-07-10 MED ORDER — POTASSIUM CHLORIDE 10 MEQ/50ML IV SOLN
10.0000 meq | INTRAVENOUS | Status: AC
  Administered 2019-07-10 (×4): 10 meq via INTRAVENOUS
  Filled 2019-07-10 (×4): qty 50

## 2019-07-10 MED ORDER — VANCOMYCIN HCL 1750 MG/350ML IV SOLN
1750.0000 mg | Freq: Two times a day (BID) | INTRAVENOUS | Status: DC
  Administered 2019-07-10 – 2019-07-11 (×2): 1750 mg via INTRAVENOUS
  Filled 2019-07-10 (×3): qty 350

## 2019-07-10 MED ORDER — KCL IN DEXTROSE-NACL 40-5-0.9 MEQ/L-%-% IV SOLN
INTRAVENOUS | Status: DC
  Filled 2019-07-10 (×2): qty 1000

## 2019-07-10 NOTE — Progress Notes (Signed)
Patient ID: Matthew Pace, male   DOB: 23-Dec-1990, 29 y.o.   MRN: [id number] [facility] Surgery Progress Note:   * No surgery found *  Subjective: Mental status is alert.  Complaints pain at drain site; nausea. Objective: Vital signs in last 24 hours: Temp:  [98.2 F (36.8 C)-100.2 F (37.9 C)] 98.2 F (36.8 C) (04/25 0420) Pulse Rate:  [90-103] 100 (04/25 0420) Resp:  [16-18] 18 (04/25 0420) BP: (139-152)/(83-94) 139/84 (04/25 0420) SpO2:  [95 %-97 %] 96 % (04/25 0420)  Intake/Output from previous day: 04/24 0701 - 04/25 0700 In: 3688.3 [P.O.:60; I.V.:2963.3; IV Piggyback:655] Out: 55 [Drains:55] Intake/Output this shift: No intake/output data recorded.  Physical Exam: Work of breathing is not labored.  Soreness in lower abdomen.  Drain with purulent drainage-cultures show abundant gm neg rods  Lab Results:  Results for orders placed or performed during the hospital encounter of 07/04/19 (from the past 48 hour(s))  Glucose, capillary     Status: Abnormal   Collection Time: 07/08/19  4:23 PM  Result Value Ref Range   Glucose-Capillary 117 (H) 70 - 99 mg/dL    Comment: Glucose reference range applies only to samples taken after fasting for at least 8 hours.  Aerobic/Anaerobic Culture (surgical/deep wound)     Status: None (Preliminary result)   Collection Time: 07/08/19  6:41 PM   Specimen: Abscess  Result Value Ref Range   Specimen Description ABSCESS    Special Requests Normal    Gram Stain      MODERATE WBC PRESENT, PREDOMINANTLY PMN ABUNDANT GRAM NEGATIVE RODS ABUNDANT GRAM POSITIVE COCCI IN CLUSTERS FEW GRAM POSITIVE RODS Performed at [facility], [place] [address], [place], [place] [address]    Culture ABUNDANT GRAM NEGATIVE RODS    Report Status PENDING   CBC     Status: Abnormal   Collection Time: 07/09/19  2:37 AM  Result Value Ref Range   WBC 12.9 (H) 4.0 - 10.5 K/uL   RBC 4.28 4.22 - 5.81 MIL/uL   Hemoglobin 13.0 13.0 - 17.0 g/dL   HCT 38.3 (L)  39.0 - 52.0 %   MCV 89.5 80.0 - 100.0 fL   MCH 30.4 26.0 - 34.0 pg   MCHC 33.9 30.0 - 36.0 g/dL   RDW 12.5 11.5 - 15.5 %   Platelets 241 150 - 400 K/uL   nRBC 0.0 0.0 - 0.2 %    Comment: Performed at [facility], [place] [address], [place], [place] [address]  Magnesium     Status: None   Collection Time: 07/09/19  2:37 AM  Result Value Ref Range   Magnesium 1.9 1.7 - 2.4 mg/dL    Comment: Performed at [place] [address], [place], [place] [address]  Comprehensive metabolic panel     Status: Abnormal   Collection Time: 07/09/19  2:37 AM  Result Value Ref Range   Sodium 141 135 - 145 mmol/L   Potassium 3.5 3.5 - 5.1 mmol/L   Chloride 110 98 - 111 mmol/L   CO2 22 22 - 32 mmol/L   Glucose, Bld 149 (H) 70 - 99 mg/dL    Comment: Glucose reference range applies only to samples taken after fasting for at least 8 hours.   BUN 12 6 - 20 mg/dL   Creatinine, Ser 0.85 0.61 - 1.24 mg/dL   Calcium 8.1 (L) 8.9 - 10.3 mg/dL   Total Protein 6.0 (L) 6.5 - 8.1 g/dL   Albumin 2.1 (L)  3.5 - 5.0 g/dL   AST 409131 (H) 15 - 41 U/L   ALT 80 (H) 0 - 44 U/L   Alkaline Phosphatase 66 38 - 126 U/L   Total Bilirubin 0.4 0.3 - 1.2 mg/dL   GFR calc non Af Amer >60 >60 mL/min   GFR calc Af Amer >60 >60 mL/min   Anion gap 9 5 - 15    Comment: Performed at [facility], [address] [place], [place] [address]  Prealbumin     Status: Abnormal   Collection Time: 07/09/19  2:37 AM  Result Value Ref Range   Prealbumin 6.0 (L) 18 - 38 mg/dL    Comment: Performed at [facility], [address] [place] [address] [place], [place] [address]  Phosphorus     Status: None   Collection Time: 07/09/19  2:37 AM  Result Value Ref Range   Phosphorus 2.5 2.5 - 4.6 mg/dL    Comment: Performed at [facility], [address] [place], [place] [address]  Differential     Status: Abnormal   Collection Time: 07/09/19  2:37 AM  Result Value Ref Range   Neutrophils Relative % 86 %   Neutro Abs 10.9 (H) 1.7  - 7.7 K/uL   Lymphocytes Relative 9 %   Lymphs Abs 1.1 0.7 - 4.0 K/uL   Monocytes Relative 4 %   Monocytes Absolute 0.6 0.1 - 1.0 K/uL   Eosinophils Relative 0 %   Eosinophils Absolute 0.0 0.0 - 0.5 K/uL   Basophils Relative 0 %   Basophils Absolute 0.1 0.0 - 0.1 K/uL   WBC Morphology MORPHOLOGY UNREMARKABLE    Immature Granulocytes 1 %   Abs Immature Granulocytes 0.17 (H) 0.00 - 0.07 K/uL    Comment: Performed at [facility], [address] [place], [place] [address]  Triglycerides     Status: Abnormal   Collection Time: 07/09/19  2:37 AM  Result Value Ref Range   Triglycerides 161 (H) <150 mg/dL    Comment: Performed at [facility], [address] [place], [place] [address]  Glucose, capillary     Status: Abnormal   Collection Time: 07/09/19  8:05 AM  Result Value Ref Range   Glucose-Capillary 127 (H) 70 - 99 mg/dL    Comment: Glucose reference range applies only to samples taken after fasting for at least 8 hours.   Comment 1 Notify RN   Glucose, capillary     Status: None   Collection Time: 07/09/19 11:22 AM  Result Value Ref Range   Glucose-Capillary 94 70 - 99 mg/dL    Comment: Glucose reference range applies only to samples taken after fasting for at least 8 hours.  Glucose, capillary     Status: Abnormal   Collection Time: 07/09/19  4:29 PM  Result Value Ref Range   Glucose-Capillary 124 (H) 70 - 99 mg/dL    Comment: Glucose reference range applies only to samples taken after fasting for at least 8 hours.  Glucose, capillary     Status: Abnormal   Collection Time: 07/09/19  7:33 PM  Result Value Ref Range   Glucose-Capillary 131 (H) 70 - 99 mg/dL    Comment: Glucose reference range applies only to samples taken after fasting for at least 8 hours.  Glucose, capillary     Status: Abnormal   Collection Time: 07/10/19 12:11 AM  Result Value Ref Range   Glucose-Capillary 108 (H) 70 - 99 mg/dL  Comment: Glucose reference range applies only to samples  taken after fasting for at least 8 hours.  Comprehensive metabolic panel     Status: Abnormal   Collection Time: 07/10/19  4:10 AM  Result Value Ref Range   Sodium 138 135 - 145 mmol/L   Potassium 3.6 3.5 - 5.1 mmol/L   Chloride 109 98 - 111 mmol/L   CO2 22 22 - 32 mmol/L   Glucose, Bld 126 (H) 70 - 99 mg/dL    Comment: Glucose reference range applies only to samples taken after fasting for at least 8 hours.   BUN 8 6 - 20 mg/dL   Creatinine, Ser 7.90 0.61 - 1.24 mg/dL   Calcium 7.9 (L) 8.9 - 10.3 mg/dL   Total Protein 5.6 (L) 6.5 - 8.1 g/dL   Albumin 1.9 (L) 3.5 - 5.0 g/dL   AST 57 (H) 15 - 41 U/L   ALT 58 (H) 0 - 44 U/L   Alkaline Phosphatase 60 38 - 126 U/L   Total Bilirubin 0.7 0.3 - 1.2 mg/dL   GFR calc non Af Amer >60 >60 mL/min   GFR calc Af Amer >60 >60 mL/min   Anion gap 7 5 - 15    Comment: Performed at [facility], [address] [place], [place] [address]  Magnesium     Status: None   Collection Time: 07/10/19  4:10 AM  Result Value Ref Range   Magnesium 2.1 1.7 - 2.4 mg/dL    Comment: Performed at [facility], [address] [place], [place] [address]  Phosphorus     Status: None   Collection Time: 07/10/19  4:10 AM  Result Value Ref Range   Phosphorus 3.0 2.5 - 4.6 mg/dL    Comment: Performed at [facility], [address], [place], [place] [address]  Glucose, capillary     Status: Abnormal   Collection Time: 07/10/19  4:20 AM  Result Value Ref Range   Glucose-Capillary 118 (H) 70 - 99 mg/dL    Comment: Glucose reference range applies only to samples taken after fasting for at least 8 hours.  Glucose, capillary     Status: Abnormal   Collection Time: 07/10/19  7:37 AM  Result Value Ref Range   Glucose-Capillary 124 (H) 70 - 99 mg/dL    Comment: Glucose reference range applies only to samples taken after fasting for at least 8 hours.    Radiology/Results: CT ABDOMEN PELVIS W CONTRAST  Result Date: 07/08/2019 CLINICAL DATA:  Abdominal  abscess or infection suspected. Status post perforated appendix. EXAM: CT ABDOMEN AND PELVIS WITH CONTRAST TECHNIQUE: Multidetector CT imaging of the abdomen and pelvis was performed using the standard protocol following bolus administration of intravenous contrast. CONTRAST:  51mL OMNIPAQUE IOHEXOL 300 MG/ML  SOLN COMPARISON:  07/05/2019 and 07/03/2019 FINDINGS: Lower chest: Dependent changes are identified within the lung bases posteriorly including increased ground-glass attenuation, pleural effusion and subsegmental atelectasis. Hepatobiliary: No focal liver abnormality is seen. No gallstones, gallbladder wall thickening, or biliary dilatation. Pancreas: Unremarkable. No pancreatic ductal dilatation or surrounding inflammatory changes. Spleen: Normal in size without focal abnormality. Adrenals/Urinary Tract: Normal appearance of the adrenal glands. 2 mm stone within inferior pole of left kidney noted. No hydronephrosis identified bilaterally. Urinary bladder is unremarkable. Stomach/Bowel: The stomach is nondistended. Increase caliber of the small bowel loops are identified which measure up to 3.5 cm. There is a transition to decreased caliber distal small bowel loops within the left lower  quadrant. Postoperative changes are identified within the right lower quadrant of the abdomen. There is associated inflammation, free fluid and fat stranding. Vascular/Lymphatic: Normal appearance of the abdominal aorta. Prominent ileocolic lymph nodes are identified in the right lower quadrant of the abdomen, likely reactive, image 63/3. Reproductive: Prostate is unremarkable. Other: Early, immature fluid collection, in the right lower quadrant of the abdomen measures 5.9 by 3.4 by 5.3 cm. A separate fluid collection is identified between the anterior wall of rectum and bladder measuring 4.5 x 2.9 by 2.9 cm, image 82/3. Lastly, inflammation, phlegmon and possible small linear fluid collection is identified tracking along the  right pericolic gutter, image 56/6. Musculoskeletal: No acute or significant osseous findings. IMPRESSION: 1. Early, immature fluid collection, in the right lower quadrant of the abdomen is identified, which may reflect early abscess. A separate fluid collection is identified between the anterior wall of rectum and bladder and may represent an additional abscess. Inflammation, phlegmon and possible early fluid collection is also noted tracking along the right pericolic gutter towards the inferior margin of the liver. 2. Increase caliber of the small bowel loops with transition to decreased caliber distal small bowel loops within the left lower quadrant. Findings concerning for partial small bowel obstruction versus paralytic ileus secondary to inflammation. 3. Nonobstructing left renal calculus. 4. Dependent changes are identified within the lung bases posteriorly including increased ground-glass attenuation, ground-glass attenuation, pleural effusion and subsegmental atelectasis. Electronically Signed   By: Signa Kell M.D.   On: 07/08/2019 11:26   CT IMAGE GUIDED DRAINAGE BY PERCUTANEOUS CATHETER  Result Date: 07/08/2019 INDICATION: 29 year old male with a history of perforated appendicitis status post laparoscopic appendectomy. He now has an intra-abdominal fluid and gas collection consistent with postoperative abscess. He presents for placement of a CT-guided drain EXAM: CT-guided drain placement MEDICATIONS: The patient is currently admitted to the hospital and receiving intravenous antibiotics. The antibiotics were administered within an appropriate time frame prior to the initiation of the procedure. ANESTHESIA/SEDATION: Fentanyl 3.5 mcg IV; Versed 175 mg IV Moderate Sedation Time:  17 minutes The patient was continuously monitored during the procedure by the interventional radiology nurse under my direct supervision. COMPLICATIONS: None immediate. PROCEDURE: Informed written consent was obtained from  the patient after a thorough discussion of the procedural risks, benefits and alternatives. All questions were addressed. A timeout was performed prior to the initiation of the procedure. A planning axial CT scan was performed. The fluid and gas collection was successfully identified. A suitable skin entry site was selected and marked. The overlying skin was sterilely prepped and draped in the standard fashion using chlorhexidine skin prep. Local anesthesia was attained by infiltration with 1% lidocaine. A small dermatotomy was made. Under intermittent CT guidance, an 18 gauge trocar needle was carefully advanced through the anterior abdominal wall and into the fluid collection. An Amplatz wire was then coiled in the fluid collection. The skin tract was dilated to 10 [place]. A Cook 10.2 [place] all-purpose drainage catheter was then advanced over the wire and formed in the fluid and gas collection. Aspiration yields approximately 45 mL of foul-smelling purulent fluid. A sample of the fluid was sent for Gram stain and culture. The catheter was gently flushed and connected to JP bulb suction. The catheter was secured to the skin with 0 Prolene suture and sterile bandages were applied. Follow-up CT imaging demonstrates a well-positioned drainage catheter. IMPRESSION: Successful placement of 10 French drainage catheter into the postoperative abscess. Aspiration yields 45 mL of thick,  foul-smelling purulent fluid. Signed, Sterling Big, MD, RPVI [facility] Radiology Electronically Signed   By: Malachy Moan M.D.   On: 07/08/2019 19:26   [place] EKG SITE RITE  Result Date: 07/08/2019 If Site Rite image not attached, placement could not be confirmed due to current cardiac rhythm.   Anti-infectives: Anti-infectives (From admission, onward)   Start     Dose/Rate Route Frequency Ordered Stop   07/05/19 2200  vancomycin (VANCOREADY) IVPB 1750 mg/350 mL     1,750  mg 175 mL/hr over 120 Minutes Intravenous Every 12 hours 07/05/19 2149     07/05/19 1800  vancomycin (VANCOREADY) IVPB 1750 mg/350 mL  Status:  Discontinued     1,750 mg 175 mL/hr over 120 Minutes Intravenous Every 12 hours 07/05/19 0635 07/05/19 0745   07/05/19 1500  piperacillin-tazobactam (ZOSYN) IVPB 3.375 g     3.375 g 12.5 mL/hr over 240 Minutes Intravenous Every 8 hours 07/05/19 0635     07/05/19 0630  vancomycin (VANCOREADY) IVPB 2000 mg/400 mL  Status:  Discontinued     2,000 mg 200 mL/hr over 120 Minutes Intravenous  Once 07/05/19 0621 07/05/19 0805   07/05/19 0615  vancomycin (VANCOCIN) IVPB 1000 mg/200 mL premix  Status:  Discontinued     1,000 mg 200 mL/hr over 60 Minutes Intravenous  Once 07/05/19 0607 07/05/19 0621   07/05/19 0615  piperacillin-tazobactam (ZOSYN) IVPB 3.375 g     3.375 g 12.5 mL/hr over 240 Minutes Intravenous  Once 07/05/19 9702 07/05/19 1154      Assessment/Plan: Problem List: Patient Active Problem List   Diagnosis Date Noted  . Intraabdominal fluid collection 07/05/2019  . Perforated appendicitis 07/03/2019    Post lap appy abscesses-drain in place with purulent drainage.  On Zosyn and Vancomycin.  TNA.  Await rescan to assess for other abscess formation.   * No surgery found *    LOS: 5 days   Matt B. Daphine Deutscher, MD, [facility] [phone] to reach the surgeon on call.    07/10/2019 9:39 AM

## 2019-07-10 NOTE — Progress Notes (Signed)
PHARMACY - TOTAL PARENTERAL NUTRITION CONSULT NOTE   Indication: Prolonged ileus  Patient Measurements: Height: 5\' 10"  (177.8 cm) Weight: 99.8 kg (220 lb) IBW/kg (Calculated) : 73 TPN AdjBW (KG): 79.7 Body mass index is 31.57 kg/m.  Assessment:  65 yom presented to the hospital after an appendectomy on 4/18 with fever and vomiting. Now with possible SBO vs paralytic ileus to start TPN for nutrition support.   Glucose / Insulin: CBGs <150, 2 units given since TPN started Electrolytes: K 3.6 (goal >/= 4 with ileus) - potassium added to IV fluids; Mag 2.1 (goal >/= 2 with ileus) Renal: WNL LFTs / TGs: LFTs down 57/58, TG 161 Prealbumin / albumin: Alb 3.4 > 2.1; prealbumin 6 Intake / Output; MIVF: D5NS with 5/18 K/L at 65ml/hr, drain output 56ml GI Imaging: 4/18 CT abd - acute appendicitis 4/20 CT abd - gas and fluid collection at the operative site compatible with a developing abscess; Wall thickening is present in the terminal ileum with developing ileus 4/23 CT abd - multiple possible abscesses; partial small bowel obstruction versus paralytic ileus secondary to inflammation Surgeries / Procedures:  4/18 Appendectomy 4/23 s/p RLQ JP drain, 45 ml thick foul smelling purulent fluid  Central access: Double lumen PICC placed 4/24 TPN start date: 4/24 >>  Nutritional Goals (per RD recommendation on 4/23): kCal: 2400-2600, Protein: 115-135, Fluid: >=2L Goal TPN rate is 147mL/hr (provides 126 g of protein and 2577 kcals per day)  Current Nutrition:  Clear liquids  TPN   Plan:  Increase TPN to goal rate of 134ml/hr This TPN provides 126 g of protein, 365 g of dextrose, and 83 g of lipids for a total of 2577 kcals meeting 100% of patient needs Electrolytes in TPN: standard concentration; Cl:Ac 1:1 Add MVI, trace elements to TPN Continue Sensitive q4h SSI and adjust as needed Decrease IVMF (D5NS + 0m K/L) to 20 ml/hr at 18:00 KCl 10 meq IV x4 Monitor TPN labs, f/u am  labs Dietician recommendations appreciated   Thank you for involving pharmacy in this patient's care.  , PharmD, BCPS Clinical Pharmacist Clinical phone for 07/10/2019 until 3p is [phone] 07/10/2019 7:42 AM  **Pharmacist phone directory can be found on [email or web address] listed under [facility]**

## 2019-07-11 LAB — COMPREHENSIVE METABOLIC PANEL
ALT: 49 U/L — ABNORMAL HIGH (ref 0–44)
AST: 36 U/L (ref 15–41)
Albumin: 2.2 g/dL — ABNORMAL LOW (ref 3.5–5.0)
Alkaline Phosphatase: 72 U/L (ref 38–126)
Anion gap: 7 (ref 5–15)
BUN: 8 mg/dL (ref 6–20)
CO2: 24 mmol/L (ref 22–32)
Calcium: 8.3 mg/dL — ABNORMAL LOW (ref 8.9–10.3)
Chloride: 105 mmol/L (ref 98–111)
Creatinine, Ser: 0.7 mg/dL (ref 0.61–1.24)
GFR calc Af Amer: >60 mL/min (ref >60)
GFR calc non Af Amer: >60 mL/min (ref >60)
Glucose, Bld: 120 mg/dL — ABNORMAL HIGH (ref 70–99)
Potassium: 4 mmol/L (ref 3.5–5.1)
Sodium: 136 mmol/L (ref 135–145)
Total Bilirubin: 0.6 mg/dL (ref 0.3–1.2)
Total Protein: 6.6 g/dL (ref 6.5–8.1)

## 2019-07-11 LAB — CBC
HCT: 39.4 % (ref 39.0–52.0)
Hemoglobin: 13.2 g/dL (ref 13.0–17.0)
MCH: 30.5 pg (ref 26.0–34.0)
MCHC: 33.5 g/dL (ref 30.0–36.0)
MCV: 91 fL (ref 80.0–100.0)
Platelets: 275 10*3/uL (ref 150–400)
RBC: 4.33 MIL/uL (ref 4.22–5.81)
RDW: 12 % (ref 11.5–15.5)
WBC: 14.2 10*3/uL — ABNORMAL HIGH (ref 4.0–10.5)
nRBC: 0 % (ref 0.0–0.2)

## 2019-07-11 LAB — DIFFERENTIAL
Abs Immature Granulocytes: 0.55 10*3/uL — ABNORMAL HIGH (ref 0.00–0.07)
Basophils Absolute: 0.1 10*3/uL (ref 0.0–0.1)
Basophils Relative: 1 %
Eosinophils Absolute: 0.3 10*3/uL (ref 0.0–0.5)
Eosinophils Relative: 2 %
Immature Granulocytes: 4 %
Lymphocytes Relative: 14 %
Lymphs Abs: 2 10*3/uL (ref 0.7–4.0)
Monocytes Absolute: 0.8 10*3/uL (ref 0.1–1.0)
Monocytes Relative: 6 %
Neutro Abs: 10.5 10*3/uL — ABNORMAL HIGH (ref 1.7–7.7)
Neutrophils Relative %: 73 %

## 2019-07-11 LAB — MAGNESIUM: Magnesium: 2 mg/dL (ref 1.7–2.4)

## 2019-07-11 LAB — TRIGLYCERIDES: Triglycerides: 180 mg/dL — ABNORMAL HIGH (ref <150)

## 2019-07-11 LAB — GLUCOSE, CAPILLARY
Glucose-Capillary: 112 mg/dL — ABNORMAL HIGH (ref 70–99)
Glucose-Capillary: 114 mg/dL — ABNORMAL HIGH (ref 70–99)
Glucose-Capillary: 115 mg/dL — ABNORMAL HIGH (ref 70–99)
Glucose-Capillary: 126 mg/dL — ABNORMAL HIGH (ref 70–99)
Glucose-Capillary: 131 mg/dL — ABNORMAL HIGH (ref 70–99)

## 2019-07-11 LAB — PREALBUMIN: Prealbumin: 7.3 mg/dL — ABNORMAL LOW (ref 18–38)

## 2019-07-11 LAB — PHOSPHORUS: Phosphorus: 4.6 mg/dL (ref 2.5–4.6)

## 2019-07-11 MED ORDER — METOCLOPRAMIDE HCL 5 MG/ML IJ SOLN
10.0000 mg | Freq: Three times a day (TID) | INTRAMUSCULAR | Status: AC
  Administered 2019-07-11 – 2019-07-12 (×3): 10 mg via INTRAVENOUS
  Filled 2019-07-11 (×3): qty 2

## 2019-07-11 MED ORDER — TRAVASOL 10 % IV SOLN
INTRAVENOUS | Status: AC
  Filled 2019-07-11: qty 1260

## 2019-07-11 MED ORDER — INSULIN ASPART 100 UNIT/ML ~~LOC~~ SOLN
0.0000 [IU] | Freq: Four times a day (QID) | SUBCUTANEOUS | Status: DC
  Administered 2019-07-11 – 2019-07-15 (×10): 1 [IU] via SUBCUTANEOUS

## 2019-07-11 NOTE — Progress Notes (Signed)
PHARMACY - TOTAL PARENTERAL NUTRITION CONSULT NOTE   Indication: Prolonged ileus  Patient Measurements: Height: 5\' 10"  (177.8 cm) Weight: 99.8 kg (220 lb) IBW/kg (Calculated) : 73 TPN AdjBW (KG): 79.7 Body mass index is 31.57 kg/m.  Assessment:  Matthew Pace presented to the hospital after an appendectomy on 4/18 with fever and vomiting. Now with possible SBO vs paralytic ileus to start TPN for nutrition support.   Glucose / Insulin: CBGs <150, 1 units given since TPN started Electrolytes: K 4 (goal >/= 4 with ileus) - potassium added to IV fluids; Mag 2 (goal >/= 2 with ileus) Renal: WNL LFTs / TGs: LFTs down 57/58, TG 161>180 Prealbumin / albumin: Alb 3.4 > 2.1; prealbumin 6>7.3 Intake / Output; MIVF: D5NS with 5/18 K/L at 65ml/hr, drain output 60ml Only 1 urine occurrence documented GI Imaging: 4/18 CT abd - acute appendicitis 4/20 CT abd - gas and fluid collection at the operative site compatible with a developing abscess; Wall thickening is present in the terminal ileum with developing ileus 4/23 CT abd - multiple possible abscesses; partial small bowel obstruction versus paralytic ileus secondary to inflammation Surgeries / Procedures:  4/18 Appendectomy 4/23 s/p RLQ JP drain, 45 ml thick foul smelling purulent fluid  Central access: Double lumen PICC placed 4/24 TPN start date: 4/24 >>  Nutritional Goals (per RD recommendation on 4/23): kCal: 2400-2600, Protein: 115-135, Fluid: >=2L Goal TPN rate is 195mL/hr (provides 126 g of protein and 2577 kcals per day)  Current Nutrition:  Clear liquids  TPN   Plan:  Continue TPN at goal rate of 156ml/hr This TPN provides 126 g of protein, 365 g of dextrose, and 83 g of lipids for a total of 2577 kcals meeting 100% of patient needs Electrolytes in TPN: standard concentration; Cl:Ac 1:1 Add MVI, trace elements to TPN Adjust to Sensitive q6h SSI and adjust as needed Monitor TPN labs  0m, PharmD, BCPS, BCCCP Clinical  Pharmacist [phone]  Please check AMION for all [facility] numbers  07/11/2019 8:16 AM

## 2019-07-11 NOTE — Progress Notes (Signed)
Referring Physician(s): Janee Morn, B.  Supervising Physician: Simonne Come  Patient Status:  [facility] - In-pt  Chief Complaint: Abdominal pain, nausea/vomiting. History of perforated appendicitis.    Subjective: Patient awake lying in bed, family member at the bedside. Endorses nausea/vomiting with any PO intake (water). Patient pleasant and conversant.   Allergies: Patient has no known allergies.  Medications: Prior to Admission medications   Medication Sig Start Date End Date Taking? Authorizing Provider  acetaminophen (TYLENOL) 500 MG tablet Take 2 tablets (1,000 mg total) by mouth every 6 (six) hours. 07/04/19  Yes Simaan, Elizabeth S, PA-C  DEXILANT 60 MG capsule Take 1 capsule by mouth daily. 07/03/19  Yes [provider]  ondansetron (ZOFRAN) 4 MG tablet Take 4 mg by mouth every 6 (six) hours as needed. 07/04/19  Yes [provider]  oxyCODONE (OXY IR/ROXICODONE) 5 MG immediate release tablet Take 1 tablet (5 mg total) by mouth every 6 (six) hours as needed for severe pain (not relieved by tylenol or ibuprofen). 07/04/19  Yes Adam Phenix, PA-C     Vital Signs: BP 136/86 (BP Location: Left Arm)   Pulse 88   Temp 98.8 F (37.1 C) (Oral)   Resp 19   Ht 5\' 10"  (1.778 m)   Wt 220 lb (99.8 kg)   SpO2 98%   BMI 31.57 kg/m   Physical Exam: A&O x3, RLQ drain intact with scant purulent fluid in the bulb. Insertion site/dressing are clean and dry, no redness or swelling observed. Site is moderately tender. 30 cc out put in the last 12 hours.Patient states nursing staff are flushing drain three times daily.  Imaging: CT ABDOMEN PELVIS W CONTRAST  Result Date: 07/08/2019 CLINICAL DATA:  Abdominal abscess or infection suspected. Status post perforated appendix. EXAM: CT ABDOMEN AND PELVIS WITH CONTRAST TECHNIQUE: Multidetector CT imaging of the abdomen and pelvis was performed using the standard protocol following bolus administration of intravenous contrast.  CONTRAST:  51mL OMNIPAQUE IOHEXOL 300 MG/ML  SOLN COMPARISON:  07/05/2019 and 07/03/2019 FINDINGS: Lower chest: Dependent changes are identified within the lung bases posteriorly including increased ground-glass attenuation, pleural effusion and subsegmental atelectasis. Hepatobiliary: No focal liver abnormality is seen. No gallstones, gallbladder wall thickening, or biliary dilatation. Pancreas: Unremarkable. No pancreatic ductal dilatation or surrounding inflammatory changes. Spleen: Normal in size without focal abnormality. Adrenals/Urinary Tract: Normal appearance of the adrenal glands. 2 mm stone within inferior pole of left kidney noted. No hydronephrosis identified bilaterally. Urinary bladder is unremarkable. Stomach/Bowel: The stomach is nondistended. Increase caliber of the small bowel loops are identified which measure up to 3.5 cm. There is a transition to decreased caliber distal small bowel loops within the left lower quadrant. Postoperative changes are identified within the right lower quadrant of the abdomen. There is associated inflammation, free fluid and fat stranding. Vascular/Lymphatic: Normal appearance of the abdominal aorta. Prominent ileocolic lymph nodes are identified in the right lower quadrant of the abdomen, likely reactive, image 63/3. Reproductive: Prostate is unremarkable. Other: Early, immature fluid collection, in the right lower quadrant of the abdomen measures 5.9 by 3.4 by 5.3 cm. A separate fluid collection is identified between the anterior wall of rectum and bladder measuring 4.5 x 2.9 by 2.9 cm, image 82/3. Lastly, inflammation, phlegmon and possible small linear fluid collection is identified tracking along the right pericolic gutter, image 56/6. Musculoskeletal: No acute or significant osseous findings. IMPRESSION: 1. Early, immature fluid collection, in the right lower quadrant of the abdomen is identified, which may  reflect early abscess. A separate fluid collection is  identified between the anterior wall of rectum and bladder and may represent an additional abscess. Inflammation, phlegmon and possible early fluid collection is also noted tracking along the right pericolic gutter towards the inferior margin of the liver. 2. Increase caliber of the small bowel loops with transition to decreased caliber distal small bowel loops within the left lower quadrant. Findings concerning for partial small bowel obstruction versus paralytic ileus secondary to inflammation. 3. Nonobstructing left renal calculus. 4. Dependent changes are identified within the lung bases posteriorly including increased ground-glass attenuation, ground-glass attenuation, pleural effusion and subsegmental atelectasis. Electronically Signed   By: Signa Kell M.D.   On: 07/08/2019 11:26   CT IMAGE GUIDED DRAINAGE BY PERCUTANEOUS CATHETER  Result Date: 07/08/2019 INDICATION: 29 year old male with a history of perforated appendicitis status post laparoscopic appendectomy. He now has an intra-abdominal fluid and gas collection consistent with postoperative abscess. He presents for placement of a CT-guided drain EXAM: CT-guided drain placement MEDICATIONS: The patient is currently admitted to the hospital and receiving intravenous antibiotics. The antibiotics were administered within an appropriate time frame prior to the initiation of the procedure. ANESTHESIA/SEDATION: Fentanyl 3.5 mcg IV; Versed 175 mg IV Moderate Sedation Time:  17 minutes The patient was continuously monitored during the procedure by the interventional radiology nurse under my direct supervision. COMPLICATIONS: None immediate. PROCEDURE: Informed written consent was obtained from the patient after a thorough discussion of the procedural risks, benefits and alternatives. All questions were addressed. A timeout was performed prior to the initiation of the procedure. A planning axial CT scan was performed. The fluid and gas collection was  successfully identified. A suitable skin entry site was selected and marked. The overlying skin was sterilely prepped and draped in the standard fashion using chlorhexidine skin prep. Local anesthesia was attained by infiltration with 1% lidocaine. A small dermatotomy was made. Under intermittent CT guidance, an 18 gauge trocar needle was carefully advanced through the anterior abdominal wall and into the fluid collection. An Amplatz wire was then coiled in the fluid collection. The skin tract was dilated to 10 [place]. A Cook 10.2 [place] all-purpose drainage catheter was then advanced over the wire and formed in the fluid and gas collection. Aspiration yields approximately 45 mL of foul-smelling purulent fluid. A sample of the fluid was sent for Gram stain and culture. The catheter was gently flushed and connected to JP bulb suction. The catheter was secured to the skin with 0 Prolene suture and sterile bandages were applied. Follow-up CT imaging demonstrates a well-positioned drainage catheter. IMPRESSION: Successful placement of 10 French drainage catheter into the postoperative abscess. Aspiration yields 45 mL of thick, foul-smelling purulent fluid. Signed, Sterling Big, MD, RPVI [facility] Electronically Signed   By: Malachy Moan M.D.   On: 07/08/2019 19:26   [place] EKG SITE RITE  Result Date: 07/08/2019 If Site Rite image not attached, placement could not be confirmed due to current cardiac rhythm.   Labs:  CBC: Recent Labs    07/08/19 0519 07/09/19 0237 07/10/19 1120 07/11/19 0359  WBC 14.6* 12.9* 13.8* 14.2*  HGB 13.2 13.0 12.5* 13.2  HCT 38.4* 38.3* 37.0* 39.4  PLT 239 241 228 275    COAGS: Recent Labs    07/03/19 1126 07/04/19 2229  INR 1.1 1.4*  APTT 28  --     BMP: Recent Labs    07/08/19 0519 07/09/19 0237 07/10/19 0410 07/11/19 0359  NA 140 141 138 136  K 3.6 3.5 3.6 4.0  CL 106 110 109 105  CO2  22 22 22 24   GLUCOSE 103* 149* 126* 120*  BUN 18 12 8 8   CALCIUM 8.6* 8.1* 7.9* 8.3*  CREATININE 0.98 0.85 0.73 0.70  GFRNONAA >60 >60 >60 >60  GFRAA >60 >60 >60 >60    LIVER FUNCTION TESTS: Recent Labs    07/04/19 2229 07/09/19 0237 07/10/19 0410 07/11/19 0359  BILITOT 1.4* 0.4 0.7 0.6  AST 24 131* 57* 36  ALT 16 80* 58* 49*  ALKPHOS 66 66 60 72  PROT 7.4 6.0* 5.6* 6.6  ALBUMIN 3.4* 2.1* 1.9* 2.2*    Assessment and Plan:  Patient with history of perforated appendicitis, status post laparoscopic appendectomy and development of post-op RLQ abscess. Drain placed 07/08/2019 by Dr. Laurence Ferrari. Patient is afebrile, labs are stable. Patient is on antibiotics and TPN per primary team. Plan: continue drain irrigation and out put monitoring. Once output is minimal (10-15 cc/day for 2-3 consecutive days) or if clinical status worsens, obtain follow up CT scan. Patient will likely need drain injection before removal.   Electronically Signed: Theresa Duty, NP 07/11/2019, 3:27 PM   I spent a total of 15 Minutes at the the patient's bedside AND on the patient's hospital floor or unit, greater than 50% of which was counseling/coordinating care for RLQ abscess drain.

## 2019-07-11 NOTE — Progress Notes (Signed)
One staple came of patient incision and gone into the JP drain. Patient called RN to show it to her. Will continue to monitor patient.

## 2019-07-11 NOTE — Progress Notes (Addendum)
CC: Abdominal pain  Subjective: Patient still feels terrible.  He tried drinking something yesterday and became nauseated.  When he is nauseated he cannot wear his glasses to read, watch TV, or do anything to entertain himself.  His girlfriend and mother are still not convinced there is not something more we can do.  On exam he is sore still having some discomfort.  But overall the exam is unchanged from last week.  The JP drains been emptied and there is a little bit of fluid in the bulb.  This is still a light brownish colored purulent fluid.  The fluid in the JP tubing is more clear.  The JP drain does not have an odor that I can detect.  Objective: Vital signs in last 24 hours: Temp:  [98.4 F (36.9 C)-100 F (37.8 C)] 98.7 F (37.1 C) (04/26 0339) Pulse Rate:  [87-90] 87 (04/26 0339) Resp:  [17] 17 (04/26 0339) BP: (125-140)/(76-87) 125/76 (04/26 0339) SpO2:  [97 %-98 %] 98 % (04/26 0339) Last BM Date: 07/09/19 Nothing p.o. recorded 1404 IV recorded Urine x1 recorded Drain 30 T-max 100, vital signs are stable CMP is stable Prealbumin 7.3 WBC 14.2 H/H 13.2/39   Intake/Output from previous day: 04/25 0701 - 04/26 0700 In: 1404.9 [I.V.:707.7; IV Piggyback:687.2] Out: 30 [Drains:30] Intake/Output this shift: No intake/output data recorded.  General appearance: alert, cooperative, no distress and Very depressed. Resp: clear to auscultation bilaterally GI: Soft but still sore and tender.  No distention.  No bowel sounds he still having some output from his rectum but he says he would need to call it diarrhea.  JP drain seems to be clearing up.  Lab Results:  Recent Labs    07/10/19 1120 07/11/19 0359  WBC 13.8* 14.2*  HGB 12.5* 13.2  HCT 37.0* 39.4  PLT 228 275    BMET Recent Labs    07/10/19 0410 07/11/19 0359  NA 138 136  K 3.6 4.0  CL 109 105  CO2 22 24  GLUCOSE 126* 120*  BUN 8 8  CREATININE 0.73 0.70  CALCIUM 7.9* 8.3*   PT/INR No results  for input(s): LABPROT, INR in the last 72 hours.  Recent Labs  Lab 07/04/19 2229 07/09/19 0237 07/10/19 0410 07/11/19 0359  AST 24 131* 57* 36  ALT 16 80* 58* 49*  ALKPHOS 66 66 60 72  BILITOT 1.4* 0.4 0.7 0.6  PROT 7.4 6.0* 5.6* 6.6  ALBUMIN 3.4* 2.1* 1.9* 2.2*     Lipase  No results found for: LIPASE   Medications: . Chlorhexidine Gluconate Cloth  6 each Topical Daily  . docusate sodium  100 mg Oral BID  . enoxaparin (LOVENOX) injection  40 mg Subcutaneous Daily  . insulin aspart  0-9 Units Subcutaneous Q4H  . pantoprazole (PROTONIX) IV  40 mg Intravenous Q12H  . saccharomyces boulardii  250 mg Oral BID  . sodium chloride flush  5 mL Intracatheter Q8H   Organism ID, Bacteria ESCHERICHIA COLI   Resulting Agency [facility]  Susceptibility   Escherichia coli    MIC    AMPICILLIN >=32 RESIST... Resistant    AMPICILLIN/SULBACTAM >=32 RESIST... Resistant    CEFAZOLIN <=4 SENSITIVE  Sensitive    CEFEPIME <=1 SENSITIVE  Sensitive    CEFTAZIDIME <=1 SENSITIVE  Sensitive    CEFTRIAXONE <=1 SENSITIVE  Sensitive    CIPROFLOXACIN INTERMEDIATE  Intermediate    GENTAMICIN <=1 SENSITIVE  Sensitive    IMIPENEM <=0.25 SENS... Sensitive  PIP/TAZO <=4 SENSITIVE  Sensitive    TRIMETH/SULFA <=20 SENSIT... Sensitive         Susceptibility Comments  Escherichia coli  ABUNDANT ESCHERICHIA COLI        Assessment/Plan Hx GERD/nausea/vomiting at home Severe malnutrition - Prealbumin 6.0 >>7.3   s/p Laparoscopic Appendectomy for perforated appendicitis (contained) - Dr. Marcello Moores 4/18- 07/04/19 Readmit 07/05/19 Post-Op Intra-abdominal fluid collection Reactive ileus - Fluid collection does not appear amenable to IR drainage - Continue IV abx. Vanc added overnight - WBC 17.9(4/19)>>15.0(4/21)>>12.7(4/22)>>14.6(4/23)>> 14.2(4/26) - Repeat CT shows increased fluid collection  - IR drain 4/23 - 45 cc foul smelling purulent fluid -Cont IVF >> adding PICC/TPN - AM labs -  Mobilize, IS  FEN -Clears/TPN VTE -SCDs, Lovenox ID -Zosyn/Vanc4/20 >>  Plan: Continue IV Zosyn.  I am going to discontinue the vancomycin.  Continue TPN.  If he does not show some improvement will probably repeat CT 4/28.       LOS: 6 days    [name] 07/11/2019 Please see Amion

## 2019-07-12 LAB — CBC
HCT: 41.1 % (ref 39.0–52.0)
Hemoglobin: 13.8 g/dL (ref 13.0–17.0)
MCH: 30.3 pg (ref 26.0–34.0)
MCHC: 33.6 g/dL (ref 30.0–36.0)
MCV: 90.3 fL (ref 80.0–100.0)
Platelets: 281 10*3/uL (ref 150–400)
RBC: 4.55 MIL/uL (ref 4.22–5.81)
RDW: 12.1 % (ref 11.5–15.5)
WBC: 11.9 10*3/uL — ABNORMAL HIGH (ref 4.0–10.5)
nRBC: 0 % (ref 0.0–0.2)

## 2019-07-12 LAB — GLUCOSE, CAPILLARY
Glucose-Capillary: 114 mg/dL — ABNORMAL HIGH (ref 70–99)
Glucose-Capillary: 126 mg/dL — ABNORMAL HIGH (ref 70–99)
Glucose-Capillary: 133 mg/dL — ABNORMAL HIGH (ref 70–99)
Glucose-Capillary: 143 mg/dL — ABNORMAL HIGH (ref 70–99)

## 2019-07-12 MED ORDER — METOCLOPRAMIDE HCL 5 MG/ML IJ SOLN
10.0000 mg | Freq: Three times a day (TID) | INTRAMUSCULAR | Status: AC
  Administered 2019-07-12 – 2019-07-13 (×3): 10 mg via INTRAVENOUS
  Filled 2019-07-12 (×3): qty 2

## 2019-07-12 MED ORDER — TRAVASOL 10 % IV SOLN
INTRAVENOUS | Status: AC
  Filled 2019-07-12: qty 1260

## 2019-07-12 NOTE — Plan of Care (Signed)
  Problem: Education: Goal: Knowledge of General Education information will improve Description: Including pain rating scale, medication(s)/side effects and non-pharmacologic comfort measures Outcome: Progressing   Problem: Health Behavior/Discharge Planning: Goal: Ability to manage health-related needs will improve Outcome: Progressing   Problem: Clinical Measurements: Goal: Ability to maintain clinical measurements within normal limits will improve Outcome: Progressing Goal: Respiratory complications will improve Outcome: Progressing Goal: Cardiovascular complication will be avoided Outcome: Progressing   Problem: Activity: Goal: Risk for activity intolerance will decrease Outcome: Progressing   Problem: Pain Managment: Goal: General experience of comfort will improve Outcome: Progressing   Problem: Safety: Goal: Ability to remain free from injury will improve Outcome: Progressing   Problem: Skin Integrity: Goal: Risk for impaired skin integrity will decrease Outcome: Progressing   

## 2019-07-12 NOTE — Plan of Care (Signed)

## 2019-07-12 NOTE — Progress Notes (Signed)
PHARMACY - TOTAL PARENTERAL NUTRITION CONSULT NOTE   Indication: Prolonged ileus  Patient Measurements: Height: 5\' 10"  (177.8 cm) Weight: 99.8 kg (220 lb) IBW/kg (Calculated) : 73 TPN AdjBW (KG): 79.7 Body mass index is 31.57 kg/m.  Assessment:  11 yom presented to the hospital after an appendectomy on 4/18 with fever and vomiting. Now with possible SBO vs paralytic ileus to start TPN for nutrition support.   Glucose / Insulin: CBGs <150, 3 units given since TPN started Electrolytes: K 4 (goal >/= 4 with ileus) - potassium added to IV fluids; Mag 2 (goal >/= 2 with ileus) Renal: WNL LFTs / TGs: LFTs down 57/58, TG 161>180 Prealbumin / albumin: Alb 3.4 > 2.1; prealbumin 6>7.3 Intake / Output; MIVF: D5NS with 5/18 K/L at 4ml/hr, drain output 15 mL - no urine charted GI Imaging: 4/18 CT abd - acute appendicitis 4/20 CT abd - gas and fluid collection at the operative site compatible with a developing abscess; Wall thickening is present in the terminal ileum with developing ileus 4/23 CT abd - multiple possible abscesses; partial small bowel obstruction versus paralytic ileus secondary to inflammation  Pending repeat ct abd wed   Surgeries / Procedures:  4/18 Appendectomy 4/23 s/p RLQ JP drain, 45 ml thick foul smelling purulent fluid  Central access: Double lumen PICC placed 4/24 TPN start date: 4/24 >>  Nutritional Goals (per RD recommendation on 4/23): kCal: 2400-2600, Protein: 115-135, Fluid: >=2L Goal TPN rate is 121mL/hr (provides 126 g of protein and 2577 kcals per day)  Current Nutrition:  Clear liquids  TPN   Plan:  Continue TPN at goal rate of 17ml/hr This TPN provides 126 g of protein, 365 g of dextrose, and 83 g of lipids for a total of 2577 kcals meeting 100% of patient needs Electrolytes in TPN: standard concentration; Cl:Ac 1:1 Add MVI, trace elements to TPN continue Sensitive q6h SSI and adjust as needed Monitor TPN labs  0m, PharmD, BCPS,  BCCCP Clinical Pharmacist [phone]  Please check AMION for all [facility] Pharm[facility] numbers  07/12/2019 7:53 AM

## 2019-07-12 NOTE — Progress Notes (Signed)
    CC: Abdominal pain  Subjective: He has less drainage from the JP.  Down to 15 cc.  It is fairly clear in the tubing but he still has some cloudy fluid in the bulb of the JP drain.  He found a staple in the drainage from the JP bulb.  I found it also.  His nausea remains about the same.  He is not having diarrhea but he says when he sits on the toilet he just kind of falls out.  Objective: Vital signs in last 24 hours: Temp:  [98.8 F (37.1 C)-99.5 F (37.5 C)] 98.8 F (37.1 C) (04/27 0520) Pulse Rate:  [88-94] 94 (04/27 0520) Resp:  [17-19] 18 (04/27 0520) BP: (129-136)/(79-86) 129/85 (04/27 0520) SpO2:  [97 %-98 %] 97 % (04/27 0520) Last BM Date: 07/10/19 4485 IV Drain 15 WBC 11.9  Intake/Output from previous day: 04/26 0701 - 04/27 0700 In: 4485.4 [I.V.:4245.4; IV Piggyback:240.1] Out: 15 [Drains:15] Intake/Output this shift: No intake/output data recorded.  General appearance: alert, cooperative and no distress Resp: clear to auscultation bilaterally GI: Soft, still uncomfortable in the right side.  Drainage is clear in the tube.  Volume of drainage is less.  There is no abdominal distention or peritonitis.  Continues to have very loose stools.  Lab Results:  Recent Labs    07/11/19 0359 07/12/19 0458  WBC 14.2* 11.9*  HGB 13.2 13.8  HCT 39.4 41.1  PLT 275 281    BMET Recent Labs    07/10/19 0410 07/11/19 0359  NA 138 136  K 3.6 4.0  CL 109 105  CO2 22 24  GLUCOSE 126* 120*  BUN 8 8  CREATININE 0.73 0.70  CALCIUM 7.9* 8.3*   PT/INR No results for input(s): LABPROT, INR in the last 72 hours.  Recent Labs  Lab 07/09/19 0237 07/10/19 0410 07/11/19 0359  AST 131* 57* 36  ALT 80* 58* 49*  ALKPHOS 66 60 72  BILITOT 0.4 0.7 0.6  PROT 6.0* 5.6* 6.6  ALBUMIN 2.1* 1.9* 2.2*     Lipase  No results found for: LIPASE   Medications: . Chlorhexidine Gluconate Cloth  6 each Topical Daily  . docusate sodium  100 mg Oral BID  . enoxaparin  (LOVENOX) injection  40 mg Subcutaneous Daily  . insulin aspart  0-9 Units Subcutaneous Q6H  . pantoprazole (PROTONIX) IV  40 mg Intravenous Q12H  . saccharomyces boulardii  250 mg Oral BID  . sodium chloride flush  5 mL Intracatheter Q8H    Assessment/Plan Hx GERD/nausea/vomiting at home Severe malnutrition - Prealbumin 6.0 >>7.3   s/p Laparoscopic Appendectomy for perforated appendicitis (contained) - Dr. Maisie Fus 4/18- 07/04/19 Readmit 07/05/19 Post-Op Intra-abdominal fluid collection Reactive ileus - Fluid collection does not appear amenable to IR drainage - Continue IV abx. Vanc added overnight - WBC 17.9(4/19)>>15.0(4/21)>>12.7(4/22)>>14.6(4/23)>>     14.2(4/26)>>11.9(4/27) -Repeat CT shows increased fluid collection - IR drain 4/23 - 45 cc foul smelling purulent fluid -Cont IVF>> adding PICC/TPN - AM labs - Mobilize, IS  FEN -Clears/TPN VTE -SCDs, Lovenox ID -Vanc 2/20-4/26;  Zosyn4/20 >>   Plan:  Continue current Zosyn, discuss changing abx with Dr. Magnus Ivan, CT tomorrow.         LOS: 7 days    Shakeita Vandevander 07/12/2019 Please see Amion

## 2019-07-12 NOTE — Progress Notes (Signed)
Referring Physician(s): Janee Morn, B.  Supervising Physician:  Dr. Fredia Sorrow  Patient Status:  [facility] [address] - In-pt  Chief Complaint: Abdominal pain, nausea/vomiting. History of perforated appendicitis.    Subjective: Patient awake lying in bed, family member at the bedside. Endorses nausea/vomiting with any PO intake (water). Patient pleasant and conversant.   Allergies: Patient has no known allergies.  Medications:  Current Facility-Administered Medications:  .  Chlorhexidine Gluconate Cloth 2 % PADS 6 each, 6 each, Topical, Daily, Violeta Gelinas, MD, 6 each at 07/12/19 1006 .  dextrose 5 % and 0.9 % NaCl with KCl 40 mEq/L infusion, , Intravenous, Continuous, Ilda Basset, [place], Last Rate: 20 mL/hr at 07/12/19 0400, Rate Verify at 07/12/19 0400 .  diphenhydrAMINE (BENADRYL) capsule 25 mg, 25 mg, Oral, Q6H PRN **OR** diphenhydrAMINE (BENADRYL) injection 25 mg, 25 mg, Intravenous, Q6H PRN, Maczis, Elmer Sow, PA-C .  docusate sodium (COLACE) capsule 100 mg, 100 mg, Oral, BID, Jacinto Halim, PA-C, 100 mg at 07/05/19 0943 .  enoxaparin (LOVENOX) injection 40 mg, 40 mg, Subcutaneous, Daily, Jacinto Halim, PA-C, 40 mg at 07/12/19 1005 .  insulin aspart (novoLOG) injection 0-9 Units, 0-9 Units, Subcutaneous, Q6H, Emelia Loron, MD, 1 Units at 07/12/19 0535 .  metoprolol tartrate (LOPRESSOR) injection 5 mg, 5 mg, Intravenous, Q6H PRN, Jacinto Halim, PA-C, 5 mg at 07/05/19 1323 .  metoprolol tartrate (LOPRESSOR) injection 5 mg, 5 mg, Intravenous, Q6H PRN, Emelia Loron, MD, 5 mg at 07/06/19 0936 .  morphine 2 MG/ML injection 2-3 mg, 2-3 mg, Intravenous, Q2H PRN, Sherrie George, PA-C, 2 mg at 07/11/19 1411 .  ondansetron (ZOFRAN-ODT) disintegrating tablet 4 mg, 4 mg, Oral, Q6H PRN **OR** ondansetron (ZOFRAN) injection 4 mg, 4 mg, Intravenous, Q6H PRN, Jacinto Halim, PA-C, 4 mg at 07/10/19 0446 .  oxyCODONE (Oxy IR/ROXICODONE) immediate release tablet 5-10 mg, 5-10  mg, Oral, Q4H PRN, Maczis, Elmer Sow, PA-C .  pantoprazole (PROTONIX) injection 40 mg, 40 mg, Intravenous, Q12H, Sherrie George, PA-C, 40 mg at 07/12/19 1005 .  piperacillin-tazobactam (ZOSYN) IVPB 3.375 g, 3.375 g, Intravenous, Q8H, Maczis, Elmer Sow, PA-C, Last Rate: 12.5 mL/hr at 07/12/19 0806, 3.375 g at 07/12/19 0806 .  polyethylene glycol (MIRALAX / GLYCOLAX) packet 17 g, 17 g, Oral, Daily PRN, Maczis, Elmer Sow, PA-C .  promethazine (PHENERGAN) injection 6.25 mg, 6.25 mg, Intravenous, Q6H PRN, Sherrie George, PA-C, 6.25 mg at 07/09/19 0026 .  saccharomyces boulardii (FLORASTOR) capsule 250 mg, 250 mg, Oral, BID, Sherrie George, PA-C .  simethicone [facility]) chewable tablet 40 mg, 40 mg, Oral, Q6H PRN, Maczis, Elmer Sow, PA-C .  sodium chloride flush (NS) 0.9 % injection 10-40 mL, 10-40 mL, Intracatheter, PRN, Violeta Gelinas, MD .  sodium chloride flush (NS) 0.9 % injection 5 mL, 5 mL, Intracatheter, Q8H, Malachy Moan, MD, 5 mL at 07/12/19 0535 .  TPN ADULT (ION), , Intravenous, Continuous TPN, Emelia Loron, MD, Last Rate: 105 mL/hr at 07/12/19 0400, Rate Verify at 07/12/19 0400 .  TPN ADULT (ION), , Intravenous, Continuous TPN, Abigail Miyamoto, MD    Vital Signs: BP 129/85 (BP Location: Left Arm)   Pulse 94   Temp 98.8 F (37.1 C) (Oral)   Resp 18   Ht 5\' 10"  (1.778 m)   Wt 99.8 kg   SpO2 97%   BMI 31.57 kg/m   Physical Exam: A&O x3,  RLQ drain intact, site clean, minimal tender Output still cloudy in bulb, a little more clear in line with some purulent debris  Imaging: CT ABDOMEN PELVIS W CONTRAST  Result Date: 07/08/2019 CLINICAL DATA:  Abdominal abscess or infection suspected. Status post perforated appendix. EXAM: CT ABDOMEN AND PELVIS WITH CONTRAST TECHNIQUE: Multidetector CT imaging of the abdomen and pelvis was performed using the standard protocol following bolus administration of intravenous contrast. CONTRAST:  87mL OMNIPAQUE IOHEXOL 300 MG/ML   SOLN COMPARISON:  07/05/2019 and 07/03/2019 FINDINGS: Lower chest: Dependent changes are identified within the lung bases posteriorly including increased ground-glass attenuation, pleural effusion and subsegmental atelectasis. Hepatobiliary: No focal liver abnormality is seen. No gallstones, gallbladder wall thickening, or biliary dilatation. Pancreas: Unremarkable. No pancreatic ductal dilatation or surrounding inflammatory changes. Spleen: Normal in size without focal abnormality. Adrenals/Urinary Tract: Normal appearance of the adrenal glands. 2 mm stone within inferior pole of left kidney noted. No hydronephrosis identified bilaterally. Urinary bladder is unremarkable. Stomach/Bowel: The stomach is nondistended. Increase caliber of the small bowel loops are identified which measure up to 3.5 cm. There is a transition to decreased caliber distal small bowel loops within the left lower quadrant. Postoperative changes are identified within the right lower quadrant of the abdomen. There is associated inflammation, free fluid and fat stranding. Vascular/Lymphatic: Normal appearance of the abdominal aorta. Prominent ileocolic lymph nodes are identified in the right lower quadrant of the abdomen, likely reactive, image 63/3. Reproductive: Prostate is unremarkable. Other: Early, immature fluid collection, in the right lower quadrant of the abdomen measures 5.9 by 3.4 by 5.3 cm. A separate fluid collection is identified between the anterior wall of rectum and bladder measuring 4.5 x 2.9 by 2.9 cm, image 82/3. Lastly, inflammation, phlegmon and possible small linear fluid collection is identified tracking along the right pericolic gutter, image 56/6. Musculoskeletal: No acute or significant osseous findings. IMPRESSION: 1. Early, immature fluid collection, in the right lower quadrant of the abdomen is identified, which may reflect early abscess. A separate fluid collection is identified between the anterior wall of rectum  and bladder and may represent an additional abscess. Inflammation, phlegmon and possible early fluid collection is also noted tracking along the right pericolic gutter towards the inferior margin of the liver. 2. Increase caliber of the small bowel loops with transition to decreased caliber distal small bowel loops within the left lower quadrant. Findings concerning for partial small bowel obstruction versus paralytic ileus secondary to inflammation. 3. Nonobstructing left renal calculus. 4. Dependent changes are identified within the lung bases posteriorly including increased ground-glass attenuation, ground-glass attenuation, pleural effusion and subsegmental atelectasis. Electronically Signed   By: Signa Kell M.D.   On: 07/08/2019 11:26   CT IMAGE GUIDED DRAINAGE BY PERCUTANEOUS CATHETER  Result Date: 07/08/2019 INDICATION: 29 year old male with a history of perforated appendicitis status post laparoscopic appendectomy. He now has an intra-abdominal fluid and gas collection consistent with postoperative abscess. He presents for placement of a CT-guided drain EXAM: CT-guided drain placement MEDICATIONS: The patient is currently admitted to the hospital and receiving intravenous antibiotics. The antibiotics were administered within an appropriate time frame prior to the initiation of the procedure. ANESTHESIA/SEDATION: Fentanyl 3.5 mcg IV; Versed 175 mg IV Moderate Sedation Time:  17 minutes The patient was continuously monitored during the procedure by the interventional radiology nurse under my direct supervision. COMPLICATIONS: None immediate. PROCEDURE: Informed written consent was obtained from the patient after a thorough discussion of the procedural risks, benefits and alternatives. All questions were addressed. A timeout was performed prior to the initiation of the procedure. A planning axial CT scan was performed. The fluid and gas  collection was successfully identified. A suitable skin entry site  was selected and marked. The overlying skin was sterilely prepped and draped in the standard fashion using chlorhexidine skin prep. Local anesthesia was attained by infiltration with 1% lidocaine. A small dermatotomy was made. Under intermittent CT guidance, an 18 gauge trocar needle was carefully advanced through the anterior abdominal wall and into the fluid collection. An Amplatz wire was then coiled in the fluid collection. The skin tract was dilated to 10 [place]. A Cook 10.2 [place] all-purpose drainage catheter was then advanced over the wire and formed in the fluid and gas collection. Aspiration yields approximately 45 mL of foul-smelling purulent fluid. A sample of the fluid was sent for Gram stain and culture. The catheter was gently flushed and connected to JP bulb suction. The catheter was secured to the skin with 0 Prolene suture and sterile bandages were applied. Follow-up CT imaging demonstrates a well-positioned drainage catheter. IMPRESSION: Successful placement of 10 French drainage catheter into the postoperative abscess. Aspiration yields 45 mL of thick, foul-smelling purulent fluid. Signed, Criselda Peaches, MD, [facility] Electronically Signed   By: Jacqulynn Cadet M.D.   On: 07/08/2019 19:26   [place] EKG SITE RITE  Result Date: 07/08/2019 If Site Rite image not attached, placement could not be confirmed due to current cardiac rhythm.   Labs:  CBC: Recent Labs    07/09/19 0237 07/10/19 1120 07/11/19 0359 07/12/19 0458  WBC 12.9* 13.8* 14.2* 11.9*  HGB 13.0 12.5* 13.2 13.8  HCT 38.3* 37.0* 39.4 41.1  PLT 241 228 275 281    COAGS: Recent Labs    07/03/19 1126 07/04/19 2229  INR 1.1 1.4*  APTT 28  --     BMP: Recent Labs    07/08/19 0519 07/09/19 0237 07/10/19 0410 07/11/19 0359  NA 140 141 138 136  K 3.6 3.5 3.6 4.0  CL 106 110 109 105  CO2 22 22 22 24   GLUCOSE 103* 149* 126* 120*  BUN 18 12  8 8   CALCIUM 8.6* 8.1* 7.9* 8.3*  CREATININE 0.98 0.85 0.73 0.70  GFRNONAA >60 >60 >60 >60  GFRAA >60 >60 >60 >60    LIVER FUNCTION TESTS: Recent Labs    07/04/19 2229 07/09/19 0237 07/10/19 0410 07/11/19 0359  BILITOT 1.4* 0.4 0.7 0.6  AST 24 131* 57* 36  ALT 16 80* 58* 49*  ALKPHOS 66 66 60 72  PROT 7.4 6.0* 5.6* 6.6  ALBUMIN 3.4* 2.1* 1.9* 2.2*    Assessment and Plan:  Patient with history of perforated appendicitis, status post laparoscopic appendectomy and development of post-op RLQ abscess. Drain placed 07/08/2019 by Dr. Laurence Ferrari. WBC improving On TPN, still has ileus  Plan: continue drain irrigation and out put monitoring.  CT scan planned for 4/28 per [facility]  Electronically Signed: Ascencion Dike, PA-C 07/12/2019, 10:21 AM   I spent a total of 15 Minutes at the the patient's bedside AND on the patient's hospital floor or unit, greater than 50% of which was counseling/coordinating care for RLQ abscess drain.

## 2019-07-13 ENCOUNTER — Inpatient Hospital Stay (HOSPITAL_COMMUNITY): Payer: BC Managed Care – PPO

## 2019-07-13 LAB — GLUCOSE, CAPILLARY
Glucose-Capillary: 121 mg/dL — ABNORMAL HIGH (ref 70–99)
Glucose-Capillary: 123 mg/dL — ABNORMAL HIGH (ref 70–99)
Glucose-Capillary: 127 mg/dL — ABNORMAL HIGH (ref 70–99)
Glucose-Capillary: 134 mg/dL — ABNORMAL HIGH (ref 70–99)
Glucose-Capillary: 138 mg/dL — ABNORMAL HIGH (ref 70–99)

## 2019-07-13 LAB — CBC
HCT: 42.9 % (ref 39.0–52.0)
Hemoglobin: 14.5 g/dL (ref 13.0–17.0)
MCH: 30.1 pg (ref 26.0–34.0)
MCHC: 33.8 g/dL (ref 30.0–36.0)
MCV: 89.2 fL (ref 80.0–100.0)
Platelets: 320 10*3/uL (ref 150–400)
RBC: 4.81 MIL/uL (ref 4.22–5.81)
RDW: 11.9 % (ref 11.5–15.5)
WBC: 13.1 10*3/uL — ABNORMAL HIGH (ref 4.0–10.5)
nRBC: 0 % (ref 0.0–0.2)

## 2019-07-13 MED ORDER — TRAVASOL 10 % IV SOLN
INTRAVENOUS | Status: AC
  Filled 2019-07-13: qty 1260

## 2019-07-13 MED ORDER — ACETAMINOPHEN 500 MG PO TABS: 1000.0000 mg | ORAL_TABLET | Freq: Four times a day (QID) | ORAL | Status: DC | PRN

## 2019-07-13 MED ORDER — IOHEXOL 350 MG/ML SOLN
100.0000 mL | Freq: Once | INTRAVENOUS | Status: AC | PRN
  Administered 2019-07-13: 100 mL via INTRAVENOUS

## 2019-07-13 NOTE — Progress Notes (Signed)
Nutrition Follow-up  DOCUMENTATION CODES:   Obesity unspecified  INTERVENTION:   -TPN management per pharmacy - Continue clear liquid diet; RD will follow for diet advancement and supplement as appropriate  NUTRITION DIAGNOSIS:   Inadequate oral intake related to altered GI function as evidenced by per patient/family report, other (comment)(clear liquid diet order).  Ongoing  GOAL:   Patient will meet greater than or equal to 90% of their needs  Met with TPN  MONITOR:   PO intake, Diet advancement, Labs, Weight trends  REASON FOR ASSESSMENT:   Consult New TPN/TNA  ASSESSMENT:   29 year old male who presented on 4/19 with vomiting and fever. PMH of GERD and perforated appendicitis s/p laparoscopic appendectomy on 07/03/19. Pt found to have post-op intra-abdominal fluid collection, reactive ileus.  4/21 - advanced to regular diet 4/22 - downgraded to full liquids, downgraded to clears 4/23- TPN initiated, s/p drain placement for intra-abdominal abscess 4/24- PICC placed, TPN initiated  Reviewed I/O's: +3.1 L x 24 hours and +22.7 L since admission  Drain output: 20 ml x 24 hours  Pt in with MD at time of visit.   Pt has had minimal intake of clear liquid diet secondary to abdominal pain and nausea. Documented meal completion 0%. Per MD notes, this has improved today.   Pt remains on TPN at goal rate of 105 ml/hr, which provides 2577 kcals and 126 grams protein meeting 100% of needs.   Per MD notes, CT scan today revealed resolution of RLQ abscess, decreased sizes of pelvic abscess, and retrocecal fluid collection. Plan to consult IR to see if fluid collection is amenable to drainage.   Labs reviewed: CBGS: 121-138(inpatient orders for glycemic control are 0-9 units insulin aspart every 6 hours).   Diet Order:   Diet Order            Diet clear liquid Room service appropriate? Yes; Fluid consistency: Thin  Diet effective now              EDUCATION NEEDS:    Education needs have been addressed  Skin:  Skin Assessment: Reviewed RN Assessment  Last BM:  07/13/19  Height:   Ht Readings from Last 1 Encounters:  07/05/19 '5\' 10"'  (1.778 m)    Weight:   Wt Readings from Last 1 Encounters:  07/05/19 99.8 kg    Ideal Body Weight:  75.5 kg  BMI:  Body mass index is 31.57 kg/m.  Estimated Nutritional Needs:   Kcal:  2400-2600  Protein:  115-135 grams  Fluid:  >/= 2.0 L    Loistine Chance, RD, LDN, [place] Registered Dietitian II Certified Diabetes Care and Education Specialist Please refer to [facility] for RD and/or RD on-call/weekend/after hours pager

## 2019-07-13 NOTE — Progress Notes (Signed)
Subjective: CC: Doing well. No abdominal pain, n/v in the last 24 hours. Tolerating CLD. Passing flatus. Last BM 4/25  Objective: Vital signs in last 24 hours: Temp:  [98.6 F (37 C)-99 F (37.2 C)] 98.8 F (37.1 C) (04/28 0643) Pulse Rate:  [96-100] 100 (04/28 0643) Resp:  [18-20] 18 (04/28 0643) BP: (130-139)/(86-92) 130/90 (04/28 0643) SpO2:  [97 %-98 %] 97 % (04/28 0643) Last BM Date: 07/10/19  Intake/Output from previous day: 04/27 0701 - 04/28 0700 In: 3131.7 [I.V.:3001.1; IV Piggyback:130.6] Out: 20 [Drains:20] Intake/Output this shift: No intake/output data recorded.  PE: Gen:  Alert, NAD, pleasant HEENT: EOM's intact, pupils equal and round Card:  RRR, no M/G/R heard Pulm:  CTAB, no W/R/R, effort normal Abd: Soft, NT/ND, +BS, Incisions with glue intact appears well and are without drainage, bleeding, or signs of infection. IR drain with SS output - 20cc/24 hours.  Ext:  No LE edema  Psych: A&Ox3  Skin: no rashes noted, warm and dry  Lab Results:  Recent Labs    07/11/19 0359 07/12/19 0458  WBC 14.2* 11.9*  HGB 13.2 13.8  HCT 39.4 41.1  PLT 275 281   BMET Recent Labs    07/11/19 0359  NA 136  K 4.0  CL 105  CO2 24  GLUCOSE 120*  BUN 8  CREATININE 0.70  CALCIUM 8.3*   PT/INR No results for input(s): LABPROT, INR in the last 72 hours. CMP     Component Value Date/Time   NA 136 07/11/2019 0359   K 4.0 07/11/2019 0359   CL 105 07/11/2019 0359   CO2 24 07/11/2019 0359   GLUCOSE 120 (H) 07/11/2019 0359   BUN 8 07/11/2019 0359   CREATININE 0.70 07/11/2019 0359   CALCIUM 8.3 (L) 07/11/2019 0359   PROT 6.6 07/11/2019 0359   ALBUMIN 2.2 (L) 07/11/2019 0359   AST 36 07/11/2019 0359   ALT 49 (H) 07/11/2019 0359   ALKPHOS 72 07/11/2019 0359   BILITOT 0.6 07/11/2019 0359   GFRNONAA >60 07/11/2019 0359   GFRAA >60 07/11/2019 0359   Lipase  No results found for: LIPASE     Studies/Results: CT ABDOMEN PELVIS W CONTRAST  Result  Date: 07/13/2019 CLINICAL DATA:  Abdominal abscess suspected, IR drain placement last week for abscess status post perforated appendicitis and appendectomy, ongoing nausea EXAM: CT ABDOMEN AND PELVIS WITH CONTRAST TECHNIQUE: Multidetector CT imaging of the abdomen and pelvis was performed using the standard protocol following bolus administration of intravenous contrast. CONTRAST:  154mL OMNIPAQUE IOHEXOL 350 MG/ML SOLN COMPARISON:  07/08/2019 FINDINGS: Lower chest: Dependent bibasilar atelectasis. Hepatobiliary: No solid liver abnormality is seen. No gallstones, gallbladder wall thickening, or biliary dilatation. Pancreas: Unremarkable. No pancreatic ductal dilatation or surrounding inflammatory changes. Spleen: Normal in size without significant abnormality. Adrenals/Urinary Tract: Adrenal glands are unremarkable. Kidneys are normal, without renal calculi, solid lesion, or hydronephrosis. Bladder is unremarkable. Stomach/Bowel: Stomach is within normal limits. Appendix is surgically absent. Inflammatory thickening and stranding of the bowel and mesentery in the vicinity of previously noted abscess in the right lower quadrant and in the low pelvis. Vascular/Lymphatic: No significant vascular findings are present. No enlarged abdominal or pelvic lymph nodes. Reproductive: No mass or other significant abnormality. Other: No abdominal wall hernia or abnormality. There is a midline approach percutaneous pigtail drainage catheter positioned in the right lower quadrant with formed pigtail; a previously noted air and fluid collection at this site is fully resolved (series 3, image 70).  There is a persistent, and slightly enlarged retrocecal air and fluid collection, measuring 7.3 x 2.5 x 2.4 cm (series 3, image 52, series 7, image 31). There is a persistent fluid collection in the low pelvis, slightly decreased in size measuring 3.9 x 2.2 x 2.4 cm (series 3, image 82, series 7, image 64). Musculoskeletal: No acute or  significant osseous findings. IMPRESSION: 1. There is a midline approach percutaneous pigtail drainage catheter positioned in the right lower quadrant with formed pigtail; a previously noted air and fluid collection at this site is fully resolved. 2. There is a persistent, and slightly enlarged retrocecal air and fluid collection, measuring 7.3 x 2.5 x 2.4 cm, consistent with abscess. 3. There is a persistent fluid collection in the low pelvis, slightly decreased in size measuring 3.9 x 2.2 x 2.4 cm, which may reflect loculated fluid or abscess. 4.  Status post appendectomy. Electronically Signed   By: Lauralyn Primes M.D.   On: 07/13/2019 09:04    Anti-infectives: Anti-infectives (From admission, onward)   Start     Dose/Rate Route Frequency Ordered Stop   07/10/19 1200  vancomycin (VANCOREADY) IVPB 1750 mg/350 mL  Status:  Discontinued     1,750 mg 175 mL/hr over 120 Minutes Intravenous Every 12 hours 07/10/19 1103 07/11/19 0800   07/05/19 2200  vancomycin (VANCOREADY) IVPB 1750 mg/350 mL  Status:  Discontinued     1,750 mg 175 mL/hr over 120 Minutes Intravenous Every 12 hours 07/05/19 2149 07/10/19 1103   07/05/19 1800  vancomycin (VANCOREADY) IVPB 1750 mg/350 mL  Status:  Discontinued     1,750 mg 175 mL/hr over 120 Minutes Intravenous Every 12 hours 07/05/19 0635 07/05/19 0745   07/05/19 1500  piperacillin-tazobactam (ZOSYN) IVPB 3.375 g     3.375 g 12.5 mL/hr over 240 Minutes Intravenous Every 8 hours 07/05/19 0635     07/05/19 0630  vancomycin (VANCOREADY) IVPB 2000 mg/400 mL  Status:  Discontinued     2,000 mg 200 mL/hr over 120 Minutes Intravenous  Once 07/05/19 0621 07/05/19 0805   07/05/19 0615  vancomycin (VANCOCIN) IVPB 1000 mg/200 mL premix  Status:  Discontinued     1,000 mg 200 mL/hr over 60 Minutes Intravenous  Once 07/05/19 0607 07/05/19 0621   07/05/19 0615  piperacillin-tazobactam (ZOSYN) IVPB 3.375 g     3.375 g 12.5 mL/hr over 240 Minutes Intravenous  Once 07/05/19 0160  07/05/19 1154       Assessment/Plan Hx GERD/nausea/vomiting at home Severe malnutrition - Prealbumin 6.0>>7.3  S/p Laparoscopic Appendectomy for perforated appendicitis (contained) - Dr. Maisie Fus 4/18- 07/04/19 - Readmitted 07/05/19 Post-Op Intra-abdominal fluid collection - s/p IR drain of RLQ abscess 4/23 - CT scan today with resolution of RLQ abscess. Decreased size of pelvic abscess and a rectrocecal fluid collection measuring 7.3x2.5x2.4cm. Will discuss with IR if amenable to drainage.  - Continue IV abx. - Monitor WBC count. AM labs pending.  - Mobilize, IS  FEN -TPN. NPO until here back from IR VTE -SCDs, Lovenox ID -Vanc 2/20-4/26;  Zosyn4/20 >>    LOS: 8 days    Jacinto Halim , [facility] 07/13/2019, 11:14 AM Please see Amion for pager number during day hours 7:00am-4:30pm

## 2019-07-13 NOTE — Progress Notes (Signed)
PHARMACY - TOTAL PARENTERAL NUTRITION CONSULT NOTE   Indication: Prolonged ileus  Patient Measurements: Height: 5\' 10"  (177.8 cm) Weight: 99.8 kg (220 lb) IBW/kg (Calculated) : 73 TPN AdjBW (KG): 79.7 Body mass index is 31.57 kg/m.  Assessment:  32 yom presented to the hospital after an appendectomy on 4/18 with fever and vomiting. Now with possible SBO vs paralytic ileus to start TPN for nutrition support.   Glucose / Insulin: CBGs <150, 1 units given since TPN started Electrolytes: K 4 (goal >/= 4 with ileus) - potassium added to IV fluids; Mag 2 (goal >/= 2 with ileus) Renal: WNL LFTs / TGs: LFTs down 57/58, TG 161>180 Prealbumin / albumin: Alb 3.4 > 2.1; prealbumin 6>7.3 Intake / Output; MIVF: D5NS with 5/18 K/L at 40ml/hr, drain output 20 mL - no urine charted GI Imaging: 4/18 CT abd - acute appendicitis 4/20 CT abd - gas and fluid collection at the operative site compatible with a developing abscess; Wall thickening is present in the terminal ileum with developing ileus 4/23 CT abd - multiple possible abscesses; partial small bowel obstruction versus paralytic ileus secondary to inflammation  Pending repeat ct abd today  Surgeries / Procedures:  4/18 Appendectomy 4/23 s/p RLQ JP drain, 45 ml thick foul smelling purulent fluid  Central access: Double lumen PICC placed 4/24 TPN start date: 4/24 >>  Nutritional Goals (per RD recommendation on 4/23): kCal: 2400-2600, Protein: 115-135, Fluid: >=2L Goal TPN rate is 182mL/hr (provides 126 g of protein and 2577 kcals per day)  Current Nutrition:  Clear liquids  TPN   Plan:  Continue TPN at goal rate of 198ml/hr This TPN provides 126 g of protein, 365 g of dextrose, and 83 g of lipids for a total of 2577 kcals meeting 100% of patient needs Electrolytes in TPN: standard concentration; Cl:Ac 1:1 Add MVI, trace elements to TPN continue Sensitive q6h SSI and adjust as needed Monitor TPN labs  0m, PharmD, BCPS,  BCCCP Clinical Pharmacist [phone]  Please check AMION for all [facility] Pharmacy numbers  07/13/2019 7:44 AM

## 2019-07-13 NOTE — Progress Notes (Signed)
Referring Physician(s): Dr. Magnus Ivan  Supervising Physician: Gilmer Mor  Patient Status:  [facility] - In-pt  Chief Complaint:  Intra-abdominal abscess  Subjective: Complains of chest heaviness. Mildly tachycardic; HR 99-105. Understands goals of procedure tomorrow.  Allergies: Patient has no known allergies.  Medications: Prior to Admission medications   Medication Sig Start Date End Date Taking? Authorizing Provider  acetaminophen (TYLENOL) 500 MG tablet Take 2 tablets (1,000 mg total) by mouth every 6 (six) hours. 07/04/19  Yes Simaan, Elizabeth S, PA-C  DEXILANT 60 MG capsule Take 1 capsule by mouth daily. 07/03/19  Yes [provider]  ondansetron (ZOFRAN) 4 MG tablet Take 4 mg by mouth every 6 (six) hours as needed. 07/04/19  Yes [provider]  oxyCODONE (OXY IR/ROXICODONE) 5 MG immediate release tablet Take 1 tablet (5 mg total) by mouth every 6 (six) hours as needed for severe pain (not relieved by tylenol or ibuprofen). 07/04/19  Yes Adam Phenix, PA-C     Vital Signs: BP 131/88   Pulse 90   Temp 98.6 F (37 C) (Oral)   Resp 18   Ht 5\' 10"  (1.778 m)   Wt 220 lb (99.8 kg)   SpO2 100%   BMI 31.57 kg/m   Physical Exam Constitutional:      Appearance: Normal appearance.  Cardiovascular:     Rate and Rhythm: Regular rhythm. Tachycardia present.  Pulmonary:     Effort: Pulmonary effort is normal. No respiratory distress.     Comments: Decreased breath sounds on the right Abdominal:     General: Abdomen is flat.     Palpations: Abdomen is soft.  Skin:    General: Skin is warm and dry.  Neurological:     General: No focal deficit present.     Mental Status: He is alert and oriented to person, place, and time. Mental status is at baseline.  Psychiatric:        Mood and Affect: Mood normal.        Behavior: Behavior normal.        Thought Content: Thought content normal.        Judgment: Judgment normal.     Imaging: CT ABDOMEN  PELVIS W CONTRAST  Result Date: 07/13/2019 CLINICAL DATA:  Abdominal abscess suspected, IR drain placement last week for abscess status post perforated appendicitis and appendectomy, ongoing nausea EXAM: CT ABDOMEN AND PELVIS WITH CONTRAST TECHNIQUE: Multidetector CT imaging of the abdomen and pelvis was performed using the standard protocol following bolus administration of intravenous contrast. CONTRAST:  07/15/2019 OMNIPAQUE IOHEXOL 350 MG/ML SOLN COMPARISON:  07/08/2019 FINDINGS: Lower chest: Dependent bibasilar atelectasis. Hepatobiliary: No solid liver abnormality is seen. No gallstones, gallbladder wall thickening, or biliary dilatation. Pancreas: Unremarkable. No pancreatic ductal dilatation or surrounding inflammatory changes. Spleen: Normal in size without significant abnormality. Adrenals/Urinary Tract: Adrenal glands are unremarkable. Kidneys are normal, without renal calculi, solid lesion, or hydronephrosis. Bladder is unremarkable. Stomach/Bowel: Stomach is within normal limits. Appendix is surgically absent. Inflammatory thickening and stranding of the bowel and mesentery in the vicinity of previously noted abscess in the right lower quadrant and in the low pelvis. Vascular/Lymphatic: No significant vascular findings are present. No enlarged abdominal or pelvic lymph nodes. Reproductive: No mass or other significant abnormality. Other: No abdominal wall hernia or abnormality. There is a midline approach percutaneous pigtail drainage catheter positioned in the right lower quadrant with formed pigtail; a previously noted air and fluid collection at this site is fully resolved (series 3,  image 70). There is a persistent, and slightly enlarged retrocecal air and fluid collection, measuring 7.3 x 2.5 x 2.4 cm (series 3, image 52, series 7, image 31). There is a persistent fluid collection in the low pelvis, slightly decreased in size measuring 3.9 x 2.2 x 2.4 cm (series 3, image 82, series 7, image 64).  Musculoskeletal: No acute or significant osseous findings. IMPRESSION: 1. There is a midline approach percutaneous pigtail drainage catheter positioned in the right lower quadrant with formed pigtail; a previously noted air and fluid collection at this site is fully resolved. 2. There is a persistent, and slightly enlarged retrocecal air and fluid collection, measuring 7.3 x 2.5 x 2.4 cm, consistent with abscess. 3. There is a persistent fluid collection in the low pelvis, slightly decreased in size measuring 3.9 x 2.2 x 2.4 cm, which may reflect loculated fluid or abscess. 4.  Status post appendectomy. Electronically Signed   By: Lauralyn Primes M.D.   On: 07/13/2019 09:04   DG CHEST PORT 1 VIEW  Result Date: 07/13/2019 CLINICAL DATA:  Shortness of breath. EXAM: PORTABLE CHEST 1 VIEW COMPARISON:  Radiograph 07/04/2019. FINDINGS: Low lung volumes. Linear perihilar opacities, right greater than left, likely atelectasis. No confluent airspace disease. No significant pleural fluid. No pneumothorax. Tip of the right upper extremity PICC is in the mid SVC. IMPRESSION: Low lung volumes with perihilar atelectasis. Electronically Signed   By: Narda Rutherford M.D.   On: 07/13/2019 16:48    Labs:  CBC: Recent Labs    07/10/19 1120 07/11/19 0359 07/12/19 0458 07/13/19 1256  WBC 13.8* 14.2* 11.9* 13.1*  HGB 12.5* 13.2 13.8 14.5  HCT 37.0* 39.4 41.1 42.9  PLT 228 275 281 320    COAGS: Recent Labs    07/03/19 1126 07/04/19 2229  INR 1.1 1.4*  APTT 28  --     BMP: Recent Labs    07/08/19 0519 07/09/19 0237 07/10/19 0410 07/11/19 0359  NA 140 141 138 136  K 3.6 3.5 3.6 4.0  CL 106 110 109 105  CO2 22 22 22 24   GLUCOSE 103* 149* 126* 120*  BUN 18 12 8 8   CALCIUM 8.6* 8.1* 7.9* 8.3*  CREATININE 0.98 0.85 0.73 0.70  GFRNONAA >60 >60 >60 >60  GFRAA >60 >60 >60 >60    LIVER FUNCTION TESTS: Recent Labs    07/04/19 2229 07/09/19 0237 07/10/19 0410 07/11/19 0359  BILITOT 1.4* 0.4 0.7  0.6  AST 24 131* 57* 36  ALT 16 80* 58* 49*  ALKPHOS 66 66 60 72  PROT 7.4 6.0* 5.6* 6.6  ALBUMIN 3.4* 2.1* 1.9* 2.2*    Assessment and Plan: Intra-abdominal fluid collection s/p drain placement 4/23 by Dr. 07/13/19 Patient s/p CT today for elevated WBC, increased abdominal pain.  CT reviewed by Dr. 5/23. Recommends aspiration vs. Drainage of right-sided elongated fluid collection.  An additional pelvic collection is not approachable at this time.  Current drain with 20 mL fluid so far today, but appears improved by CT.  Continue current management.  RN to notify MD of current vital signs and complaints.   NPO p MN.  Hold AM lovenox.   Follow clinical status.  Plan for procedure in IR tomorrow as clinical course and IR schedule allows.   Risks and benefits discussed with the patient including bleeding, infection, damage to adjacent structures, bowel perforation/fistula connection, and sepsis.  All of the patient's questions were answered, patient is agreeable to proceed. Consent signed and in chart.  Electronically Signed: Docia Barrier, PA 07/13/2019, 5:02 PM   I spent a total of 15 Minutes at the the patient's bedside AND on the patient's hospital floor or unit, greater than 50% of which was counseling/coordinating care for intra-abdominal fluid collection.

## 2019-07-13 NOTE — Progress Notes (Signed)
Patient ID: Matthew Pace, male   DOB: 08/08/1990, 29 y.o.   MRN: [id number]   CT today again shows fluid beside the cecum which may be abscess.  Given his increased WBC, IR will attempt placement of another drain.  The abscess with the current drain has almost resolved.  I explained the CT to the patient and his family. He reports feeling a little winded with mild chest pressure that occurred after an IV switch. I will order a pCXR stat.  Currently he looks comfortable and his vitals are stable.  There is no calf swelling or tenderness. If anything changes, he may need a chest CT to r/o a PE.

## 2019-07-14 ENCOUNTER — Inpatient Hospital Stay (HOSPITAL_COMMUNITY): Payer: BC Managed Care – PPO

## 2019-07-14 LAB — COMPREHENSIVE METABOLIC PANEL
ALT: 67 U/L — ABNORMAL HIGH (ref 0–44)
AST: 50 U/L — ABNORMAL HIGH (ref 15–41)
Albumin: 2.7 g/dL — ABNORMAL LOW (ref 3.5–5.0)
Alkaline Phosphatase: 118 U/L (ref 38–126)
Anion gap: 11 (ref 5–15)
BUN: 17 mg/dL (ref 6–20)
CO2: 25 mmol/L (ref 22–32)
Calcium: 8.9 mg/dL (ref 8.9–10.3)
Chloride: 98 mmol/L (ref 98–111)
Creatinine, Ser: 0.85 mg/dL (ref 0.61–1.24)
GFR calc Af Amer: >60 mL/min (ref >60)
GFR calc non Af Amer: >60 mL/min (ref >60)
Glucose, Bld: 128 mg/dL — ABNORMAL HIGH (ref 70–99)
Potassium: 4.2 mmol/L (ref 3.5–5.1)
Sodium: 134 mmol/L — ABNORMAL LOW (ref 135–145)
Total Bilirubin: 0.6 mg/dL (ref 0.3–1.2)
Total Protein: 7.8 g/dL (ref 6.5–8.1)

## 2019-07-14 LAB — CBC
HCT: 43 % (ref 39.0–52.0)
Hemoglobin: 14.4 g/dL (ref 13.0–17.0)
MCH: 30.4 pg (ref 26.0–34.0)
MCHC: 33.5 g/dL (ref 30.0–36.0)
MCV: 90.9 fL (ref 80.0–100.0)
Platelets: 342 10*3/uL (ref 150–400)
RBC: 4.73 MIL/uL (ref 4.22–5.81)
RDW: 11.9 % (ref 11.5–15.5)
WBC: 12.8 10*3/uL — ABNORMAL HIGH (ref 4.0–10.5)
nRBC: 0 % (ref 0.0–0.2)

## 2019-07-14 LAB — GLUCOSE, CAPILLARY
Glucose-Capillary: 111 mg/dL — ABNORMAL HIGH (ref 70–99)
Glucose-Capillary: 115 mg/dL — ABNORMAL HIGH (ref 70–99)
Glucose-Capillary: 121 mg/dL — ABNORMAL HIGH (ref 70–99)
Glucose-Capillary: 140 mg/dL — ABNORMAL HIGH (ref 70–99)

## 2019-07-14 LAB — MAGNESIUM: Magnesium: 2.2 mg/dL (ref 1.7–2.4)

## 2019-07-14 LAB — PHOSPHORUS: Phosphorus: 4.7 mg/dL — ABNORMAL HIGH (ref 2.5–4.6)

## 2019-07-14 LAB — AEROBIC/ANAEROBIC CULTURE W GRAM STAIN (SURGICAL/DEEP WOUND): Special Requests: NORMAL

## 2019-07-14 MED ORDER — FENTANYL CITRATE (PF) 100 MCG/2ML IJ SOLN
INTRAMUSCULAR | Status: AC | PRN
  Administered 2019-07-14 (×3): 50 ug via INTRAVENOUS

## 2019-07-14 MED ORDER — ENOXAPARIN SODIUM 40 MG/0.4ML ~~LOC~~ SOLN
40.0000 mg | Freq: Every day | SUBCUTANEOUS | Status: DC
  Administered 2019-07-15 – 2019-07-16 (×2): 40 mg via SUBCUTANEOUS
  Filled 2019-07-14 (×2): qty 0.4

## 2019-07-14 MED ORDER — MIDAZOLAM HCL 2 MG/2ML IJ SOLN
INTRAMUSCULAR | Status: AC
  Filled 2019-07-14: qty 2

## 2019-07-14 MED ORDER — SODIUM CHLORIDE 0.9% FLUSH
5.0000 mL | Freq: Three times a day (TID) | INTRAVENOUS | Status: DC
  Administered 2019-07-14 – 2019-07-16 (×6): 5 mL

## 2019-07-14 MED ORDER — FENTANYL CITRATE (PF) 100 MCG/2ML IJ SOLN
INTRAMUSCULAR | Status: AC
  Filled 2019-07-14: qty 2

## 2019-07-14 MED ORDER — MIDAZOLAM HCL 2 MG/2ML IJ SOLN
INTRAMUSCULAR | Status: AC | PRN
  Administered 2019-07-14 (×3): 1 mg via INTRAVENOUS

## 2019-07-14 MED ORDER — SUCRALFATE 1 GM/10ML PO SUSP
1.0000 g | Freq: Three times a day (TID) | ORAL | Status: DC
  Administered 2019-07-15 – 2019-07-16 (×6): 1 g via ORAL
  Filled 2019-07-14 (×6): qty 10

## 2019-07-14 MED ORDER — TRAVASOL 10 % IV SOLN
INTRAVENOUS | Status: AC
  Filled 2019-07-14: qty 1260

## 2019-07-14 NOTE — Progress Notes (Signed)
Subjective: CC: Chest tightness.  Patient reports yesterday around 2 PM he had an episode of "chest tightness" with associated lightheadedness and shortness of breath.  He denies any diaphoresis, nausea, emesis, chest pain, or pleuritic chest pain during this time.  He reports he was able to mobilize in the room without any increase shortness of breath during this time.  Chest x-ray was obtained that showed low lung volumes with perihilar atelectasis but no evidence of pneumonia, effusion or edema.  EKG NSR.  Patient made afebrile without  hypoxia during this time.  HR max 101 at 1953.  He has been able to pull 2500 on his I-S.  His symptoms resolved spontaneously at 9 PM without intervention I have not recurred.  He currently is without any chest pain, chest tightness, shortness of breath or cough.  He reports last night he had significant burping/belching/nausea after taking his medications with clear liquids.  He is currently NPO.  He reports that reflux and nausea has resolved.  He denies any abdominal pain in the last 24 hours.  He has been able to mobilize without any difficulties.  Objective: Vital signs in last 24 hours: Temp:  [98.5 F (36.9 C)-99.5 F (37.5 C)] 98.5 F (36.9 C) (04/29 0529) Pulse Rate:  [85-101] 85 (04/29 0529) Resp:  [17-20] 17 (04/29 0529) BP: (131-137)/(75-91) 132/75 (04/29 0529) SpO2:  [98 %-100 %] 98 % (04/29 0529) Last BM Date: 07/10/19  Intake/Output from previous day: 04/28 0701 - 04/29 0700 In: 3475.9 [P.O.:300; I.V.:2933.1; IV Piggyback:237.8] Out: 20 [Drains:20] Intake/Output this shift: No intake/output data recorded.  PE: Gen:  Alert, NAD, pleasant HEENT: EOM's intact, pupils equal and round Card:  RRR Pulm:  No increased work of breathing. No accessory muscle use. Patient is sitting upright, speaking in full sentences without difficulty.  Normal rate and effort.  Lungs clear to auscultation bilaterally without wheezing, rhonchi or rails.   On room air. Pulling 2500 IS.  Abd: Soft, NT/ND, +BS. Incisions with glue intact appears well and are without drainage, bleeding, or signs of infection. IR drain with scant SS fluid in drain. 20cc/24 hours.  Ext:  No erythema, edema, or tenderness BUE/BLE. No calf tenderness Psych: A&Ox3  Skin: no rashes noted, warm and dry  Lab Results:  Recent Labs    07/13/19 1256 07/14/19 0412  WBC 13.1* 12.8*  HGB 14.5 14.4  HCT 42.9 43.0  PLT 320 342   BMET Recent Labs    07/14/19 0412  NA 134*  K 4.2  CL 98  CO2 25  GLUCOSE 128*  BUN 17  CREATININE 0.85  CALCIUM 8.9   PT/INR No results for input(s): LABPROT, INR in the last 72 hours. CMP     Component Value Date/Time   NA 134 (L) 07/14/2019 0412   K 4.2 07/14/2019 0412   CL 98 07/14/2019 0412   CO2 25 07/14/2019 0412   GLUCOSE 128 (H) 07/14/2019 0412   BUN 17 07/14/2019 0412   CREATININE 0.85 07/14/2019 0412   CALCIUM 8.9 07/14/2019 0412   PROT 7.8 07/14/2019 0412   ALBUMIN 2.7 (L) 07/14/2019 0412   AST 50 (H) 07/14/2019 0412   ALT 67 (H) 07/14/2019 0412   ALKPHOS 118 07/14/2019 0412   BILITOT 0.6 07/14/2019 0412   GFRNONAA >60 07/14/2019 0412   GFRAA >60 07/14/2019 0412   Lipase  No results found for: LIPASE     Studies/Results: CT ABDOMEN PELVIS W CONTRAST  Result Date: 07/13/2019  CLINICAL DATA:  Abdominal abscess suspected, IR drain placement last week for abscess status post perforated appendicitis and appendectomy, ongoing nausea EXAM: CT ABDOMEN AND PELVIS WITH CONTRAST TECHNIQUE: Multidetector CT imaging of the abdomen and pelvis was performed using the standard protocol following bolus administration of intravenous contrast. CONTRAST:  OMNIPAQUE IOHEXOL 350 MG/ML SOLN COMPARISON:  07/08/2019 FINDINGS: Lower chest: Dependent bibasilar atelectasis. Hepatobiliary: No solid liver abnormality is seen. No gallstones, gallbladder wall thickening, or biliary dilatation. Pancreas: Unremarkable. No pancreatic  ductal dilatation or surrounding inflammatory changes. Spleen: Normal in size without significant abnormality. Adrenals/Urinary Tract: Adrenal glands are unremarkable. Kidneys are normal, without renal calculi, solid lesion, or hydronephrosis. Bladder is unremarkable. Stomach/Bowel: Stomach is within normal limits. Appendix is surgically absent. Inflammatory thickening and stranding of the bowel and mesentery in the vicinity of previously noted abscess in the right lower quadrant and in the low pelvis. Vascular/Lymphatic: No significant vascular findings are present. No enlarged abdominal or pelvic lymph nodes. Reproductive: No mass or other significant abnormality. Other: No abdominal wall hernia or abnormality. There is a midline approach percutaneous pigtail drainage catheter positioned in the right lower quadrant with formed pigtail; a previously noted air and fluid collection at this site is fully resolved (series 3, image 70). There is a persistent, and slightly enlarged retrocecal air and fluid collection, measuring 7.3 x 2.5 x 2.4 cm (series 3, image 52, series 7, image 31). There is a persistent fluid collection in the low pelvis, slightly decreased in size measuring 3.9 x 2.2 x 2.4 cm (series 3, image 82, series 7, image 64). Musculoskeletal: No acute or significant osseous findings. IMPRESSION: 1. There is a midline approach percutaneous pigtail drainage catheter positioned in the right lower quadrant with formed pigtail; a previously noted air and fluid collection at this site is fully resolved. 2. There is a persistent, and slightly enlarged retrocecal air and fluid collection, measuring 7.3 x 2.5 x 2.4 cm, consistent with abscess. 3. There is a persistent fluid collection in the low pelvis, slightly decreased in size measuring 3.9 x 2.2 x 2.4 cm, which may reflect loculated fluid or abscess. 4.  Status post appendectomy. Electronically Signed   By: Lauralyn Primes M.D.   On: 07/13/2019 09:04   DG CHEST  PORT 1 VIEW  Result Date: 07/13/2019 CLINICAL DATA:  Shortness of breath. EXAM: PORTABLE CHEST 1 VIEW COMPARISON:  Radiograph 07/04/2019. FINDINGS: Low lung volumes. Linear perihilar opacities, right greater than left, likely atelectasis. No confluent airspace disease. No significant pleural fluid. No pneumothorax. Tip of the right upper extremity PICC is in the mid SVC. IMPRESSION: Low lung volumes with perihilar atelectasis. Electronically Signed   By: Narda Rutherford M.D.   On: 07/13/2019 16:48    Anti-infectives: Anti-infectives (From admission, onward)   Start     Dose/Rate Route Frequency Ordered Stop   07/10/19 1200  vancomycin (VANCOREADY) IVPB 1750 mg/350 mL  Status:  Discontinued     1,750 mg 175 mL/hr over 120 Minutes Intravenous Every 12 hours 07/10/19 1103 07/11/19 0800   07/05/19 2200  vancomycin (VANCOREADY) IVPB 1750 mg/350 mL  Status:  Discontinued     1,750 mg 175 mL/hr over 120 Minutes Intravenous Every 12 hours 07/05/19 2149 07/10/19 1103   07/05/19 1800  vancomycin (VANCOREADY) IVPB 1750 mg/350 mL  Status:  Discontinued     1,750 mg 175 mL/hr over 120 Minutes Intravenous Every 12 hours 07/05/19 0635 07/05/19 0745   07/05/19 1500  piperacillin-tazobactam (ZOSYN) IVPB 3.375 g  3.375 g 12.5 mL/hr over 240 Minutes Intravenous Every 8 hours 07/05/19 0635     07/05/19 0630  vancomycin (VANCOREADY) IVPB 2000 mg/400 mL  Status:  Discontinued     2,000 mg 200 mL/hr over 120 Minutes Intravenous  Once 07/05/19 0621 07/05/19 0805   07/05/19 0615  vancomycin (VANCOCIN) IVPB 1000 mg/200 mL premix  Status:  Discontinued     1,000 mg 200 mL/hr over 60 Minutes Intravenous  Once 07/05/19 0607 07/05/19 0621   07/05/19 0615  piperacillin-tazobactam (ZOSYN) IVPB 3.375 g     3.375 g 12.5 mL/hr over 240 Minutes Intravenous  Once 07/05/19 6578 07/05/19 1154       Assessment/Plan -Hx GERD/nausea/vomiting at home -Severe malnutrition - Prealbumin 6.0>>7.3. On TPN - Chest  tightness episode 4/28 -spontaneously resolved and has not recurred.  Chest x-ray with atelectasis but otherwise without any active disease.  EKG NSR.  Patient afebrile without hypoxia on room air. No current tachycardia this AM. He is pulling 2500 on his I-S.  His lungs are clear to auscultation bilaterally.  He is currently asymptomatic.  No further work-up indicated at this time.  If this recurs he may require further work-up.   S/p Laparoscopic Appendectomy for perforated appendicitis (contained) - Dr. Marcello Moores 4/18- 07/04/19 - Readmitted 07/05/19 Post-Op Intra-abdominal fluid collection - s/p IR drain of RLQ abscess 4/23. Drain per IR - CT scan 4/28 with resolution of RLQ abscess. Decreased size of pelvic abscess and a rectrocecal fluid collection measuring 7.3x2.5x2.4cm. - IR to drain retrocecal fluid collection today - Continue IV abx.  - Mobilize, IS  FEN -TPN. NPO for IR procedure  VTE -SCDs, Lovenox on hold for IR procedure  ID -Vanc2/[id number]/26;Zosyn4/20 >> WBC down at 12.8 Foley - None Follow-Up: Dr. Marcello Moores    LOS: 9 days    Jillyn Ledger , [facility] Surgery 07/14/2019, 8:03 AM Please see Amion for pager number during day hours 7:00am-4:30pm

## 2019-07-14 NOTE — Progress Notes (Addendum)
ANTICOAGULATION CONSULT NOTE - Initial Consult  Pharmacy Consult for Restarting Anticoagulants/ Antiplatelets Post IR Procedure Indication: VTE prophylaxis  No Known Allergies  Patient Measurements: Height: 5\' 10"  (177.8 cm) Weight: 99.8 kg (220 lb) IBW/kg (Calculated) : 73  Vital Signs: Temp: 98.9 F (37.2 C) (04/29 1645) Temp Source: Oral (04/29 1645) BP: 115/69 (04/29 1645) Pulse Rate: 88 (04/29 1645)  Labs: Recent Labs    07/12/19 0458 07/12/19 0458 07/13/19 1256 07/14/19 0412  HGB 13.8   < > 14.5 14.4  HCT 41.1  --  42.9 43.0  PLT 281  --  320 342  CREATININE  --   --   --  0.85   < > = values in this interval not displayed.    Estimated Creatinine Clearance: 154.5 mL/min (by C-G formula based on SCr of 0.85 mg/dL).   Medical History: Past Medical History:  Diagnosis Date  . GERD (gastroesophageal reflux disease)     Assessment: 29 yr old male admitted on 07/04/19 with appendicitis with abscess and perforation, S/P lap appendectomy.   Today, pt is S/P placement of CT drain into R paracolic gutter by IR (standard bleeding risk procedure, per consult text). Pharmacy is consulted to restart anticoagulants, per protocol for restarting after IR procedure. Pt has been receiving enoxaparin 40 mg SQ daily for VTE prophylaxis (last dose at 0906 yesterday); per post-IR anticoagulant protocol, will restart enoxaparin tomorrow AM.  H/H, platelets WNL; TBW CrCl >100 ml/min; per RN, pt has small amt of bloody fluid from drain  Goal of Therapy:  Prevention of VTE Monitor platelets by anticoagulation protocol: Yes   Plan:  Resume pt's pre-IR enoxaparin regimen (40 mg SQ daily) tomorrow AM (07/15/19) Monitor CBC Monitor for signs/symptoms of bleeding  07/17/19, PharmD, BCPS, [facility] Clinical Pharmacist 07/14/2019,5:37 PM

## 2019-07-14 NOTE — Procedures (Signed)
Interventional Radiology Procedure Note  Procedure: CT drain placed, 1F via RLQ into the right paracolic gutter collection yielding 10 mL thick, purulent fluid.   Complications: None  Estimated Blood Loss: None  Recommendations: - Cultures sent - Drain to JP - Flush Q shift  Signed,  Sterling Big, MD

## 2019-07-14 NOTE — Progress Notes (Signed)
PHARMACY - TOTAL PARENTERAL NUTRITION CONSULT NOTE   Indication: Prolonged ileus  Patient Measurements: Height: 5\' 10"  (177.8 cm) Weight: 99.8 kg (220 lb) IBW/kg (Calculated) : 73 TPN AdjBW (KG): 79.7 Body mass index is 31.57 kg/m.  Assessment:  39 yom presented to the hospital after an appendectomy on 4/18 with fever and vomiting. Now with possible SBO vs paralytic ileus to start TPN for nutrition support.   Glucose / Insulin: CBGs <150, 4 units given since TPN started Electrolytes: Na 134 K 4.2 (goal >/= 4 with ileus)  Mag 2.2 (goal >/= 2 with ileus), Phos 4.7 Renal: WNL LFTs / TGs: LFTs down 57/58, TG 161>180 Prealbumin / albumin: Alb 3.4 > 2.1; prealbumin 6>7.3 Intake / Output; MIVF: D5NS with 5/18 K/L at 57ml/hr, drain output 20 mL - urine x 4 GI Imaging: 4/18 CT abd - acute appendicitis 4/20 CT abd - gas and fluid collection at the operative site compatible with a developing abscess; Wall thickening is present in the terminal ileum with developing ileus 4/23 CT abd - multiple possible abscesses; partial small bowel obstruction versus paralytic ileus secondary to inflammation 4/28 CT abd - fluid beside cecum, likely abscess  Surgeries / Procedures:  4/18 Appendectomy 4/23 s/p RLQ JP drain, 45 ml thick foul smelling purulent fluid 4/29 pending IR draining of possible abscess  Central access: Double lumen PICC placed 4/24 TPN start date: 4/24 >>  Nutritional Goals (per RD recommendation on 4/23): kCal: 2400-2600, Protein: 115-135, Fluid: >=2L Goal TPN rate is 133mL/hr (provides 126 g of protein and 2577 kcals per day)  Current Nutrition:  Clear liquids  TPN   Plan:  Continue TPN at goal rate of 169ml/hr This TPN provides 126 g of protein, 365 g of dextrose, and 83 g of lipids for a total of 2577 kcals meeting 100% of patient needs Electrolytes in TPN: standard concentration, except increase Na and remove phos for now; Cl:Ac 1:1 Continue MVI, trace elements  TPN continue Sensitive q6h SSI and adjust as needed Monitor TPN labs, bmet phos in am  DC maintenance fluids  0m, PharmD, BCPS, BCCCP Clinical Pharmacist [phone]  Please check AMION for all [facility] Pharmacy numbers  07/14/2019 8:34 AM

## 2019-07-15 LAB — BASIC METABOLIC PANEL
Anion gap: 9 (ref 5–15)
BUN: 15 mg/dL (ref 6–20)
CO2: 26 mmol/L (ref 22–32)
Calcium: 8.9 mg/dL (ref 8.9–10.3)
Chloride: 101 mmol/L (ref 98–111)
Creatinine, Ser: 0.85 mg/dL (ref 0.61–1.24)
GFR calc Af Amer: >60 mL/min (ref >60)
GFR calc non Af Amer: >60 mL/min (ref >60)
Glucose, Bld: 132 mg/dL — ABNORMAL HIGH (ref 70–99)
Potassium: 4.2 mmol/L (ref 3.5–5.1)
Sodium: 136 mmol/L (ref 135–145)

## 2019-07-15 LAB — GLUCOSE, CAPILLARY
Glucose-Capillary: 100 mg/dL — ABNORMAL HIGH (ref 70–99)
Glucose-Capillary: 105 mg/dL — ABNORMAL HIGH (ref 70–99)
Glucose-Capillary: 125 mg/dL — ABNORMAL HIGH (ref 70–99)
Glucose-Capillary: 95 mg/dL (ref 70–99)

## 2019-07-15 LAB — CBC
HCT: 41 % (ref 39.0–52.0)
Hemoglobin: 13.7 g/dL (ref 13.0–17.0)
MCH: 30.4 pg (ref 26.0–34.0)
MCHC: 33.4 g/dL (ref 30.0–36.0)
MCV: 90.9 fL (ref 80.0–100.0)
Platelets: 324 10*3/uL (ref 150–400)
RBC: 4.51 MIL/uL (ref 4.22–5.81)
RDW: 11.9 % (ref 11.5–15.5)
WBC: 8.8 10*3/uL (ref 4.0–10.5)
nRBC: 0 % (ref 0.0–0.2)

## 2019-07-15 LAB — PHOSPHORUS: Phosphorus: 3.6 mg/dL (ref 2.5–4.6)

## 2019-07-15 NOTE — Plan of Care (Signed)

## 2019-07-15 NOTE — Discharge Instructions (Signed)
[facility]  Please arrive at least 30 min before your appointment to complete your check in paperwork.  If you are unable to arrive 30 min prior to your appointment time we may have to cancel or reschedule you. LAPAROSCOPIC SURGERY: POST OP INSTRUCTIONS Always review your discharge instruction sheet given to you by the facility where your surgery was performed. IF YOU HAVE DISABILITY OR FAMILY LEAVE FORMS, YOU MUST BRING THEM TO THE OFFICE FOR PROCESSING.   DO NOT GIVE THEM TO YOUR DOCTOR.  PAIN CONTROL  1. First take acetaminophen (Tylenol) AND/or ibuprofen (Advil) to control your pain after surgery.  Follow directions on package.  Taking acetaminophen (Tylenol) and/or ibuprofen (Advil) regularly after surgery will help to control your pain and lower the amount of prescription pain medication you may need.  You should not take more than 4,000 mg (4 grams) of acetaminophen (Tylenol) in 24 hours.  You should not take ibuprofen (Advil), aleve, motrin, naprosyn or other NSAIDS if you have a history of stomach ulcers or chronic kidney disease.  2. A prescription for pain medication may be given to you upon discharge.  Take your pain medication as prescribed, if you still have uncontrolled pain after taking acetaminophen (Tylenol) or ibuprofen (Advil). 3. Use ice packs to help control pain. 4. If you need a refill on your pain medication, please contact your pharmacy.  They will contact our office to request authorization. Prescriptions will not be filled after 5pm or on week-ends.  HOME MEDICATIONS 5. Take your usually prescribed medications unless otherwise directed.  DIET 6. You should follow a light diet the first few days after arrival home.  Be sure to include lots of fluids daily. Avoid fatty, fried foods.   CONSTIPATION 7. It is common to experience some constipation after surgery and if you are taking pain medication.  Increasing fluid intake and taking a stool  softener (such as Colace) will usually help or prevent this problem from occurring.  A mild laxative (Milk of Magnesia or Miralax) should be taken according to package instructions if there are no bowel movements after 48 hours.  WOUND/INCISION CARE 8. Most patients will experience some swelling and bruising in the area of the incisions.  Ice packs will help.  Swelling and bruising can take several days to resolve.  9. Unless discharge instructions indicate otherwise, follow guidelines below  a. STERI-STRIPS - you may remove your outer bandages 48 hours after surgery, and you may shower at that time.  You have steri-strips (small skin tapes) in place directly over the incision.  These strips should be left on the skin for 7-10 days.   b. DERMABOND/SKIN GLUE - you may shower in 24 hours.  The glue will flake off over the next 2-3 weeks. 10. Any sutures or staples will be removed at the office during your follow-up visit.  ACTIVITIES 11. You may resume regular (light) daily activities beginning the next day--such as daily self-care, walking, climbing stairs--gradually increasing activities as tolerated.  You may have sexual intercourse when it is comfortable.  Refrain from any heavy lifting or straining until approved by your doctor. a. You may drive when you are no longer taking prescription pain medication, you can comfortably wear a seatbelt, and you can safely maneuver your car and apply brakes.  FOLLOW-UP 12. You should see your doctor in the office for a follow-up appointment approximately 2-3 weeks after your surgery.  You should have been given your post-op/follow-up appointment when   your surgery was scheduled.  If you did not receive a post-op/follow-up appointment, make sure that you call for this appointment within a day or two after you arrive home to insure a convenient appointment time.   WHEN TO CALL YOUR DOCTOR: 1. Fever over 101.0 2. Inability to urinate 3. Continued bleeding from  incision. 4. Increased pain, redness, or drainage from the incision. 5. Increasing abdominal pain  The clinic staff is available to answer your questions during regular business hours.  Please don't hesitate to call and ask to speak to one of the nurses for clinical concerns.  If you have a medical emergency, go to the nearest emergency room or call 911.  A surgeon from [facility] is always on call at the hospital. [address], [place], [place], [place]  [address] ? [address], [place], [place]   [address] [phone] ? [phone] ? FAX [phone]   Percutaneous Abscess Drain, Care After This sheet gives you information about how to care for yourself after your procedure. Your health care provider may also give you more specific instructions. If you have problems or questions, contact your health care provider. What can I expect after the procedure? After your procedure, it is common to have:  A small amount of bruising and discomfort in the area where the drainage tube (catheter) was placed.  Sleepiness and fatigue. This should go away after the medicines you were given have worn off. Follow these instructions at home: Incision care  Follow instructions from your health care provider about how to take care of your incision. Make sure you: ? Wash your hands with soap and water before you change your bandage (dressing). If soap and water are not available, use hand sanitizer. ? Change your dressing as told by your health care provider. ? Leave stitches (sutures), skin glue, or adhesive strips in place. These skin closures may need to stay in place for 2 weeks or longer. If adhesive strip edges start to loosen and curl up, you may trim the loose edges. Do not remove adhesive strips completely unless your health care provider tells you to do that.  Check your incision area every day for signs of infection. Check for: ? More redness, swelling, or pain. ? More  fluid or blood. ? Warmth. ? Pus or a bad smell. ? Fluid leaking from around your catheter (instead of fluid draining through your catheter). Catheter care   Follow instructions from your health care provider about emptying and cleaning your catheter and collection bag. You may need to clean the catheter every day so it does not clog.  If directed, write down the following information every time you empty your bag: ? The date and time. ? The amount of drainage. General instructions  Rest at home for 1-2 days after your procedure. Return to your normal activities as told by your health care provider.  Do not take baths, swim, or use a hot tub for 24 hours after your procedure, or until your health care provider says that this is okay.  Take over-the-counter and prescription medicines only as told by your health care provider.  Keep all follow-up visits as told by your health care provider. This is important. Contact a health care provider if:  You have less than 10 mL of drainage a day for 2-3 days in a row, or as directed by your health care provider.  You have more redness, swelling, or pain around your incision area.  You have more fluid or blood coming from your incision area.  Your incision area feels warm to the touch.  You have pus or a bad smell coming from your incision area.  You have fluid leaking from around your catheter (instead of through your catheter).  You have a fever or chills.  You have pain that does not get better with medicine. Get help right away if:  Your catheter comes out.  You suddenly stop having drainage from your catheter.  You suddenly have blood in the fluid that is draining from your catheter.  You become dizzy or you faint.  You develop a rash.  You have nausea or vomiting.  You have difficulty breathing or you feel short of breath.  You develop chest pain.  You have problems with your speech or vision.  You have trouble  balancing or moving your arms or legs. Summary  It is common to have a small amount of bruising and discomfort in the area where the drainage tube (catheter) was placed.  You may be directed to record the amount of drainage from the bag every time you empty it.  Follow instructions from your health care provider about emptying and cleaning your catheter and collection bag. This information is not intended to replace advice given to you by your health care provider. Make sure you discuss any questions you have with your health care provider. Document Revised: 02/13/2017 Document Reviewed: 01/24/2016 Elsevier Patient Education  2020 [employer].

## 2019-07-15 NOTE — Progress Notes (Signed)
Subjective: CC: Patient reports he is doing well.   He underwent IR procedure yesterday where of thick, purulent fluid was drained from right paracolic gutter collection and a 10 French drain was left.  He reports no abdominal pain except for a mild amount around his drain site. He is tolerating a regular diet and slowly increasing back to his normal amount he ate prior to hospitalization.  He reports he ate [employer] chicken nuggets and fries yesterday after IR procedure. He reports that his reflux has improved overnight.  He denies any nausea or emesis.  He is passing gas.  Having very small amounts of liquid bowel movements when he sits down to use the restroom.  He is mobilizing without difficulty.  He is urinating without difficulty.  No further episodes of chest tightness or shortness of breath.  He denies any chest pain or lightheadedness.  He has been able to ambulate without dyspnea.  Objective: Vital signs in last 24 hours: Temp:  [98.3 F (36.8 C)-99.2 F (37.3 C)] 98.3 F (36.8 C) (04/30 0505) Pulse Rate:  [84-104] 93 (04/30 0505) Resp:  [11-22] 18 (04/30 0505) BP: (115-139)/(64-97) 131/75 (04/30 0505) SpO2:  [96 %-99 %] 97 % (04/30 0505) Last BM Date: 07/14/19  Intake/Output from previous day: 04/29 0701 - 04/30 0700 In: 2725 [P.O.:240; I.V.:2333.6; IV Piggyback:131.4] Out: 45 [Drains:45] Intake/Output this shift: No intake/output data recorded.  PE: Gen:  Alert, NAD, pleasant HEENT: EOM's intact, pupils equal and round Card:  RRR Pulm:  No increased work of breathing. No accessory muscle use. Patient is sitting upright, speaking in full sentences without difficulty.  Normal rate and effort.  Lungs clear to auscultation bilaterally without wheezing, rhonchi or rails.  On room air.   Abd: Soft, NT/ND, +BS. Incisions with glue intact appears well and are without drainage, bleeding, or signs of infection. RLQ IR drain with scant SS fluid in drain. 15cc/24  hours. Right lateral abdominal IR drain with some bloody purulent material. 30cc/24 hours.   Ext:  No LE edema  Psych: A&Ox3  Skin: no rashes noted, warm and dry  Lab Results:  Recent Labs    07/13/19 1256 07/14/19 0412  WBC 13.1* 12.8*  HGB 14.5 14.4  HCT 42.9 43.0  PLT 320 342   BMET Recent Labs    07/14/19 0412 07/15/19 0451  NA 134* 136  K 4.2 4.2  CL 98 101  CO2 25 26  GLUCOSE 128* 132*  BUN 17 15  CREATININE 0.85 0.85  CALCIUM 8.9 8.9   PT/INR No results for input(s): LABPROT, INR in the last 72 hours. CMP     Component Value Date/Time   NA 136 07/15/2019 0451   K 4.2 07/15/2019 0451   CL 101 07/15/2019 0451   CO2 26 07/15/2019 0451   GLUCOSE 132 (H) 07/15/2019 0451   BUN 15 07/15/2019 0451   CREATININE 0.85 07/15/2019 0451   CALCIUM 8.9 07/15/2019 0451   PROT 7.8 07/14/2019 0412   ALBUMIN 2.7 (L) 07/14/2019 0412   AST 50 (H) 07/14/2019 0412   ALT 67 (H) 07/14/2019 0412   ALKPHOS 118 07/14/2019 0412   BILITOT 0.6 07/14/2019 0412   GFRNONAA >60 07/15/2019 0451   GFRAA >60 07/15/2019 0451   Lipase  No results found for: LIPASE     Studies/Results: CT ABDOMEN PELVIS W CONTRAST  Result Date: 07/13/2019 CLINICAL DATA:  Abdominal abscess suspected, IR drain placement last week for abscess status post perforated  appendicitis and appendectomy, ongoing nausea EXAM: CT ABDOMEN AND PELVIS WITH CONTRAST TECHNIQUE: Multidetector CT imaging of the abdomen and pelvis was performed using the standard protocol following bolus administration of intravenous contrast. CONTRAST:  OMNIPAQUE IOHEXOL 350 MG/ML SOLN COMPARISON:  07/08/2019 FINDINGS: Lower chest: Dependent bibasilar atelectasis. Hepatobiliary: No solid liver abnormality is seen. No gallstones, gallbladder wall thickening, or biliary dilatation. Pancreas: Unremarkable. No pancreatic ductal dilatation or surrounding inflammatory changes. Spleen: Normal in size without significant abnormality.  Adrenals/Urinary Tract: Adrenal glands are unremarkable. Kidneys are normal, without renal calculi, solid lesion, or hydronephrosis. Bladder is unremarkable. Stomach/Bowel: Stomach is within normal limits. Appendix is surgically absent. Inflammatory thickening and stranding of the bowel and mesentery in the vicinity of previously noted abscess in the right lower quadrant and in the low pelvis. Vascular/Lymphatic: No significant vascular findings are present. No enlarged abdominal or pelvic lymph nodes. Reproductive: No mass or other significant abnormality. Other: No abdominal wall hernia or abnormality. There is a midline approach percutaneous pigtail drainage catheter positioned in the right lower quadrant with formed pigtail; a previously noted air and fluid collection at this site is fully resolved (series 3, image 70). There is a persistent, and slightly enlarged retrocecal air and fluid collection, measuring 7.3 x 2.5 x 2.4 cm (series 3, image 52, series 7, image 31). There is a persistent fluid collection in the low pelvis, slightly decreased in size measuring 3.9 x 2.2 x 2.4 cm (series 3, image 82, series 7, image 64). Musculoskeletal: No acute or significant osseous findings. IMPRESSION: 1. There is a midline approach percutaneous pigtail drainage catheter positioned in the right lower quadrant with formed pigtail; a previously noted air and fluid collection at this site is fully resolved. 2. There is a persistent, and slightly enlarged retrocecal air and fluid collection, measuring 7.3 x 2.5 x 2.4 cm, consistent with abscess. 3. There is a persistent fluid collection in the low pelvis, slightly decreased in size measuring 3.9 x 2.2 x 2.4 cm, which may reflect loculated fluid or abscess. 4.  Status post appendectomy. Electronically Signed   By: Lauralyn Primes M.D.   On: 07/13/2019 09:04   DG CHEST PORT 1 VIEW  Result Date: 07/13/2019 CLINICAL DATA:  Shortness of breath. EXAM: PORTABLE CHEST 1 VIEW  COMPARISON:  Radiograph 07/04/2019. FINDINGS: Low lung volumes. Linear perihilar opacities, right greater than left, likely atelectasis. No confluent airspace disease. No significant pleural fluid. No pneumothorax. Tip of the right upper extremity PICC is in the mid SVC. IMPRESSION: Low lung volumes with perihilar atelectasis. Electronically Signed   By: Narda Rutherford M.D.   On: 07/13/2019 16:48   CT IMAGE GUIDED FLUID DRAIN BY CATHETER  Result Date: 07/14/2019 INDICATION: 29 year old male with a history of appendicitis status post laparoscopic appendectomy complicated by postoperative abscess formation. He underwent placement of a CT-guided drain on 07/08/2019. Follow-up CT imaging demonstrates a new developing abscess collection along the right pericolic gutter. EXAM: CT-guided drain placement MEDICATIONS: The patient is currently admitted to the hospital and receiving intravenous antibiotics. The antibiotics were administered within an appropriate time frame prior to the initiation of the procedure. ANESTHESIA/SEDATION: Fentanyl 150 mcg IV; Versed 3 mg IV Moderate Sedation Time:  15 minutes The patient was continuously monitored during the procedure by the interventional radiology nurse under my direct supervision. COMPLICATIONS: None immediate. PROCEDURE: Informed written consent was obtained from the patient after a thorough discussion of the procedural risks, benefits and alternatives. All questions were addressed. Maximal Sterile Barrier Technique  was utilized including caps, mask, sterile gowns, sterile gloves, sterile drape, hand hygiene and skin antiseptic. A timeout was performed prior to the initiation of the procedure. A planning axial CT scan was performed. The fluid and gas collection in the right lower quadrant was successfully identified. A suitable skin entry site was selected and marked. The overlying skin was sterilely prepped and draped in the standard fashion using chlorhexidine skin  prep. Local anesthesia was attained by infiltration with 1% lidocaine. A small dermatotomy was made. Under intermittent CT guidance, an 18 gauge trocar needle was carefully advanced into the fluid and gas collection. An Amplatz wire was then coiled in the collection. The needle was removed. The skin tract was dilated to 10 [place]. A Cook 10 [place] all-purpose drainage catheter was advanced over the wire and formed. Aspiration yields 10 mL of thick purulent fluid. A simple the fluid was sent for Gram stain and culture. The drain was gently flushed and then secured to the skin with 0 Prolene suture and an adhesive fixation device. The patient tolerated the procedure well. IMPRESSION: Successful placement of a 10 French drain into the right pericolic gutter abscess yielding 10 mL of thick, purulent fluid. Signed, Sterling Big, MD, RPVI [facility] Electronically Signed   By: Malachy Moan M.D.   On: 07/14/2019 17:51    Anti-infectives: Anti-infectives (From admission, onward)   Start     Dose/Rate Route Frequency Ordered Stop   07/10/19 1200  vancomycin (VANCOREADY) IVPB 1750 mg/350 mL  Status:  Discontinued     1,750 mg 175 mL/hr over 120 Minutes Intravenous Every 12 hours 07/10/19 1103 07/11/19 0800   07/05/19 2200  vancomycin (VANCOREADY) IVPB 1750 mg/350 mL  Status:  Discontinued     1,750 mg 175 mL/hr over 120 Minutes Intravenous Every 12 hours 07/05/19 2149 07/10/19 1103   07/05/19 1800  vancomycin (VANCOREADY) IVPB 1750 mg/350 mL  Status:  Discontinued     1,750 mg 175 mL/hr over 120 Minutes Intravenous Every 12 hours 07/05/19 0635 07/05/19 0745   07/05/19 1500  piperacillin-tazobactam (ZOSYN) IVPB 3.375 g     3.375 g 12.5 mL/hr over 240 Minutes Intravenous Every 8 hours 07/05/19 0635     07/05/19 0630  vancomycin (VANCOREADY) IVPB 2000 mg/400 mL  Status:  Discontinued     2,000 mg 200 mL/hr over 120 Minutes Intravenous   Once 07/05/19 0621 07/05/19 0805   07/05/19 0615  vancomycin (VANCOCIN) IVPB 1000 mg/200 mL premix  Status:  Discontinued     1,000 mg 200 mL/hr over 60 Minutes Intravenous  Once 07/05/19 0607 07/05/19 0621   07/05/19 0615  piperacillin-tazobactam (ZOSYN) IVPB 3.375 g     3.375 g 12.5 mL/hr over 240 Minutes Intravenous  Once 07/05/19 0630 07/05/19 1154       Assessment/Plan -Hx GERD/nausea/vomiting at home - Severe malnutrition - Prealbumin 6.0>>7.3. On TPN - Chest tightness episode 4/28 - resolved  S/p Laparoscopic Appendectomy for perforated appendicitis (contained) - Dr. Maisie Fus 4/18- 07/04/19-Readmitted4/20/21 Post-Op Intra-abdominal fluid collection - s/p IR drain of RLQ abscess 4/23. Drain per IR - CT scan 4/28 with resolution of RLQ abscess. Decreased size of pelvic abscess and a rectrocecal fluid collection measuring 7.3x2.5x2.4cm. - s/p IR drain of rectrocecal fluid collection 4/29 - Continue IV abx.Cx w/ E. Coli  - Mobilize, IS  FEN -1/2 TPN. Reg VTE -SCDs, Lovenox  ID -Vanc2/20-4/26;Zosyn4/20 >> (day 11) WBC pending Foley - None Follow-Up: Dr. Maisie Fus  LOS: 10 days    Jillyn Ledger , [facility] 07/15/2019, 8:16 AM Please see Amion for pager number during day hours 7:00am-4:30pm

## 2019-07-15 NOTE — Progress Notes (Addendum)
Referring Physician(s): Dr. Magnus Ivan  Supervising Physician: Irish Lack  Patient Status:  [facility] - In-pt  Chief Complaint: Intra-abdominal abscess  Subjective: No complaints today. Patient had a second abdominal abscess drain placed yesterday afternoon by Dr. Archer Asa. Patient reports no nausea/vomiting, he is ambulating in the room/hallway without difficulty and he is on a regular diet now. He is has received no PRN pain medicines in the last 24 hours. Patient's girlfriend is at the bedside and will assist the patient with his home care needs upon discharge. Patient is still receiving TPN.   Allergies: Patient has no known allergies.  Medications: Prior to Admission medications   Medication Sig Start Date End Date Taking? Authorizing Provider  acetaminophen (TYLENOL) 500 MG tablet Take 2 tablets (1,000 mg total) by mouth every 6 (six) hours. 07/04/19  Yes Simaan, Elizabeth S, PA-C  DEXILANT 60 MG capsule Take 1 capsule by mouth daily. 07/03/19  Yes [provider]  ondansetron (ZOFRAN) 4 MG tablet Take 4 mg by mouth every 6 (six) hours as needed. 07/04/19  Yes [provider]  oxyCODONE (OXY IR/ROXICODONE) 5 MG immediate release tablet Take 1 tablet (5 mg total) by mouth every 6 (six) hours as needed for severe pain (not relieved by tylenol or ibuprofen). 07/04/19  Yes Adam Phenix, PA-C     Vital Signs: BP 131/75 (BP Location: Left Arm)   Pulse 93   Temp 98.3 F (36.8 C) (Oral)   Resp 18   Ht 5\' 10"  (1.778 m)   Wt 220 lb (99.8 kg)   SpO2 97%   BMI 31.57 kg/m   Physical Exam Patient is alert and oriented, denies pain or discomfort. Abdomen is soft with only minimal tenderness in the right lower quadrant. Two JP drains in place, both with dressings that are clean, dry and intact. JP drain #1 had approximately 5 ml of fluid in bulb. Drain #2 had approximately 15 ml fluid in bulb.  Imaging: CT ABDOMEN PELVIS W CONTRAST  Result Date:  07/13/2019 CLINICAL DATA:  Abdominal abscess suspected, IR drain placement last week for abscess status post perforated appendicitis and appendectomy, ongoing nausea EXAM: CT ABDOMEN AND PELVIS WITH CONTRAST TECHNIQUE: Multidetector CT imaging of the abdomen and pelvis was performed using the standard protocol following bolus administration of intravenous contrast. CONTRAST:  07/15/2019 OMNIPAQUE IOHEXOL 350 MG/ML SOLN COMPARISON:  07/08/2019 FINDINGS: Lower chest: Dependent bibasilar atelectasis. Hepatobiliary: No solid liver abnormality is seen. No gallstones, gallbladder wall thickening, or biliary dilatation. Pancreas: Unremarkable. No pancreatic ductal dilatation or surrounding inflammatory changes. Spleen: Normal in size without significant abnormality. Adrenals/Urinary Tract: Adrenal glands are unremarkable. Kidneys are normal, without renal calculi, solid lesion, or hydronephrosis. Bladder is unremarkable. Stomach/Bowel: Stomach is within normal limits. Appendix is surgically absent. Inflammatory thickening and stranding of the bowel and mesentery in the vicinity of previously noted abscess in the right lower quadrant and in the low pelvis. Vascular/Lymphatic: No significant vascular findings are present. No enlarged abdominal or pelvic lymph nodes. Reproductive: No mass or other significant abnormality. Other: No abdominal wall hernia or abnormality. There is a midline approach percutaneous pigtail drainage catheter positioned in the right lower quadrant with formed pigtail; a previously noted air and fluid collection at this site is fully resolved (series 3, image 70). There is a persistent, and slightly enlarged retrocecal air and fluid collection, measuring 7.3 x 2.5 x 2.4 cm (series 3, image 52, series 7, image 31). There is a persistent fluid collection in the low  pelvis, slightly decreased in size measuring 3.9 x 2.2 x 2.4 cm (series 3, image 82, series 7, image 64). Musculoskeletal: No acute or  significant osseous findings. IMPRESSION: 1. There is a midline approach percutaneous pigtail drainage catheter positioned in the right lower quadrant with formed pigtail; a previously noted air and fluid collection at this site is fully resolved. 2. There is a persistent, and slightly enlarged retrocecal air and fluid collection, measuring 7.3 x 2.5 x 2.4 cm, consistent with abscess. 3. There is a persistent fluid collection in the low pelvis, slightly decreased in size measuring 3.9 x 2.2 x 2.4 cm, which may reflect loculated fluid or abscess. 4.  Status post appendectomy. Electronically Signed   By: Eddie Candle M.D.   On: 07/13/2019 09:04   DG CHEST PORT 1 VIEW  Result Date: 07/13/2019 CLINICAL DATA:  Shortness of breath. EXAM: PORTABLE CHEST 1 VIEW COMPARISON:  Radiograph 07/04/2019. FINDINGS: Low lung volumes. Linear perihilar opacities, right greater than left, likely atelectasis. No confluent airspace disease. No significant pleural fluid. No pneumothorax. Tip of the right upper extremity PICC is in the mid SVC. IMPRESSION: Low lung volumes with perihilar atelectasis. Electronically Signed   By: Keith Rake M.D.   On: 07/13/2019 16:48   CT IMAGE GUIDED FLUID DRAIN BY CATHETER  Result Date: 07/14/2019 INDICATION: 29 year old male with a history of appendicitis status post laparoscopic appendectomy complicated by postoperative abscess formation. He underwent placement of a CT-guided drain on 07/08/2019. Follow-up CT imaging demonstrates a new developing abscess collection along the right pericolic gutter. EXAM: CT-guided drain placement MEDICATIONS: The patient is currently [facility] to the [facility] and receiving intravenous antibiotics. The antibiotics were administered within an appropriate time frame prior to the initiation of the procedure. ANESTHESIA/SEDATION: Fentanyl 150 mcg IV; Versed 3 mg IV Moderate Sedation Time:  15 minutes The patient was continuously monitored during the procedure by  the interventional radiology nurse under my direct supervision. COMPLICATIONS: None immediate. PROCEDURE: Informed written consent was obtained from the patient after a thorough discussion of the procedural risks, benefits and alternatives. All questions were addressed. Maximal Sterile Barrier Technique was utilized including caps, mask, sterile gowns, sterile gloves, sterile drape, hand hygiene and skin antiseptic. A timeout was performed prior to the initiation of the procedure. A planning axial CT scan was performed. The fluid and gas collection in the right lower quadrant was successfully identified. A suitable skin entry site was selected and marked. The overlying skin was sterilely prepped and draped in the standard fashion using chlorhexidine skin prep. Local anesthesia was attained by infiltration with 1% lidocaine. A small dermatotomy was made. Under intermittent CT guidance, an 18 gauge trocar needle was carefully advanced into the fluid and gas collection. An Amplatz wire was then coiled in the collection. The needle was removed. The skin tract was dilated to 10 [place]. A Cook 10 [place] all-purpose drainage catheter was advanced over the wire and formed. Aspiration yields 10 mL of thick purulent fluid. A simple the fluid was sent for Gram stain and culture. The drain was gently flushed and then secured to the skin with 0 Prolene suture and an adhesive fixation device. The patient tolerated the procedure well. IMPRESSION: Successful placement of a 10 French drain into the right pericolic gutter abscess yielding 10 mL of thick, purulent fluid. Signed, Criselda Peaches, MD, [place] [facility] Electronically Signed   By: Jacqulynn Cadet M.D.   On: 07/14/2019 17:51    Labs:  CBC: Recent Labs    07/12/19 0458 07/13/19 1256 07/14/19 0412 07/15/19 0904  WBC 11.9* 13.1* 12.8* 8.8  HGB 13.8 14.5 14.4 13.7  HCT 41.1 42.9 43.0 41.0  PLT 281  320 342 324    COAGS: Recent Labs    07/03/19 1126 07/04/19 2229  INR 1.1 1.4*  APTT 28  --     BMP: Recent Labs    07/10/19 0410 07/11/19 0359 07/14/19 0412 07/15/19 0451  NA 138 136 134* 136  K 3.6 4.0 4.2 4.2  CL 109 105 98 101  CO2 22 24 25 26   GLUCOSE 126* 120* 128* 132*  BUN 8 8 17 15   CALCIUM 7.9* 8.3* 8.9 8.9  CREATININE 0.73 0.70 0.85 0.85  GFRNONAA >60 >60 >60 >60  GFRAA >60 >60 >60 >60    LIVER FUNCTION TESTS: Recent Labs    07/09/19 0237 07/10/19 0410 07/11/19 0359 07/14/19 0412  BILITOT 0.4 0.7 0.6 0.6  AST 131* 57* 36 50*  ALT 80* 58* 49* 67*  ALKPHOS 66 60 72 118  PROT 6.0* 5.6* 6.6 7.8  ALBUMIN 2.1* 1.9* 2.2* 2.7*    Assessment and Plan: Intra-abdominal fluid collection, status post drain placements by Dr. 07/13/19 on 07/08/19 and 07/14/19.  Continue flushing drains and monitoring output.   Will follow up with patient on Monday if still inpatient.   If patient is discharged this weekend he will need to follow up with [facility] outpatient in 10-14 days. Follow up appointment orders already placed, drain care information is on the AVS. Patient will need discharge teaching/instructions from bedside RN for home drain care. Patient will need prescription from surgery for NS flushes at discharge.     Electronically Signed: [facility], NP 07/15/2019, 2:50 PM   I spent a total of 15 Minutes at the the patient's bedside AND on the patient's [facility] floor or unit, greater than 50% of which was counseling/coordinating care for intra-abdominal fluid collection/abscess drains.

## 2019-07-15 NOTE — Progress Notes (Signed)
PHARMACY - TOTAL PARENTERAL NUTRITION CONSULT NOTE   Indication: Prolonged ileus  Patient Measurements: Height: 5\' 10"  (177.8 cm) Weight: 99.8 kg (220 lb) IBW/kg (Calculated) : 73 TPN AdjBW (KG): 79.7 Body mass index is 31.57 kg/m.  Assessment:  5 yom presented to the hospital after an appendectomy on 4/18 with fever and vomiting. Now with possible SBO vs paralytic ileus to start TPN for nutrition support.   Glucose / Insulin: CBGs <150, 1 unit SSI given in last 24 hours Electrolytes: Na 136, K 4.2 (goal >/= 4 with ileus)  Mag 2.2 (goal >/= 2 with ileus), Phos 3.6 Renal: WNL LFTs / TGs: LFTs down 57/58, TG 161>180 Prealbumin / albumin: Alb 3.4 > 2.1; prealbumin 6>7.3 Intake / Output; MIVF: drain output 45 mL, urine x 1, LBM 4/29 GI Imaging: 4/18 CT abd - acute appendicitis 4/20 CT abd - gas and fluid collection at the operative site compatible with a developing abscess; Wall thickening is present in the terminal ileum with developing ileus 4/23 CT abd - multiple possible abscesses; partial small bowel obstruction versus paralytic ileus secondary to inflammation 4/28 CT abd - fluid beside cecum, likely abscess  Surgeries / Procedures:  4/18 Appendectomy 4/23 s/p RLQ JP drain, 45 ml thick foul smelling purulent fluid 4/29 CT drain placed  Central access: Double lumen PICC placed 4/24 TPN start date: 4/24 >>  Nutritional Goals (per RD recommendation on 4/28): kCal: 2400-2600, Protein: 115-135, Fluid: >=2L Goal TPN rate is 169mL/hr (provides 126 g of protein and 2577 kcals per day)  Current Nutrition:  Regular diet  TPN   Plan:  Reduce TPN to 67ml/hr now with plans to stop TPN altogether at 6pm Electrolytes in TPN: standard concentration, except increase Na and remove phos for now; Cl:Ac 1:1 Continue MVI, trace elements TPN DC SSI  45m, PharmD, BCPS Clinical Pharmacist Please see AMION for all pharmacy numbers 07/15/2019 8:16 AM

## 2019-07-15 NOTE — TOC Initial Note (Signed)
Transition of Care [facility]) - Initial/Assessment Note    Patient Details  Name: Matthew [facility] MRN: [id number] Date of Birth: [birth year]  Transition of Care [facility]) CM/SW Contact:    Kingsley Plan, RN Phone Number: 07/15/2019, 4:02 PM  Clinical Narrative:                 Patient from home. Bedside nurse has taught girlfriend drain care including flushes Patient will need a prescription for flushes at discharge   Expected Discharge Plan: Home/Self Care Barriers to Discharge: Continued Medical Work up   Patient Goals and CMS Choice Patient states their goals for this hospitalization and ongoing recovery are:: to go home CMS Medicare.gov Compare Post Acute Care list provided to:: Patient Choice offered to / list presented to : Patient  Expected Discharge Plan and Services Expected Discharge Plan: Home/Self Care       Living arrangements for the past 2 months: Single Family Home                 DME Arranged: N/A         HH Arranged: NA          Prior Living Arrangements/Services Living arrangements for the past 2 months: Single Family Home Lives with:: Significant Other Patient language and need for interpreter reviewed:: Yes Do you feel safe going back to the place where you live?: Yes      Need for Family Participation in Patient Care: Yes (Comment) Care giver support system in place?: Yes (comment)   Criminal Activity/Legal Involvement Pertinent to Current Situation/Hospitalization: No - Comment as needed  Activities of Daily Living Home Assistive Devices/Equipment: None ADL Screening (condition at time of admission) Patient's cognitive ability adequate to safely complete daily activities?: Yes Is the patient deaf or have difficulty hearing?: No Does the patient have difficulty seeing, even when wearing glasses/contacts?: No Does the patient have difficulty concentrating, remembering, or making decisions?: No Patient able to express need for assistance with  ADLs?: Yes Does the patient have difficulty dressing or bathing?: No Independently performs ADLs?: Yes (appropriate for developmental age) Does the patient have difficulty walking or climbing stairs?: No Weakness of Legs: Both Weakness of Arms/Hands: None  Permission Sought/Granted   Permission granted to share information with : No              Emotional Assessment   Attitude/Demeanor/Rapport: Engaged Affect (typically observed): Accepting Orientation: : Oriented to Place, Oriented to  Time, Oriented to Situation, Oriented to Self Alcohol / Substance Use: Not Applicable Psych Involvement: No (comment)  Admission diagnosis:  Generalized abdominal pain [R10.84] Intra-abdominal abscess (HCC) [K65.1] Intraabdominal fluid collection [R18.8] Fever, unspecified fever cause [R50.9] Patient Active Problem List   Diagnosis Date Noted  . Intraabdominal fluid collection 07/05/2019  . Perforated appendicitis 07/03/2019   PCP:  Deatra James, MD Pharmacy:   [facility] [address], [place] - [address] [place] [address] Phone: [phone] Fax: [phone]     Social Determinants of Health (SDOH) Interventions    Readmission Risk Interventions No flowsheet data found.

## 2019-07-16 LAB — CBC
HCT: 39 % (ref 39.0–52.0)
Hemoglobin: 12.9 g/dL — ABNORMAL LOW (ref 13.0–17.0)
MCH: 30 pg (ref 26.0–34.0)
MCHC: 33.1 g/dL (ref 30.0–36.0)
MCV: 90.7 fL (ref 80.0–100.0)
Platelets: 368 10*3/uL (ref 150–400)
RBC: 4.3 MIL/uL (ref 4.22–5.81)
RDW: 12 % (ref 11.5–15.5)
WBC: 9.5 10*3/uL (ref 4.0–10.5)
nRBC: 0 % (ref 0.0–0.2)

## 2019-07-16 LAB — GLUCOSE, CAPILLARY: Glucose-Capillary: 93 mg/dL (ref 70–99)

## 2019-07-16 MED ORDER — SULFAMETHOXAZOLE-TRIMETHOPRIM 800-160 MG PO TABS
1.0000 | ORAL_TABLET | Freq: Two times a day (BID) | ORAL | 1 refills | Status: DC
Start: 2019-07-16 — End: 2020-02-23

## 2019-07-16 NOTE — Progress Notes (Signed)
Subjective/Chief Complaint: tol diet, having bowel function, wbc normal, no fevers   Objective: Vital signs in last 24 hours: Temp:  [97.5 F (36.4 C)-98.7 F (37.1 C)] 97.5 F (36.4 C) (05/01 0808) Pulse Rate:  [79-96] 79 (05/01 0808) Resp:  [16-20] 20 (05/01 0808) BP: (117-133)/(68-83) 133/83 (05/01 0808) SpO2:  [98 %-100 %] 98 % (05/01 0808) Last BM Date: 07/14/19  Intake/Output from previous day: 04/30 0701 - 05/01 0700 In: 1665 [P.O.:480; I.V.:1155] Out: 28 [Urine:3; Drains:25] Intake/Output this shift: Total I/O In: 680 [P.O.:680] Out: -   Gi soft nontender good bs drain functional   Lab Results:  Recent Labs    07/15/19 0904 07/16/19 0419  WBC 8.8 9.5  HGB 13.7 12.9*  HCT 41.0 39.0  PLT 324 368   BMET Recent Labs    07/14/19 0412 07/15/19 0451  NA 134* 136  K 4.2 4.2  CL 98 101  CO2 25 26  GLUCOSE 128* 132*  BUN 17 15  CREATININE 0.85 0.85  CALCIUM 8.9 8.9   PT/INR No results for input(s): LABPROT, INR in the last 72 hours. ABG No results for input(s): PHART, HCO3 in the last 72 hours.  Invalid input(s): PCO2, PO2  Studies/Results: CT IMAGE GUIDED FLUID DRAIN BY CATHETER  Result Date: 07/14/2019 INDICATION: 29 year old male with a history of appendicitis status post laparoscopic appendectomy complicated by postoperative abscess formation. He underwent placement of a CT-guided drain on 07/08/2019. Follow-up CT imaging demonstrates a new developing abscess collection along the right pericolic gutter. EXAM: CT-guided drain placement MEDICATIONS: The patient is currently admitted to the hospital and receiving intravenous antibiotics. The antibiotics were administered within an appropriate time frame prior to the initiation of the procedure. ANESTHESIA/SEDATION: Fentanyl 150 mcg IV; Versed 3 mg IV Moderate Sedation Time:  15 minutes The patient was continuously monitored during the procedure by the interventional radiology nurse under my direct  supervision. COMPLICATIONS: None immediate. PROCEDURE: Informed written consent was obtained from the patient after a thorough discussion of the procedural risks, benefits and alternatives. All questions were addressed. Maximal Sterile Barrier Technique was utilized including caps, mask, sterile gowns, sterile gloves, sterile drape, hand hygiene and skin antiseptic. A timeout was performed prior to the initiation of the procedure. A planning axial CT scan was performed. The fluid and gas collection in the right lower quadrant was successfully identified. A suitable skin entry site was selected and marked. The overlying skin was sterilely prepped and draped in the standard fashion using chlorhexidine skin prep. Local anesthesia was attained by infiltration with 1% lidocaine. A small dermatotomy was made. Under intermittent CT guidance, an 18 gauge trocar needle was carefully advanced into the fluid and gas collection. An Amplatz wire was then coiled in the collection. The needle was removed. The skin tract was dilated to 10 [place]. A Cook 10 [place] all-purpose drainage catheter was advanced over the wire and formed. Aspiration yields 10 mL of thick purulent fluid. A simple the fluid was sent for Gram stain and culture. The drain was gently flushed and then secured to the skin with 0 Prolene suture and an adhesive fixation device. The patient tolerated the procedure well. IMPRESSION: Successful placement of a 10 French drain into the right pericolic gutter abscess yielding 10 mL of thick, purulent fluid. Signed, Sterling Big, MD, [facility] Electronically Signed   By: Malachy Moan M.D.   On: 07/14/2019 17:51    Anti-infectives: Anti-infectives (From admission, onward)  Start     Dose/Rate Route Frequency Ordered Stop   07/10/19 1200  vancomycin (VANCOREADY) IVPB 1750 mg/350 mL  Status:  Discontinued     1,750 mg 175 mL/hr over 120  Minutes Intravenous Every 12 hours 07/10/19 1103 07/11/19 0800   07/05/19 2200  vancomycin (VANCOREADY) IVPB 1750 mg/350 mL  Status:  Discontinued     1,750 mg 175 mL/hr over 120 Minutes Intravenous Every 12 hours 07/05/19 2149 07/10/19 1103   07/05/19 1800  vancomycin (VANCOREADY) IVPB 1750 mg/350 mL  Status:  Discontinued     1,750 mg 175 mL/hr over 120 Minutes Intravenous Every 12 hours 07/05/19 0635 07/05/19 0745   07/05/19 1500  piperacillin-tazobactam (ZOSYN) IVPB 3.375 g     3.375 g 12.5 mL/hr over 240 Minutes Intravenous Every 8 hours 07/05/19 0635     07/05/19 0630  vancomycin (VANCOREADY) IVPB 2000 mg/400 mL  Status:  Discontinued     2,000 mg 200 mL/hr over 120 Minutes Intravenous  Once 07/05/19 0621 07/05/19 0805   07/05/19 0615  vancomycin (VANCOCIN) IVPB 1000 mg/200 mL premix  Status:  Discontinued     1,000 mg 200 mL/hr over 60 Minutes Intravenous  Once 07/05/19 0607 07/05/19 0621   07/05/19 0615  piperacillin-tazobactam (ZOSYN) IVPB 3.375 g     3.375 g 12.5 mL/hr over 240 Minutes Intravenous  Once 07/05/19 8841 07/05/19 1154      Assessment/Plan: Hx GERD/nausea/vomiting at home - Severe malnutrition - Prealbumin 6.0>>7.3. On TPN - Chest tightness episode 4/28 - resolved  S/p Laparoscopic Appendectomy for perforated appendicitis (contained) - Dr. Marcello Moores 4/18- 07/04/19-Readmitted4/20/21 Post-Op Intra-abdominal fluid collection - s/p IR drain of RLQ abscess 4/23. Drain per IR - CT scan4/28with resolution of RLQ abscess. Decreased size of pelvic abscess and a rectrocecal fluid collection measuring 7.3x2.5x2.4cm. - s/p IR drain of rectrocecal fluid collection 4/29 - home today on bactrim - [employer], IS  FEN -1/2 TPN. Reg VTE -SCDs, Lovenox ID -Vanc2/20-4/26;Zosyn4/20 >>(day 11) WBC normal Foley - None Follow-Up: Dr. Evelena Asa 07/16/2019

## 2019-07-16 NOTE — Progress Notes (Signed)
Discharged home today accompanied by patient's girlfriend. Personal belongings,Discharged instructions given . Advised to pick up medications called in to pharmacy of choice.verbalized understanding of instructions

## 2019-07-18 ENCOUNTER — Other Ambulatory Visit: Payer: Self-pay | Admitting: General Surgery

## 2019-07-18 DIAGNOSIS — K651 Peritoneal abscess: Secondary | ICD-10-CM

## 2019-07-18 LAB — AEROBIC/ANAEROBIC CULTURE W GRAM STAIN (SURGICAL/DEEP WOUND)

## 2019-07-22 NOTE — Discharge Summary (Signed)
Patient ID: Matthew Pace [id number] 15-Apr-1990 29 y.o.  Admit date: 07/04/2019 Discharge date: 07/16/2019  Admitting Diagnosis: S/p Laparoscopic Appendectomy for perforated appendicitis (contained) - Dr. Maisie Fus 4/18  Post-Op Intra-abdominal fluid collection Developing reactive ileus  Discharge Diagnosis Patient Active Problem List   Diagnosis Date Noted  . Intraabdominal fluid collection 07/05/2019  . Perforated appendicitis 07/03/2019    Consultants Gastroenterology  Interventional Radiology   Reason for Admission: Matthew Pace is a 29 y.o. male who was admitted on 4/18 with acute appendicitis.  He was taken to the operating room on 4/18 by Dr. Maisie Fus where he was found to have perforated appendicitis (contained) and underwent laparoscopic appendectomy.  Patient was doing well and was discharged home on 4/19 with 5 days of Augmentin.  He reports after arriving home he began having worsening right lower quadrant abdominal pain as well as constant nausea, 2 episodes of nonbilious, nonbloody emesis and a fever to 102.  He reports he was not able to hold any liquids or medications down so he presented to the ED.  There he was found to have fever of 102.4 and elevated heart rate to 138.  White blood cell count uptrending to 17.9 from 15.5. CT showed  3.1 x 3.0 x 2.2 cm gas and fluid collection at the operative site compatible with a developing abscess as well as a developing ileus. He is passing flatus. No BM since 4/18. He is no longer nauseated after getting zofran.   Procedures Dr. Sterling Big - IR drain placement - 4/23  Dr. Sterling Big - IR drain placement - 4/29  Hospital Course:  Patient was readmitted to the general surgery service for above findings. He was placed on abx and kept NPO. Patient continued to have leukocytosis and was febrile despite abx, so a repeat CT scan was performed that showed intra-abdominal fluid collection that was amenable to IR  drainage. IR placed drain on 4/23 as noted above. Patient suffered from an ileus during hospital stay and required TPN. GI was consulted for this and recommended outpatient follow up. A repeat CT scan was performed on 4/28 due to continued intermittent nausea, and leukocytosis. This showed enlarging intra-abdomianl fluid collection that was amenable to IR drainage. IR placed second drain on 4/29 as noted above. Patient tolerated the procedure well. His ileus improved, TPN was weaned and diet was advanced and tolerated. Abx were narrowed per IR Cx's. On 5/1 the patient was voiding well, tolerating diet, ambulating well, pain well controlled, vital signs stable, incisions c/d/i, drains intact and felt stable for discharge home. Patient was sent home on Bactrim with follow up in the office. Patient also to follow up in drain clinic with IR.   Physical Exam: Please see MD's note from day of discharge  Allergies as of 07/16/2019   No Known Allergies     Medication List    STOP taking these medications   amoxicillin-clavulanate 875-125 MG tablet Commonly known as: Augmentin     TAKE these medications   acetaminophen 500 MG tablet Commonly known as: TYLENOL Take 2 tablets (1,000 mg total) by mouth every 6 (six) hours.   Dexilant 60 MG capsule Generic drug: dexlansoprazole Take 1 capsule by mouth daily.   ondansetron 4 MG tablet Commonly known as: ZOFRAN Take 4 mg by mouth every 6 (six) hours as needed.   oxyCODONE 5 MG immediate release tablet Commonly known as: Oxy IR/ROXICODONE Take 1 tablet (5 mg total) by mouth every 6 (six)  hours as needed for severe pain (not relieved by tylenol or ibuprofen).   sulfamethoxazole-trimethoprim 800-160 MG tablet Commonly known as: BACTRIM DS Take 1 tablet by mouth 2 (two) times daily.        Follow-up Information    Leighton Ruff, MD. Go on 3/50/0938.   Specialty: General Surgery Why: @10am . Please arrive to your appointment 30 minutes early  for paperwork. Please bring a copy of your photo ID and insurance card to the appointment.  Contact information: [place] [address] [phone]        [place]. Schedule an appointment as soon as possible for a visit.   Why: Please call to schedule a follow up appointment between 10-14 days from the time of discharge.  Contact information: [place] [address] [phone]        Jacqulynn Cadet, MD Follow up.   Specialties: Interventional Radiology, Radiology Why: Patient needs to see Dr. Laurence Ferrari with [facility] Radiology 10-14 days after discharge hospital. A scheduler will call the patient to set up appt. Please call [facility] Radiology if there are any questions or concerns prior to appointment. Contact information: [address] [place] [address] [phone]           Signed: Alferd Apa, [facility] Surgery 07/22/2019, 3:19 PM Please see Amion for pager number during day hours 7:00am-4:30pm

## 2019-07-27 ENCOUNTER — Ambulatory Visit
Admission: RE | Admit: 2019-07-27 | Discharge: 2019-07-27 | Disposition: A | Discharge status: Home / Self Care | Payer: BC Managed Care – PPO | Source: Ambulatory Visit | Attending: General Surgery | Admitting: General Surgery

## 2019-07-27 ENCOUNTER — Other Ambulatory Visit: Payer: Self-pay

## 2019-07-27 ENCOUNTER — Encounter: Payer: Self-pay | Admitting: Radiology

## 2019-07-27 ENCOUNTER — Ambulatory Visit
Admission: RE | Admit: 2019-07-27 | Discharge: 2019-07-27 | Disposition: A | Discharge status: Home / Self Care | Payer: BC Managed Care – PPO | Source: Ambulatory Visit | Attending: Student | Admitting: Student

## 2019-07-27 DIAGNOSIS — K651 Peritoneal abscess: Secondary | ICD-10-CM

## 2019-07-27 HISTORY — PX: IR RADIOLOGIST EVAL & MGMT: IMG5224

## 2019-07-27 MED ORDER — IOPAMIDOL (ISOVUE-300) INJECTION 61%
100.0000 mL | Freq: Once | INTRAVENOUS | Status: AC | PRN
  Administered 2019-07-27: 100 mL via INTRAVENOUS

## 2019-07-27 NOTE — Progress Notes (Signed)
Referring Physician(s): Dr. Grandville Silos  Chief Complaint: The patient is seen in follow up today s/p intra-abdominal abscesses after appendectomy  History of present illness: Matthew Pace is a 29 y.o. male who underwent lap appy for perf appendicitis on 4/18. Was discharged on 4/19 but had to return same day for ongoing fever, pain, and N/V.  Initial imaging showed an intra-abdominal fluid collection suspicious for abscess.  He underwent drain placement 4/23 with Dr. Laurence Ferrari.  Several days later, he developed persistent WBC and repeat imaging showed a second intra-abdominal fluid collection.  A second drain was placed by Dr. Laurence Ferrari 07/14/19.  He was ultimately discharged home on PO antibiotics and returns to IR drain clinic today for routine drain follow-up.   Matthew Pace is assessed today and found to be much improved from his last assessment in the hospital.  He is feeling much better and denies fever, chills, abdominal pain, nausea, constipation, diarrhea.  He completes antibiotics today.  He has been able to advance his diet with tolerance.  He reports minimal drainage from his drains.  He has continued to flush, but reports this has been difficult lately due to reflux of saline around the tubing. He is hopeful for drain removal today.    Past Medical History:  Diagnosis Date  . GERD (gastroesophageal reflux disease)     Past Surgical History:  Procedure Laterality Date  . IR RADIOLOGIST EVAL & MGMT  07/27/2019  . LAPAROSCOPIC APPENDECTOMY N/A 07/03/2019   Procedure: APPENDECTOMY LAPAROSCOPIC;  Surgeon: Leighton Ruff, MD;  Location: [facility] ORS;  Service: General;  Laterality: N/A;    Allergies: Patient has no known allergies.  Medications: Prior to Admission medications   Medication Sig Start Date End Date Taking? Authorizing Provider  acetaminophen (TYLENOL) 500 MG tablet Take 2 tablets (1,000 mg total) by mouth every 6 (six) hours. 07/04/19   Simaan, Darci Current, PA-C  DEXILANT  60 MG capsule Take 1 capsule by mouth daily. 07/03/19   [provider]  ondansetron (ZOFRAN) 4 MG tablet Take 4 mg by mouth every 6 (six) hours as needed. 07/04/19   [provider]  oxyCODONE (OXY IR/ROXICODONE) 5 MG immediate release tablet Take 1 tablet (5 mg total) by mouth every 6 (six) hours as needed for severe pain (not relieved by tylenol or ibuprofen). 07/04/19   Jill Alexanders, PA-C  sulfamethoxazole-trimethoprim (BACTRIM DS) 800-160 MG tablet Take 1 tablet by mouth 2 (two) times daily. 07/16/19   Rolm Bookbinder, MD     Family History  Problem Relation Age of Onset  . Diabetes Mother   . Hypertension Mother   . Diabetes Maternal Grandmother   . Stroke Maternal Grandmother   . Cancer Maternal Grandfather   . Heart disease Paternal Grandfather   . Stroke Paternal Grandfather     Social History   Socioeconomic History  . Marital status: Single    Spouse name: Not on file  . Number of children: Not on file  . Years of education: Not on file  . Highest education level: Not on file  Occupational History  . Not on file  Tobacco Use  . Smoking status: Former Smoker    Packs/day: 1.00    Types: Cigarettes    Quit date: 12/31/2015    Years since quitting: 3.5  . Smokeless tobacco: Never Used  Substance and Sexual Activity  . Alcohol use: Yes    Alcohol/week: 7.0 standard drinks    Types: 7 Standard drinks or equivalent per week  .  Drug use: No  . Sexual activity: Not on file  Other Topics Concern  . Not on file  Social History Narrative  . Not on file   Social Determinants of Health   Financial Resource Strain:   . Difficulty of Paying Living Expenses:   Food Insecurity:   . Worried About Programme researcher, broadcasting/film/video in the Last Year:   . Barista in the Last Year:   Transportation Needs:   . Freight forwarder (Medical):   Marland Kitchen Lack of Transportation (Non-Medical):   Physical Activity:   . Days of Exercise per Week:   . Minutes of  Exercise per Session:   Stress:   . Feeling of Stress :   Social Connections:   . Frequency of Communication with Friends and Family:   . Frequency of Social Gatherings with Friends and Family:   . Attends Religious Services:   . Active Member of Clubs or Organizations:   . Attends Banker Meetings:   Marland Kitchen Marital Status:      Vital Signs: BP 140/80   Temp 98.4 F (36.9 C)   SpO2 100%   Physical Exam  NAD, alert Abdomen: soft, non-tender.  R lateral drain in place.  Insertion site c/d/i.  Anterior lower abdominal/pelvic drain in place.  Insertion site c/d/i.  Minimal thin output from both drains.   Imaging: IR Radiologist Eval & Mgmt  Result Date: 07/27/2019 Please refer to notes tab for details about interventional procedure. (Op Note)   Labs:  CBC: Recent Labs    07/13/19 1256 07/14/19 0412 07/15/19 0904 07/16/19 0419  WBC 13.1* 12.8* 8.8 9.5  HGB 14.5 14.4 13.7 12.9*  HCT 42.9 43.0 41.0 39.0  PLT 320 342 324 368    COAGS: Recent Labs    07/03/19 1126 07/04/19 2229  INR 1.1 1.4*  APTT 28  --     BMP: Recent Labs    07/10/19 0410 07/11/19 0359 07/14/19 0412 07/15/19 0451  NA 138 136 134* 136  K 3.6 4.0 4.2 4.2  CL 109 105 98 101  CO2 22 24 25 26   GLUCOSE 126* 120* 128* 132*  BUN 8 8 17 15   CALCIUM 7.9* 8.3* 8.9 8.9  CREATININE 0.73 0.70 0.85 0.85  GFRNONAA >60 >60 >60 >60  GFRAA >60 >60 >60 >60    LIVER FUNCTION TESTS: Recent Labs    07/09/19 0237 07/10/19 0410 07/11/19 0359 07/14/19 0412  BILITOT 0.4 0.7 0.6 0.6  AST 131* 57* 36 50*  ALT 80* 58* 49* 67*  ALKPHOS 66 60 72 118  PROT 6.0* 5.6* 6.6 7.8  ALBUMIN 2.1* 1.9* 2.2* 2.7*    Assessment: Intra-abdominal fluid collection, status post drain placements by Dr. 07/13/19 on 07/08/19 and 07/14/19.  Patient presents to IR drain clinic today for routine follow-up of his drains.   He has been doing well at home since discharge.  He has been able to advance his diet with  tolerance, is having normal stools, no abdominal pain. He reports minimal output from his drain. His last dose of antibiotics is today.  He has surgical follow-up scheduled for 5/24.  CT Abdomen Pelvis reviewed by Dr. 07/16/19 today who recommends drain removal.   Drains removed in their entirety without complication.  Dressings placed.  Care instructions given.  Patient encouraged to keep follow-up with surgery.  No scheduled follow-up needed in IR.  Signed: 6/24, PA 07/27/2019, 3:43 PM   Please refer to Dr. Hoyt Koch  attestation of this note for management and plan.

## 2019-08-02 ENCOUNTER — Other Ambulatory Visit: Payer: BC Managed Care – PPO

## 2019-08-05 ENCOUNTER — Emergency Department (HOSPITAL_COMMUNITY)
Admission: EM | Admit: 2019-08-05 | Discharge: 2019-08-05 | Disposition: A | Discharge status: Home / Self Care | Payer: BC Managed Care – PPO | Attending: Emergency Medicine | Admitting: Emergency Medicine

## 2019-08-05 ENCOUNTER — Emergency Department (HOSPITAL_COMMUNITY): Payer: BC Managed Care – PPO

## 2019-08-05 ENCOUNTER — Encounter (HOSPITAL_COMMUNITY): Payer: Self-pay | Admitting: Emergency Medicine

## 2019-08-05 ENCOUNTER — Other Ambulatory Visit: Payer: Self-pay

## 2019-08-05 DIAGNOSIS — Z87891 Personal history of nicotine dependence: Secondary | ICD-10-CM | POA: Insufficient documentation

## 2019-08-05 DIAGNOSIS — Z20822 Contact with and (suspected) exposure to covid-19: Secondary | ICD-10-CM | POA: Diagnosis not present

## 2019-08-05 DIAGNOSIS — R1031 Right lower quadrant pain: Secondary | ICD-10-CM | POA: Diagnosis not present

## 2019-08-05 DIAGNOSIS — Z79899 Other long term (current) drug therapy: Secondary | ICD-10-CM | POA: Diagnosis not present

## 2019-08-05 DIAGNOSIS — R110 Nausea: Secondary | ICD-10-CM

## 2019-08-05 DIAGNOSIS — R1033 Periumbilical pain: Secondary | ICD-10-CM

## 2019-08-05 LAB — COMPREHENSIVE METABOLIC PANEL
ALT: 34 U/L (ref 0–44)
AST: 24 U/L (ref 15–41)
Albumin: 3.9 g/dL (ref 3.5–5.0)
Alkaline Phosphatase: 115 U/L (ref 38–126)
Anion gap: 9 (ref 5–15)
BUN: 15 mg/dL (ref 6–20)
CO2: 27 mmol/L (ref 22–32)
Calcium: 9.4 mg/dL (ref 8.9–10.3)
Chloride: 102 mmol/L (ref 98–111)
Creatinine, Ser: 0.99 mg/dL (ref 0.61–1.24)
GFR calc Af Amer: >60 mL/min (ref >60)
GFR calc non Af Amer: >60 mL/min (ref >60)
Glucose, Bld: 105 mg/dL — ABNORMAL HIGH (ref 70–99)
Potassium: 3.8 mmol/L (ref 3.5–5.1)
Sodium: 138 mmol/L (ref 135–145)
Total Bilirubin: 0.9 mg/dL (ref 0.3–1.2)
Total Protein: 7.6 g/dL (ref 6.5–8.1)

## 2019-08-05 LAB — LACTIC ACID, PLASMA: Lactic Acid, Venous: 1.2 mmol/L (ref 0.5–1.9)

## 2019-08-05 LAB — CBC
HCT: 42.4 % (ref 39.0–52.0)
Hemoglobin: 14.2 g/dL (ref 13.0–17.0)
MCH: 30 pg (ref 26.0–34.0)
MCHC: 33.5 g/dL (ref 30.0–36.0)
MCV: 89.6 fL (ref 80.0–100.0)
Platelets: 154 10*3/uL (ref 150–400)
RBC: 4.73 MIL/uL (ref 4.22–5.81)
RDW: 12 % (ref 11.5–15.5)
WBC: 10.3 10*3/uL (ref 4.0–10.5)
nRBC: 0 % (ref 0.0–0.2)

## 2019-08-05 LAB — URINALYSIS, ROUTINE W REFLEX MICROSCOPIC
Bilirubin Urine: NEGATIVE
Glucose, UA: NEGATIVE mg/dL
Hgb urine dipstick: NEGATIVE
Ketones, ur: NEGATIVE mg/dL
Leukocytes,Ua: NEGATIVE
Nitrite: NEGATIVE
Protein, ur: NEGATIVE mg/dL
Specific Gravity, Urine: 1.023 (ref 1.005–1.030)
pH: 8 (ref 5.0–8.0)

## 2019-08-05 LAB — SARS CORONAVIRUS 2 BY RT PCR (HOSPITAL ORDER, PERFORMED IN ~~LOC~~ HOSPITAL LAB): SARS Coronavirus 2: NEGATIVE

## 2019-08-05 LAB — LIPASE, BLOOD: Lipase: 34 U/L (ref 11–51)

## 2019-08-05 MED ORDER — IBUPROFEN 800 MG PO TABS
800.0000 mg | ORAL_TABLET | Freq: Once | ORAL | Status: AC
  Administered 2019-08-05: 800 mg via ORAL
  Filled 2019-08-05: qty 1

## 2019-08-05 MED ORDER — ONDANSETRON HCL 4 MG/2ML IJ SOLN
4.0000 mg | Freq: Once | INTRAMUSCULAR | Status: AC
  Administered 2019-08-05: 4 mg via INTRAVENOUS
  Filled 2019-08-05: qty 2

## 2019-08-05 MED ORDER — LACTATED RINGERS IV BOLUS
1000.0000 mL | Freq: Once | INTRAVENOUS | Status: AC
  Administered 2019-08-05: 1000 mL via INTRAVENOUS

## 2019-08-05 MED ORDER — IOHEXOL 300 MG/ML  SOLN
100.0000 mL | Freq: Once | INTRAMUSCULAR | Status: AC | PRN
  Administered 2019-08-05: 100 mL via INTRAVENOUS

## 2019-08-05 MED ORDER — ACETAMINOPHEN 325 MG PO TABS
650.0000 mg | ORAL_TABLET | Freq: Once | ORAL | Status: AC
  Administered 2019-08-05: 650 mg via ORAL
  Filled 2019-08-05: qty 2

## 2019-08-05 MED ORDER — SODIUM CHLORIDE 0.9% FLUSH
3.0000 mL | Freq: Once | INTRAVENOUS | Status: AC
  Administered 2019-08-05: 3 mL via INTRAVENOUS

## 2019-08-05 NOTE — ED Triage Notes (Signed)
Patient in POV, reports abdominal pain onset this AM. Denies N/V/D. Patient had appendectomy 07/03/19 and has had continued abd pain with fevers since surgery. Called [facility] and sent here for CT abd for r/o abscess.

## 2019-08-05 NOTE — ED Notes (Signed)
Patient verbalizes understanding of discharge instructions. Opportunity for questioning and answers were provided. Armband removed by staff, pt discharged from ED ambulatory to home.  

## 2019-08-05 NOTE — Discharge Instructions (Addendum)
Take Ibuprofen, every 8 hours for the next few days, for relief of pain, inflammation, and fevers.  Keep your follow-up appointment at surgery clinic on Monday.  Return to the ED if your symptoms worsen.

## 2019-08-05 NOTE — ED Provider Notes (Signed)
[facility] EMERGENCY DEPARTMENT Provider Note   CSN: [id number] Arrival date & time: 08/05/19  1525     History Chief Complaint  Patient presents with  . Abdominal Pain    Mariano Doshi is a 29 y.o. male.  HPI Patient is a previously healthy 29 year old male who presents for suspected postoperative complication from appendectomy.  Recent medical history is as follows: He was admitted to the hospital on 4/18 for acute appendicitis.  He underwent laparoscopic appendectomy, where he was found to have a contained perforated appendicitis.  He was discharged on 4/19.  He subsequently developed emesis and fever.  He was found to have a fluid collection at operative site.  He was once again, admitted to the hospital.  He had an IR drain placed on 4/23.  He had an ileus and required TPN.  Subsequent CT scan showed enlarging fluid collection.  Second IR drain was placed on 4/29.  Patient was discharged on 5/1.  At his follow-up appointment on 5/12, IR drains were removed.  He finished his outpatient course of antibiotics (Bactrim) on 5/12 as well.  Since that time, he has had intermittent periumbilical pain, worse with movement.  He has been tolerating a normal diet and having normal bowel movements.  He reports that over the past week, he has had increasing frequency and increasing severity of his periumbilical pain.  Over the past 2 days, he has experienced fatigue.  Today, he had a fever.  He called his surgery office who instructed him to come to the ED.  Upon arrival in the ED, he reports nausea earlier today, without vomiting.  Currently, he does not have any nausea.  He reports mild periumbilical pain that is intermittent and mild right lower quadrant pain that is constant.  Last meal was at 1 PM, at which time he had 2 chicken nuggets.  He had some sips of water in the waiting room.  Last bowel movement was yesterday and was normal.  He has not been taking any analgesic medication  at home.  He has no other medical problems.  He has not had any other infectious symptoms: Cough, dysuria, skin changes.    Past Medical History:  Diagnosis Date  . GERD (gastroesophageal reflux disease)     Patient Active Problem List   Diagnosis Date Noted  . Intraabdominal fluid collection 07/05/2019  . Perforated appendicitis 07/03/2019    Past Surgical History:  Procedure Laterality Date  . IR RADIOLOGIST EVAL & MGMT  07/27/2019  . LAPAROSCOPIC APPENDECTOMY N/A 07/03/2019   Procedure: APPENDECTOMY LAPAROSCOPIC;  Surgeon: Romie Levee, MD;  Location: [facility];  Service: General;  Laterality: N/A;       Family History  Problem Relation Age of Onset  . Diabetes Mother   . Hypertension Mother   . Diabetes Maternal Grandmother   . Stroke Maternal Grandmother   . Cancer Maternal Grandfather   . Heart disease Paternal Grandfather   . Stroke Paternal Grandfather     Social History   Tobacco Use  . Smoking status: Former Smoker    Packs/day: 1.00    Types: Cigarettes    Quit date: 12/31/2015    Years since quitting: 3.6  . Smokeless tobacco: Never Used  Substance Use Topics  . Alcohol use: Yes    Alcohol/week: 7.0 standard drinks    Types: 7 Standard drinks or equivalent per week  . Drug use: No    Home Medications Prior to Admission medications   Medication  Sig Start Date End Date Taking? Authorizing Provider  acetaminophen (TYLENOL) 500 MG tablet Take 2 tablets (1,000 mg total) by mouth every 6 (six) hours. 07/04/19  Yes Simaan, Elizabeth Kathie Rhodes, PA-C  DEXILANT 60 MG capsule Take 60 mg by mouth daily.  07/03/19  Yes [provider]  sulfamethoxazole-trimethoprim (BACTRIM DS) 800-160 MG tablet Take 1 tablet by mouth 2 (two) times daily. Patient taking differently: Take 1 tablet by mouth 2 (two) times daily. Start date :07/17/19 07/16/19  Yes Emelia Loron, MD  ondansetron (ZOFRAN) 4 MG tablet Take 4 mg by mouth every 6 (six) hours as needed. 07/04/19    [provider]  oxyCODONE (OXY IR/ROXICODONE) 5 MG immediate release tablet Take 1 tablet (5 mg total) by mouth every 6 (six) hours as needed for severe pain (not relieved by tylenol or ibuprofen). Patient not taking: Reported on 08/05/2019 07/04/19   Adam Phenix, PA-C    Allergies    Vancomycin  Review of Systems   Review of Systems  Constitutional: Positive for appetite change, fatigue and fever.  HENT: Negative.  Negative for congestion, ear pain, rhinorrhea, sinus pain and sore throat.   Eyes: Negative for pain and visual disturbance.  Respiratory: Negative for cough, chest tightness, shortness of breath and wheezing.   Cardiovascular: Negative for chest pain, palpitations and leg swelling.  Gastrointestinal: Positive for abdominal pain and nausea. Negative for constipation, diarrhea and vomiting.  Genitourinary: Negative for dysuria, flank pain and hematuria.  Musculoskeletal: Negative for arthralgias, back pain, joint swelling, myalgias and neck pain.  Skin: Negative for color change, rash and wound.  Neurological: Negative for dizziness, seizures, syncope, weakness, light-headedness, numbness and headaches.  Hematological: Negative.   Psychiatric/Behavioral: Negative.   All other systems reviewed and are negative.   Physical Exam Updated Vital Signs BP 112/72   Pulse (!) 110   Temp 100.1 F (37.8 C) (Oral)   Resp 20   Ht 5\' 10"  (1.778 m)   Wt 94.3 kg   SpO2 99%   BMI 29.84 kg/m   Physical Exam Vitals and nursing note reviewed.  Constitutional:      General: He is not in acute distress.    Appearance: He is well-developed and normal weight. He is not ill-appearing, toxic-appearing or diaphoretic.  HENT:     Head: Normocephalic and atraumatic.     Mouth/Throat:     Mouth: Mucous membranes are moist.     Pharynx: Oropharynx is clear.  Eyes:     General: No scleral icterus.    Extraocular Movements: Extraocular movements intact.      Conjunctiva/sclera: Conjunctivae normal.  Cardiovascular:     Rate and Rhythm: Regular rhythm. Tachycardia present.     Heart sounds: Normal heart sounds. No murmur.  Pulmonary:     Effort: Pulmonary effort is normal. No respiratory distress.     Breath sounds: Normal breath sounds. No wheezing or rales.  Abdominal:     Palpations: Abdomen is soft.     Tenderness: There is abdominal tenderness in the right lower quadrant and periumbilical area. There is no right CVA tenderness, left CVA tenderness, guarding or rebound.     Hernia: No hernia is present.  Musculoskeletal:     Cervical back: Neck supple.  Skin:    General: Skin is warm and dry.     Capillary Refill: Capillary refill takes less than 2 seconds.  Neurological:     General: No focal deficit present.     Mental Status: He is  alert and oriented to person, place, and time.     Cranial Nerves: No cranial nerve deficit.     Motor: No weakness.  Psychiatric:        Mood and Affect: Mood normal.        Behavior: Behavior normal.     ED Results / Procedures / Treatments   Labs (all labs ordered are listed, but only abnormal results are displayed) Labs Reviewed  COMPREHENSIVE METABOLIC PANEL - Abnormal; Notable for the following components:      Result Value   Glucose, Bld 105 (*)    All other components within normal limits  SARS CORONAVIRUS 2 BY RT PCR (HOSPITAL ORDER, [place] [facility])  CULTURE, BLOOD (ROUTINE X 2)  CULTURE, BLOOD (ROUTINE X 2)  LIPASE, BLOOD  CBC  URINALYSIS, ROUTINE W REFLEX MICROSCOPIC  LACTIC ACID, PLASMA    EKG None  Radiology CT ABDOMEN PELVIS W CONTRAST  Result Date: 08/05/2019 CLINICAL DATA:  History of prior appendectomy with continued pain and fevers EXAM: CT ABDOMEN AND PELVIS WITH CONTRAST TECHNIQUE: Multidetector CT imaging of the abdomen and pelvis was performed using the standard protocol following bolus administration of intravenous contrast. CONTRAST:  131mL  OMNIPAQUE IOHEXOL 300 MG/ML  SOLN COMPARISON:  07/27/2018 FINDINGS: Lower chest: No acute abnormality. Hepatobiliary: Fatty infiltration of the liver is noted. Gallbladder is within normal limits. Pancreas: Unremarkable. No pancreatic ductal dilatation or surrounding inflammatory changes. Spleen: Normal in size without focal abnormality. Adrenals/Urinary Tract: Adrenal glands are within normal limits. Kidneys are well visualized bilaterally within normal appearing enhancement pattern. Nonobstructing left renal stone is again noted and stable. No obstructive changes are seen. The bladder is partially distended. Stomach/Bowel: The appendix has been surgically removed. Mild inflammatory changes are noted in the right lower quadrant similar to that seen on the prior exam. No focal fluid collection to suggest focal abscess is noted. Previously seen drainage catheters have been removed in the interval. Small bowel is unremarkable.  Stomach is decompressed. Vascular/Lymphatic: No significant vascular findings are present. Scattered lymph nodes are noted in the right lower quadrant slightly more prominent than that seen on the prior exam likely of a reactive nature. Reproductive: Prostate is unremarkable. Other: No abdominal wall hernia or abnormality. No abdominopelvic ascites. Musculoskeletal: No acute or significant osseous findings. IMPRESSION: Status post appendectomy. Some mild residual inflammatory change and scarring is noted in the right lower quadrant. No focal fluid collection is identified. Previously seen drainage catheters have been removed. Mildly reactive lymph nodes in the right lower quadrant. This may represent some mesenteric adenitis. Electronically Signed   By: Inez Catalina M.D.   On: 08/05/2019 20:28    Procedures Procedures (including critical care time)  Medications Ordered in ED Medications  sodium chloride flush (NS) 0.9 % injection 3 mL (3 mLs Intravenous Given 08/05/19 1933)  lactated  ringers bolus 1,000 mL (0 mLs Intravenous Stopped 08/05/19 2104)  acetaminophen (TYLENOL) tablet 650 mg (650 mg Oral Given 08/05/19 1957)  ondansetron (ZOFRAN) injection 4 mg (4 mg Intravenous Given 08/05/19 1927)  iohexol (OMNIPAQUE) 300 MG/ML solution 100 mL (100 mLs Intravenous Contrast Given 08/05/19 2010)  ibuprofen (ADVIL) tablet 800 mg (800 mg Oral Given 08/05/19 2108)    ED Course  I have reviewed the triage vital signs and the nursing notes.  Pertinent labs & imaging results that were available during my care of the patient were reviewed by me and considered in my medical decision making (see chart for details).  MDM Rules/Calculators/A&P                      Patient is a 29 year old male with recent laparoscopic appendectomy, and postoperative course complicated by intra-abdominal abscesses, treated with antibiotics and IR drainage, who presents for increased frequency and severity of abdominal pain over the past week, increased fatigue over the past 2 days, and onset of fevers today.  Upon arrival in the ED, he is well-appearing.  He is febrile and tachycardic.  Physical exam is notable for abdominal tenderness in right lower quadrant and periumbilical region.  When his abdomen is deeply palpated in other areas, he has paramedical pain.  He denies any recent symptoms that would be suggestive of alternative source of infection, i.e. cough, dysuria, sore throat, skin changes.  Bolus of IVF was ordered for tachycardia.  Tylenol was ordered for antipyresis.  Patient declined any other analgesic medications.  He reports that he has avoided narcotic pain medications, even after surgery.  He denies any current nausea.  Labs are notable for the following: Absence of leukocytosis, normal hemoglobin, normal electrolytes, normal lactate, normal lipase, normal hepatobiliary enzymes.  Urinalysis was negative for any sign of infection, hematuria, or proteinuria.  Additional studies ordered include the  following: CT of abdomen pelvis to identify any recurrence of intra-abdominal source of infection, blood cultures, and Covid test.  Upon reassessment, patient had improvement in fever and tachycardia.  CT scan showed no evidence of focal intra-abdominal fluid collection.  There were findings of fatty liver, stable nonobstructing left renal stone, inflammatory changes around surgical site (RLQ) stable since prior exam, and mildly reactive lymph nodes in RLQ, possibly representing mesenteric adenitis.  Covid test was negative.  At this point, patient has fever and worsening abdominal pain with no clear source of infection.  He does have reactive lymph nodes around area of postsurgical changes which may be in response to recent surgery and the source of systemic inflammation.  It is unclear why the systemic response has acutely worsened over the past 2 days.  He has a follow-up appointment with his surgical team on Monday.  These findings are to be discussed at that time, in addition to blood culture results and possible resumption of antibiotics.  Until then, patient was instructed to take scheduled Motrin every 8 hours for relief of pain, inflammation, and fevers, and to return to the ED if his symptoms worsen.  He was discharged home in stable condition.  Final Clinical Impression(s) / ED Diagnoses Final diagnoses:  Right lower quadrant abdominal pain  Nausea  Periumbilical abdominal pain    Rx / DC Orders ED Discharge Orders    None       Gloris Manchester, MD 08/06/19 0319    Charlynne Pander, MD 08/06/19 [phone]

## 2019-08-10 LAB — CULTURE, BLOOD (ROUTINE X 2)
Culture: NO GROWTH
Culture: NO GROWTH
Special Requests: ADEQUATE
Special Requests: ADEQUATE

## 2020-02-14 ENCOUNTER — Encounter: Payer: Self-pay | Admitting: Nurse Practitioner

## 2020-02-23 ENCOUNTER — Encounter: Payer: Self-pay | Admitting: Nurse Practitioner

## 2020-02-23 ENCOUNTER — Ambulatory Visit (INDEPENDENT_AMBULATORY_CARE_PROVIDER_SITE_OTHER): Payer: BC Managed Care – PPO | Admitting: Nurse Practitioner

## 2020-02-23 VITALS — BP 114/72 | HR 90 | Ht 70.0 in | Wt 233.2 lb

## 2020-02-23 DIAGNOSIS — K219 Gastro-esophageal reflux disease without esophagitis: Secondary | ICD-10-CM

## 2020-02-23 MED ORDER — FAMOTIDINE 20 MG PO TABS
20.0000 mg | ORAL_TABLET | Freq: Every day | ORAL | 11 refills | Status: DC
Start: 2020-02-23 — End: 2020-11-21

## 2020-02-23 NOTE — Progress Notes (Signed)
Attending Physician's Attestation   I have reviewed the chart.   I agree with the Advanced Practitioner's note, impression, and recommendations with any updates as below. Most likely will end up being GERD, but rule out eosinophilic esophagitis and lymphocytic esophagitis is reasonable based on patient's age.  Happy to hear that he is not having significant lower abdominal pain and so reasonable to hold on colonoscopic evaluation or repeat cross-sectional imaging at this time unless patient develops recurrent symptoms or pain.  Will see and discuss after endoscopic evaluation from above further recommendations.   [name], MD [facility] Advanced Endoscopy Office # [phone]

## 2020-02-23 NOTE — Patient Instructions (Addendum)
Continue Dexilant 60 mg every morning.   We have sent the following medications to your pharmacy for you to pick up at your convenience: famotidine 20 mg at bedtime.   Avoid eating within 3-4 hours of going to bed.  You have been given a GERD handout.   Call the office if your symptoms get worse.  You have been scheduled for an endoscopy. Please follow written instructions given to you at your visit today. If you use inhalers (even only as needed), please bring them with you on the day of your procedure.  Normal BMI (Body Mass Index- based on height and weight) is between 19 and 25. Your BMI today is Body mass index is 33.46 kg/m. Marland Kitchen Please consider follow up  regarding your BMI with your Primary Care Provider.

## 2020-02-23 NOTE — Progress Notes (Signed)
02/23/2020 Matthew Pace [id number] Oct 08, 1990   CHIEF COMPLAINT:  Night time acid reflux   HISTORY OF PRESENT ILLNESS: Matthew Pace is a 29 year old male with a past medical history of Zaidi, chronic headaches and GERD. He tested positive for Covid 03/2019. He underwent a laparoscopic appendectomy on 07/03/2019 by Dr. Romie Levee due to having a perforated appendicitis. Pathology report was benign. He developed postoperative abdominal pain with N/V and fever. He was readmitted to [facility] 07/05/2019.  A repeat CT scan showed evidence of an ileus and a 3 cm developing abscess which was drained by IR. A repeat CT scan was performed on 4/28 due to continued intermittent nausea, and leukocytosis. This showed enlarging intra-abdomianl fluid collection that was amenable to IR drainage. IR placed second drain on 4/29. His ileus improved, TPN was weaned and diet was advanced and tolerated. A repeat abdominal/pelvic CT 08/05/2019 showed mild residual inflammatory changes and scarring noted in the right lower quadrant, no focal fluid collection was identified. Mild reactive lymph nodes in the right lower quadrant. He was discharged home on 07/16/2019 on Bactrim. He followed up with Dr. Maisie Fus in the outpatient clinic and his abdominal pain resolved, no further follow-up or imaging studies were recommended.   He presents to our office today for further evaluation regarding acid reflux. He complains of having intermittent reflux symptoms which started after he quit smoking 4 years ago. He initially gained about 15 pounds after he quit smoking. No daytime reflux symptoms. He awakens in the middle the night coughing and feels acid backing up into his esophagus and mouth. If he lays back down in bed the symptoms worsen. He intermittently gags himself to purge which relieves his discomfort. He describes vomiting clear to yellow fluid and sometimes partially digested food. No hematemesis. He purges 2-3 times weekly in 1  month then can go 1 month without needing to purge. He had significant acid reflux during the night last week and  He gagged himself for relief. He denies having any dysphagia. He was evaluated by Dr. Loreta Ave approximately 1 year ago for acid reflux. He was prescribed Dexilant 60 mg once daily which initially reduced his reflux symptoms. An EGD was recommended, however, due to the Covid pandemic it was not scheduled. He elected to transition his GI care to [facility]. Dexilant is no longer effective. He used Tums in the past with brief relief.  He reported as a child he vomited easily. He takes ibuprofen 600 mg once monthly as needed for headaches. He previously took [facility] powder in 2015-2016 intermittently for headaches. He passes 2-3 normal formed brown bowel movements daily. No rectal bleeding or black stools. He denies having any upper or lower abdominal pain. However, since his appendectomy surgery if he holds gas or delays a bowel movement he develops a sharp pain from the rectum that shoots upward. His fiance is present.   CBC Latest Ref Rng & Units 08/05/2019 07/16/2019 07/15/2019  WBC 4.0 - 10.5 K/uL 10.3 9.5 8.8  Hemoglobin 13.0 - 17.0 g/dL 24.2 12.9(L) 13.7  Hematocrit 39.0 - 52.0 % 42.4 39.0 41.0  Platelets 150 - 400 K/uL 154 368 324    CMP Latest Ref Rng & Units 08/05/2019 07/15/2019 07/14/2019  Glucose 70 - 99 mg/dL 353(I) 144(R) 154(M)  BUN 6 - 20 mg/dL 15 15 17   Creatinine 0.61 - 1.24 mg/dL 0.86 7.61  Sodium 135 - 145 mmol/L 138 136 134(L)  Potassium 3.5 - 5.1 mmol/L 3.8 4.2 4.2  Chloride 98 - 111 mmol/L 102 101 98  CO2 22 - 32 mmol/L 27 26 25   Calcium 8.9 - 10.3 mg/dL 9.4 8.9 8.9  Total Protein 6.5 - 8.1 g/dL 7.6 - 7.8  Total Bilirubin 0.3 - 1.2 mg/dL 0.9 - 0.6  Alkaline Phos 38 - 126 U/L 115 - 118  AST 15 - 41 U/L 24 - 50(H)  ALT 0 - 44 U/L 34 - 67(H)   Social History: He is engaged. He works as a . He smokes cigarettes 1ppd x 6 years quit 4 years ago. Rare alcohol.  Remote history of marijuana use.   Family History: Mother 34 diabetes. Father 92 reflux symptoms. Brother age 27 healthy. No family history of esophageal, gastric or colon cancer.   Allergies  Allergen Reactions  . Vancomycin Nausea And Vomiting      Outpatient Encounter Medications as of 02/23/2020  Medication Sig  . calcium carbonate (TUMS - DOSED IN MG ELEMENTAL CALCIUM) 500 MG chewable tablet Chew 2 tablets by mouth daily.  14/11/2019 DEXILANT 60 MG capsule Take 60 mg by mouth daily.   . [DISCONTINUED] acetaminophen (TYLENOL) 500 MG tablet Take 2 tablets (1,000 mg total) by mouth every 6 (six) hours.  . [DISCONTINUED] ondansetron (ZOFRAN) 4 MG tablet Take 4 mg by mouth every 6 (six) hours as needed.  . [DISCONTINUED] oxyCODONE (OXY IR/ROXICODONE) 5 MG immediate release tablet Take 1 tablet (5 mg total) by mouth every 6 (six) hours as needed for severe pain (not relieved by tylenol or ibuprofen). (Patient not taking: Reported on 08/05/2019)  . [DISCONTINUED] sulfamethoxazole-trimethoprim (BACTRIM DS) 800-160 MG tablet Take 1 tablet by mouth 2 (two) times daily. (Patient taking differently: Take 1 tablet by mouth 2 (two) times daily. Start date :07/17/19)   No facility-administered encounter medications on file as of 02/23/2020.     REVIEW OF SYSTEMS:  Gen: Denies fever, sweats or chills. No weight loss.  CV: Denies chest pain, palpitations or edema. Resp: Denies cough, shortness of breath of hemoptysis.  GI: See HPI. GU : Denies urinary burning, blood in urine, increased urinary frequency or incontinence. MS: Denies joint pain, muscles aches or weakness. Derm: Denies rash, itchiness, skin lesions or unhealing ulcers. Psych: + Satiety. Heme: Denies bruising, bleeding. Neuro:  Denies headaches, dizziness or paresthesias. Endo:  Denies any problems with DM, thyroid or adrenal function.    PHYSICAL EXAM: BP 114/72   Pulse 90   Ht 5\' 10"  (1.778 m)   Wt 233 lb 3.2 oz (105.8 kg)   SpO2  98%   BMI 33.46 kg/m  General: Well developed 29 year old male in no acute distress. Head: Normocephalic and atraumatic. Eyes:  Sclerae non-icteric, conjunctive pink. Ears: Normal auditory acuity. Mouth: Dentition intact. No ulcers or lesions.  Neck: Supple, no lymphadenopathy or thyromegaly.  Lungs: Clear bilaterally to auscultation without wheezes, crackles or rhonchi. Heart: Regular rate and rhythm. No murmur, rub or gallop appreciated.  Abdomen: Soft, nontender, non distended. No masses. No hepatosplenomegaly. Normoactive bowel sounds x 4 quadrants.  Rectal: Deferred.  Musculoskeletal: Symmetrical with no gross deformities. Skin: Warm and dry. No rash or lesions on visible extremities. Extremities: No edema. Neurological: Alert oriented x 4, no focal deficits.  Psychological:  Alert and cooperative. Normal mood and affect.  ASSESSMENT AND PLAN:  14. 29 year old male with nighttime acid reflux at risk for aspiration  -Continue Dexilant 60 mg in the morning. Add Famotidine 20 mg at bedtime. -GERD handout. Do not eat within 3 to  4 hours of going to bed. -Weight loss recommended. -EGD benefits and risks discussed including risk with sedation, risk of bleeding, perforation and infection   2. S/p laparoscopic appendectomy for perforated appendicitis 07/03/2019 complicated by postoperative abscess which required drainage by IR. See HPI.  -Discussed scheduling a repeat abdominal/pelvic CT scan to further verify complete resolution of residual inflammation to the RLQ seen on CT 07/16/2019. The patient preferred not to repeat a CT abdomen pelvis as he is not experiencing any abdominal pain.  3. Infrequent rectal pain associated with delaying a bowel movement or suppressing gas from the rectum while at work. -Patient will call our office if symptoms persist or worsen         CC:  Deatra James, MD

## 2020-02-24 ENCOUNTER — Other Ambulatory Visit: Payer: Self-pay

## 2020-02-28 ENCOUNTER — Encounter: Payer: Self-pay | Admitting: Gastroenterology

## 2020-02-28 ENCOUNTER — Other Ambulatory Visit: Payer: Self-pay

## 2020-02-28 ENCOUNTER — Ambulatory Visit (AMBULATORY_SURGERY_CENTER): Payer: BC Managed Care – PPO | Admitting: Gastroenterology

## 2020-02-28 VITALS — BP 116/79 | HR 74 | Temp 98.6°F | Resp 12 | Ht 70.0 in | Wt 233.0 lb

## 2020-02-28 DIAGNOSIS — K219 Gastro-esophageal reflux disease without esophagitis: Secondary | ICD-10-CM

## 2020-02-28 DIAGNOSIS — K319 Disease of stomach and duodenum, unspecified: Secondary | ICD-10-CM

## 2020-02-28 DIAGNOSIS — K449 Diaphragmatic hernia without obstruction or gangrene: Secondary | ICD-10-CM | POA: Diagnosis not present

## 2020-02-28 DIAGNOSIS — K2970 Gastritis, unspecified, without bleeding: Secondary | ICD-10-CM

## 2020-02-28 DIAGNOSIS — K3189 Other diseases of stomach and duodenum: Secondary | ICD-10-CM | POA: Diagnosis not present

## 2020-02-28 MED ORDER — SODIUM CHLORIDE 0.9 % IV SOLN
500.0000 mL | Freq: Once | INTRAVENOUS | Status: DC
Start: 2020-02-28 — End: 2020-02-28

## 2020-02-28 NOTE — Progress Notes (Signed)
Report given to PACU, vss 

## 2020-02-28 NOTE — Op Note (Signed)
[place] Patient Name: Matthew Pace Procedure Date: 02/28/2020 10:26 AM MRN: [id number] Endoscopist: Justice Britain , MD Age: 29 Referring MD:  Date of Birth: [birth year] Gender: Male Account #: [id number] Procedure:                Upper GI endoscopy Indications:              Gastro-esophageal reflux disease, Esophageal reflux                            symptoms that persist despite appropriate therapy,                            Vomiting Medicines:                Monitored Anesthesia Care Procedure:                Pre-Anesthesia Assessment:                           - Prior to the procedure, a History and Physical                            was performed, and patient medications and                            allergies were reviewed. The patient's tolerance of                            previous anesthesia was also reviewed. The risks                            and benefits of the procedure and the sedation                            options and risks were discussed with the patient.                            All questions were answered, and informed consent                            was obtained. Prior Anticoagulants: The patient has                            taken no previous anticoagulant or antiplatelet                            agents. ASA Grade Assessment: II - A patient with                            mild systemic disease. After reviewing the risks                            and benefits, the patient was deemed in  satisfactory condition to undergo the procedure.                           After obtaining informed consent, the endoscope was                            passed under direct vision. Throughout the                            procedure, the patient's blood pressure, pulse, and                            oxygen saturations were monitored continuously. The                            Endoscope was introduced through the  mouth, and                            advanced to the second part of duodenum. The upper                            GI endoscopy was accomplished without difficulty.                            The patient tolerated the procedure. Scope In: Scope Out: Findings:                 No gross lesions were noted in the proximal                            esophagus and in the mid esophagus.                           LA Grade C (one or more mucosal breaks continuous                            between tops of 2 or more mucosal folds, less than                            75% circumference) esophagitis with no bleeding was                            found in the distal esophagus.                           The Z-line was irregular and was found 39 cm from                            the incisors.                           Biopsies were taken with a cold forceps in the  entire esophagus for histology to rule out EoE/LoE.                           A medium-sized hiatal hernia was present (5 cm in                            length).                           Patchy mildly erythematous mucosa without bleeding                            was found in the gastric body.                           No other gross lesions were noted in the entire                            examined stomach. Biopsies were taken with a cold                            forceps for histology and Helicobacter pylori                            testing.                           No gross lesions were noted in the duodenal bulb,                            in the first portion of the duodenum and in the                            second portion of the duodenum. Biopsies were taken                            with a cold forceps for histology. Complications:            No immediate complications. Estimated Blood Loss:     Estimated blood loss was minimal. Impression:               - No gross lesions in esophagus  proximally. LA                            Grade C reflux esophagitis with no bleeding                            distally. Esophagus biopsied for EoE/LoE rule out.                           - Z-line irregular, 39 cm from the incisors.                           - Medium-sized hiatal hernia (5 cm).                           -  Erythematous mucosa in the gastric body. No other                            gross lesions in the stomach. Biopsied.                           - No gross lesions in the duodenal bulb, in the                            first portion of the duodenum and in the second                            portion of the duodenum. Biopsied. Recommendation:           - The patient will be observed post-procedure,                            until all discharge criteria are met.                           - Discharge patient to home.                           - Patient has a contact number available for                            emergencies. The signs and symptoms of potential                            delayed complications were discussed with the                            patient. Return to normal activities tomorrow.                            Written discharge instructions were provided to the                            patient.                           - Resume previous diet.                           - Continue present medications.                           - No aspirin, ibuprofen, naproxen, or other                            non-steroidal anti-inflammatory drugs.                           - Await pathology results.                           -  Repeat upper endoscopy in 4 months to check                            healing and ensure no evidence of underlying                            Barrett's esophagus.                           - Patient could get some benefit from Gaviscon                            addition in future, but if symptoms of GERD persist                             and medications fail then other treatments                            including Fundoplication + Hiatal hernia repair                            could be considered.                           - Discuss with PCP consideration of Sleep Apnea                            testing.                           - The findings and recommendations were discussed                            with the patient.                           - The findings and recommendations were discussed                            with the designated responsible adult. Justice Britain, MD 02/28/2020 10:48:10 AM

## 2020-02-28 NOTE — Progress Notes (Signed)
Pt's states no medical or surgical changes since previsit or office visit.  CW - vitals 

## 2020-02-28 NOTE — Progress Notes (Signed)
1025 Robinul 0.1 mg IV given due large amount of secretions upon assessment.  MD made aware, vss  

## 2020-02-28 NOTE — Progress Notes (Signed)
Called to room to assist during endoscopic procedure.  Patient ID and intended procedure confirmed with present staff. Received instructions for my participation in the procedure from the performing physician.  

## 2020-02-28 NOTE — Patient Instructions (Signed)
Handouts provided on esophagitis, gastritis and hiatal hernia.   No aspirin, ibuprofen, naproxen, or other non-steriodal anti-inflammatory drugs.   Repeat upper endoscopy in 4 months to check healing and ensure no evidence of underlying Barrett's esophagus.   Discuss with PCP consideration of Sleep Apnea testing.   YOU HAD AN ENDOSCOPIC PROCEDURE TODAY AT THE [facility]:   Refer to the procedure report that was given to you for any specific questions about what was found during the examination.  If the procedure report does not answer your questions, please call your gastroenterologist to clarify.  If you requested that your care partner not be given the details of your procedure findings, then the procedure report has been included in a sealed envelope for you to review at your convenience later.  YOU SHOULD EXPECT: Some feelings of bloating in the abdomen. Passage of more gas than usual.  Walking can help get rid of the air that was put into your GI tract during the procedure and reduce the bloating. If you had a lower endoscopy (such as a colonoscopy or flexible sigmoidoscopy) you may notice spotting of blood in your stool or on the toilet paper. If you underwent a bowel prep for your procedure, you may not have a normal bowel movement for a few days.  Please Note:  You might notice some irritation and congestion in your nose or some drainage.  This is from the oxygen used during your procedure.  There is no need for concern and it should clear up in a day or so.  SYMPTOMS TO REPORT IMMEDIATELY:   Following upper endoscopy (EGD)  Vomiting of blood or coffee ground material  New chest pain or pain under the shoulder blades  Painful or persistently difficult swallowing  New shortness of breath  Fever of 100F or higher  Black, tarry-looking stools  For urgent or emergent issues, a gastroenterologist can be reached at any hour by calling [phone]. Do not use MyChart  messaging for urgent concerns.    DIET:  We do recommend a small meal at first, but then you may proceed to your regular diet.  Drink plenty of fluids but you should avoid alcoholic beverages for 24 hours.  ACTIVITY:  You should plan to take it easy for the rest of today and you should NOT DRIVE or use heavy machinery until tomorrow (because of the sedation medicines used during the test).    FOLLOW UP: Our staff will call the number listed on your records 48-72 hours following your procedure to check on you and address any questions or concerns that you may have regarding the information given to you following your procedure. If we do not reach you, we will leave a message.  We will attempt to reach you two times.  During this call, we will ask if you have developed any symptoms of COVID 19. If you develop any symptoms (ie: fever, flu-like symptoms, shortness of breath, cough etc.) before then, please call [phone].  If you test positive for Covid 19 in the 2 weeks post procedure, please call and report this information to [place].    If any biopsies were taken you will be contacted by phone or by letter within the next 1-3 weeks.  Please call us at [phone] if you have not heard about the biopsies in 3 weeks.    SIGNATURES/CONFIDENTIALITY: You and/or your care partner have signed paperwork which will be entered into your electronic medical record.  These signatures  attest to the fact that that the information above on your After Visit Summary has been reviewed and is understood.  Full responsibility of the confidentiality of this discharge information lies with you and/or your care-partner.

## 2020-03-01 ENCOUNTER — Telehealth: Payer: Self-pay

## 2020-03-01 NOTE — Telephone Encounter (Signed)
First post procedure follow up call, no answer 

## 2020-03-01 NOTE — Telephone Encounter (Signed)
LVM

## 2020-03-05 ENCOUNTER — Encounter: Payer: Self-pay | Admitting: Gastroenterology

## 2020-03-23 ENCOUNTER — Encounter: Payer: BC Managed Care – PPO | Admitting: Gastroenterology

## 2020-04-27 ENCOUNTER — Telehealth: Payer: Self-pay | Admitting: Gastroenterology

## 2020-04-27 MED ORDER — DEXILANT 60 MG PO CPDR: 60.0000 mg | DELAYED_RELEASE_CAPSULE | Freq: Every day | ORAL | 2 refills | Status: DC

## 2020-04-27 NOTE — Telephone Encounter (Signed)
Rx for Dexilant sent to [facility]

## 2020-04-27 NOTE — Telephone Encounter (Signed)
Pt called requesting prescription for Dexilant 60mg . Pt states that his previous GI used to prescribed it. He takes 1 pill/day. [facility].

## 2020-05-14 ENCOUNTER — Other Ambulatory Visit: Payer: Self-pay

## 2020-05-14 ENCOUNTER — Telehealth: Payer: Self-pay | Admitting: Gastroenterology

## 2020-05-14 MED ORDER — DEXILANT 60 MG PO CPDR: 60.0000 mg | DELAYED_RELEASE_CAPSULE | Freq: Every day | ORAL | 1 refills | Status: DC

## 2020-05-14 NOTE — Telephone Encounter (Signed)
90 day supply sent [facility]

## 2020-08-09 ENCOUNTER — Telehealth: Payer: Self-pay | Admitting: Gastroenterology

## 2020-08-09 MED ORDER — DEXILANT 60 MG PO CPDR: 60.0000 mg | DELAYED_RELEASE_CAPSULE | Freq: Every day | ORAL | 1 refills | Status: DC

## 2020-08-09 NOTE — Telephone Encounter (Signed)
Refill sent to pharmacy.   

## 2020-10-21 ENCOUNTER — Ambulatory Visit
Admission: EM | Admit: 2020-10-21 | Discharge: 2020-10-21 | Disposition: A | Discharge status: Home / Self Care | Payer: BC Managed Care – PPO

## 2020-10-21 DIAGNOSIS — L089 Local infection of the skin and subcutaneous tissue, unspecified: Secondary | ICD-10-CM

## 2020-10-21 DIAGNOSIS — L729 Follicular cyst of the skin and subcutaneous tissue, unspecified: Secondary | ICD-10-CM

## 2020-10-21 MED ORDER — DOXYCYCLINE HYCLATE 100 MG PO CAPS
100.0000 mg | ORAL_CAPSULE | Freq: Two times a day (BID) | ORAL | 0 refills | Status: DC
Start: 2020-10-21 — End: 2021-06-07

## 2020-10-21 MED ORDER — NAPROXEN 500 MG PO TABS
500.0000 mg | ORAL_TABLET | Freq: Two times a day (BID) | ORAL | 0 refills | Status: DC
Start: 2020-10-21 — End: 2021-05-29

## 2020-10-21 NOTE — ED Provider Notes (Signed)
[facility]   MRN: [id number] DOB: [birth year]  Subjective:   Matthew Pace is a 30 y.o. male presenting for several month history of acute onset worseningwaxing and waning swelling over the base of his neck on the right side.  Reports that in the past couple of days is gotten more painful and more swollen.  Denies any drainage, fever, nausea, vomiting.  He has previously had to have cyst removed but has not seen a dermatologist in a long time.  No current facility-administered medications for this encounter.  Current Outpatient Medications:    famotidine (PEPCID) 20 MG tablet, Take 1 tablet (20 mg total) by mouth at bedtime., Disp: 30 tablet, Rfl: 11   omeprazole (PRILOSEC) 10 MG capsule, Take 10 mg by mouth in the morning and at bedtime., Disp: , Rfl:    calcium carbonate (TUMS - DOSED IN MG ELEMENTAL CALCIUM) 500 MG chewable tablet, Chew 2 tablets by mouth daily., Disp: , Rfl:    DEXILANT 60 MG capsule, Take 1 capsule (60 mg total) by mouth daily., Disp: 90 capsule, Rfl: 1   Allergies  Allergen Reactions   Vancomycin Nausea And Vomiting    Past Medical History:  Diagnosis Date   GERD (gastroesophageal reflux disease)      Past Surgical History:  Procedure Laterality Date   IR RADIOLOGIST EVAL & MGMT  07/27/2019   LAPAROSCOPIC APPENDECTOMY N/A 07/03/2019   Procedure: APPENDECTOMY LAPAROSCOPIC;  Surgeon: Romie Levee, MD;  Location: [facility];  Service: General;  Laterality: N/A;   MOUTH SURGERY      Family History  Problem Relation Age of Onset   Diabetes Mother    Hypertension Mother    Diabetes Maternal Grandmother    Stroke Maternal Grandmother    Cancer Maternal Grandfather    Heart disease Paternal Grandfather    Stroke Paternal Grandfather    Colon cancer Neg Hx    Stomach cancer Neg Hx    Pancreatic cancer Neg Hx    Esophageal cancer Neg Hx    Liver disease Neg Hx    Rectal cancer Neg Hx     Social History   Tobacco Use   Smoking status:  Former    Packs/day: 1.00    Types: Cigarettes    Quit date: 12/31/2015    Years since quitting: 4.8   Smokeless tobacco: Never  Vaping Use   Vaping Use: Never used  Substance Use Topics   Alcohol use: Yes    Comment: social   Drug use: No    ROS   Objective:   Vitals: BP 129/78 (BP Location: Left Arm)   Pulse 82   Temp 98 F (36.7 C) (Oral)   Resp 18   SpO2 96%   Physical Exam Constitutional:      General: He is not in acute distress.    Appearance: Normal appearance. He is well-developed and normal weight. He is not ill-appearing, toxic-appearing or diaphoretic.  HENT:     Head: Normocephalic and atraumatic.     Right Ear: External ear normal.     Left Ear: External ear normal.     Nose: Nose normal.     Mouth/Throat:     Pharynx: Oropharynx is clear.  Eyes:     General: No scleral icterus.       Right eye: No discharge.        Left eye: No discharge.     Extraocular Movements: Extraocular movements intact.     Pupils: Pupils are equal, round,  and reactive to light.  Cardiovascular:     Rate and Rhythm: Normal rate.  Pulmonary:     Effort: Pulmonary effort is normal.  Musculoskeletal:     Cervical back: Normal range of motion.  Skin:      Neurological:     Mental Status: He is alert and oriented to person, place, and time.  Psychiatric:        Mood and Affect: Mood normal.        Behavior: Behavior normal.        Thought Content: Thought content normal.        Judgment: Judgment normal.    PROCEDURE NOTE: sebaceous cyst excision Verbal consent obtained. Local anesthesia with 3cc of 2% lidocaine with epinephrine. Sterile prep and drape. An elliptical incision was made extending ~1cm using a 15 blade, the cyst was dissected from the surrounding tissue with curved hemostats. Cleansed and dressed.   Assessment and Plan :   PDMP not reviewed this encounter.  1. Infected cyst of skin     Start doxycycline, naproxen for pain and inflammation.  Wound  care reviewed.  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 Counseled patient on potential for adverse effects with medications prescribed/recommended today, ER and return-to-clinic precautions discussed, patient verbalized understanding.    Wallis Bamberg, [place] 10/21/20 [id number]

## 2020-10-21 NOTE — Discharge Instructions (Addendum)
Please change your dressing 2-3 times daily. Do not apply any ointments or creams. Each time you change your dressing, make sure that you are pressing on the wound to get pus to come out.  Try your best to have a family member help you clean gently around the perimeter of the wound with gentle soap and warm water. Pat your wound dry and let it air out if possible to make sure it is dry before reapplying another dressing.  

## 2020-10-21 NOTE — ED Triage Notes (Signed)
Red raised lesion on the right posterior surface of his neck.  Pt notes that he had the lesion for months but that it felt very small and not visible to the eye. Within the last two days the lesion has increased in size and pain. Confirms tenderness to the touch, noting that he didn't sleep well last night. Has tried warm compresses without relief.

## 2020-11-14 ENCOUNTER — Telehealth: Payer: Self-pay | Admitting: Gastroenterology

## 2020-11-14 NOTE — Telephone Encounter (Signed)
Inbound call from patient states dexilant is not covered by insurance and is $1100. Requesting something more affordable, maybe omeprazole. [facility]

## 2020-11-21 MED ORDER — OMEPRAZOLE 20 MG PO CPDR
20.0000 mg | DELAYED_RELEASE_CAPSULE | Freq: Every day | ORAL | 3 refills | Status: DC
Start: 2020-11-21 — End: 2020-11-21

## 2020-11-21 MED ORDER — OMEPRAZOLE 40 MG PO CPDR
40.0000 mg | DELAYED_RELEASE_CAPSULE | Freq: Every day | ORAL | 3 refills | Status: DC
Start: 2020-11-21 — End: 2021-06-07

## 2020-11-21 NOTE — Telephone Encounter (Addendum)
Spoke with patient and he states that he has been taking Omeprazole OTC 20mg  -2 capsules once daily and Pepcid 20mg  -twice daily. Pt would like prescription for Omeprazole. Also patient states that he has been taking Pepcid 20mg  BID because QHS is not effective. Please advise.

## 2020-11-21 NOTE — Addendum Note (Signed)
Addended byLucky Rathke on: 11/21/2020 10:33 AM   Modules accepted: Orders

## 2020-11-21 NOTE — Addendum Note (Signed)
Addended byLucky Rathke on: 11/21/2020 11:02 AM   Modules accepted: Orders

## 2020-11-21 NOTE — Telephone Encounter (Signed)
Send prescription for Omeprazole 40 mg daily. May continue Pepcid at his dosing. Have him follow up with CKS or myself in the coming weeks to discuss next steps/followup. Thanks. GM

## 2020-11-21 NOTE — Telephone Encounter (Signed)
Prescription for Omeprazole 40mg  once daily has been sent to pharmacy. Pt to continue taking Pepcid 20mg  twice daily. I called and left message for patient asking for return call.

## 2021-03-11 IMAGING — CT CT ABD-PELV W/ CM
2 of 4 series · 15 of 46 positions shown, 17 images · IV contrast (APPLIED)
Comparison: 07/05/2019 and 07/03/2019

CLINICAL DATA: Abdominal abscess or infection suspected. Status
post perforated appendix.

EXAM:
CT ABDOMEN AND PELVIS WITH CONTRAST
TECHNIQUE: Multidetector CT imaging of the abdomen and pelvis was performed
using the standard protocol following bolus administration of
intravenous contrast.
CONTRAST:  80mL OMNIPAQUE IOHEXOL 300 MG/ML  SOLN

[Series 3: abd/ pelvis 5.0 i30f 2 · axial · 0.82mm/px · z∈[+708,+1178]mm · 12 of 108 slices shown, 14 images]
[im 9/108  soft-tissue]
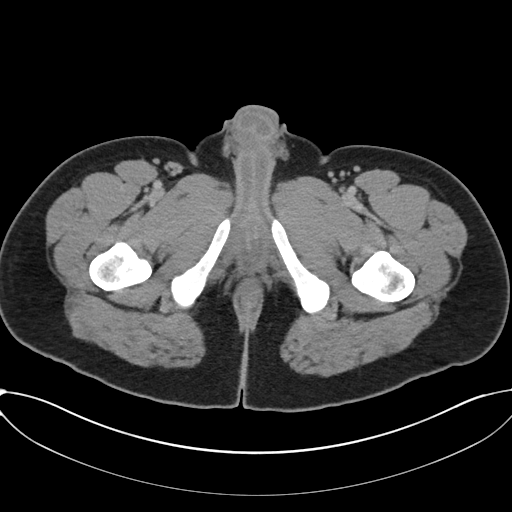
[im 9/108  bone]
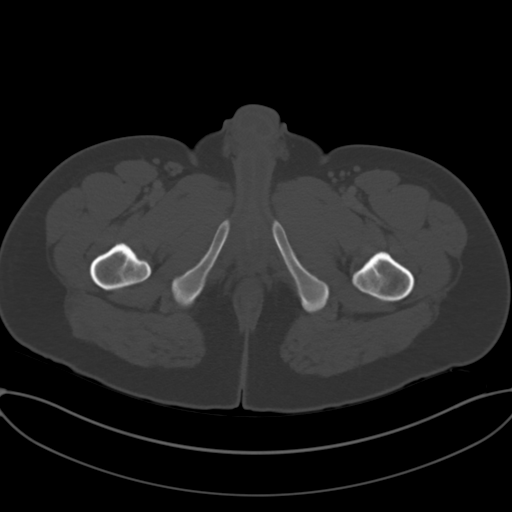
[im 18/108  soft-tissue]
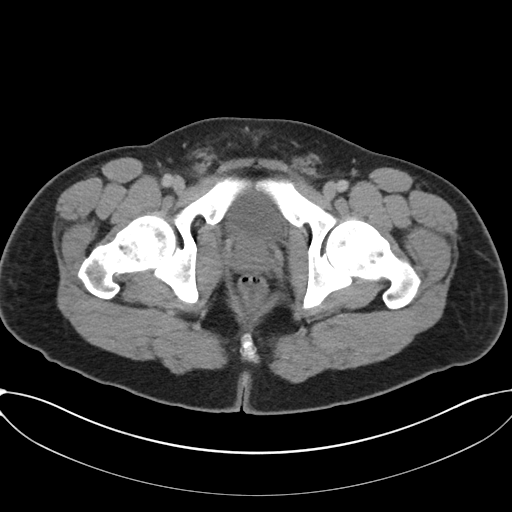
[im 26/108  soft-tissue]
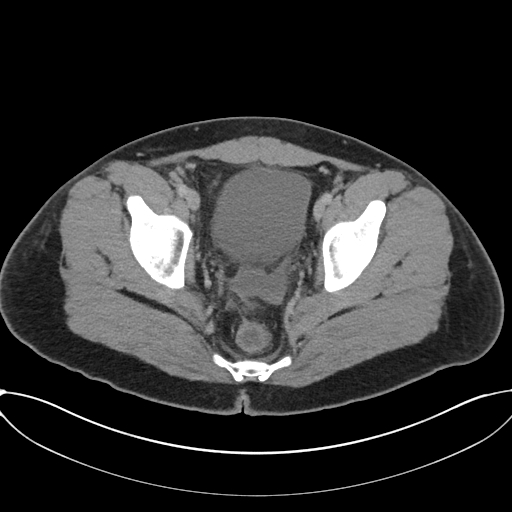
[im 35/108  soft-tissue]
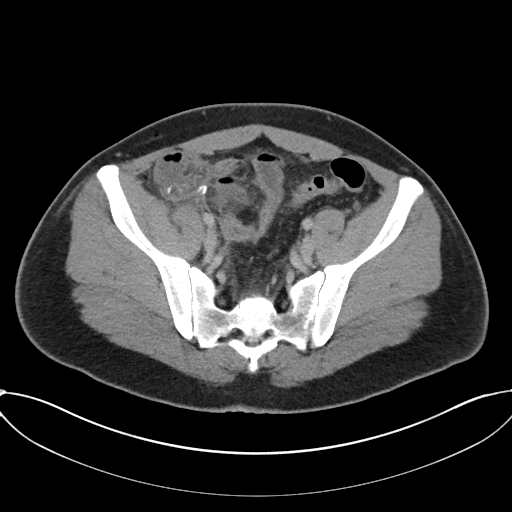
[im 43/108  soft-tissue]
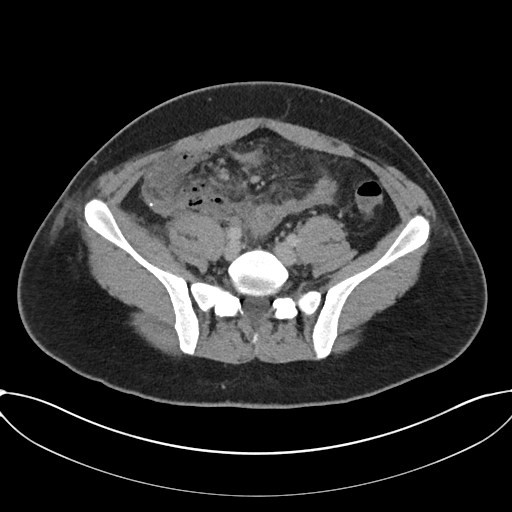
[im 52/108  soft-tissue]
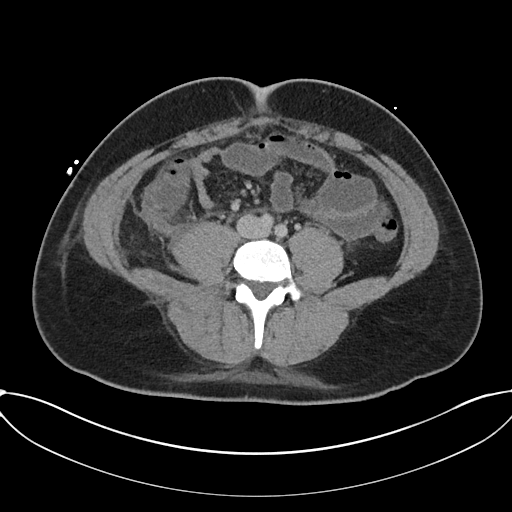
[im 60/108  soft-tissue]
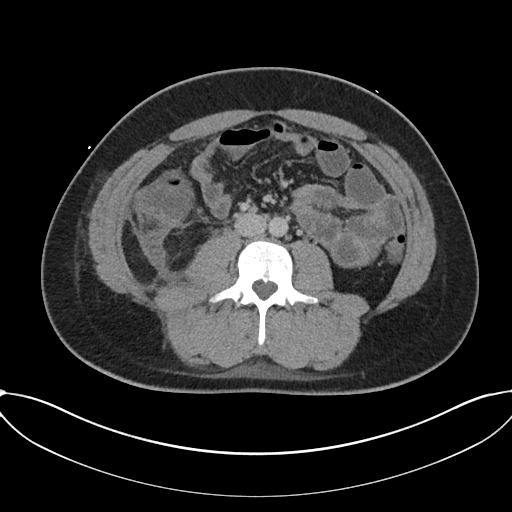
[im 69/108  soft-tissue]
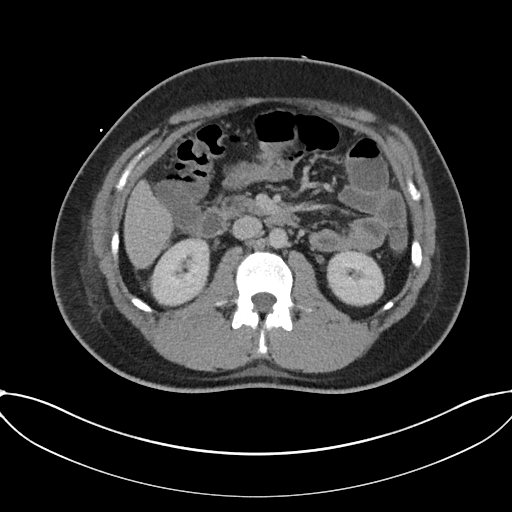
[im 78/108  soft-tissue]
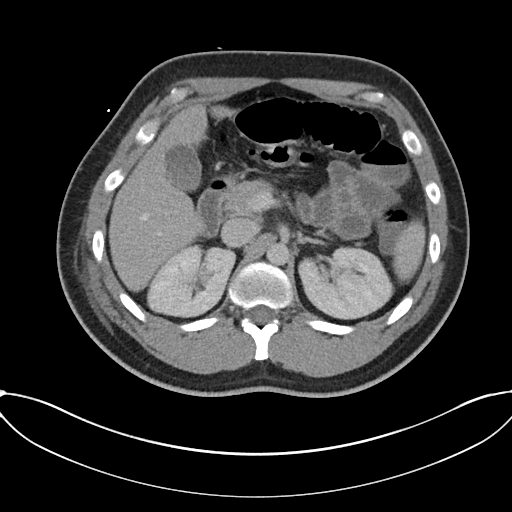
[im 78/108  bone]
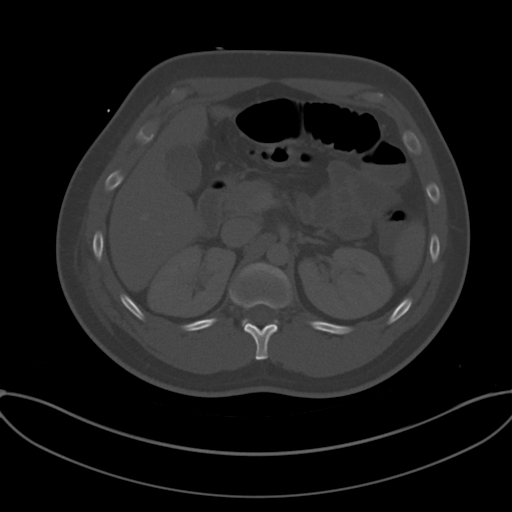
[im 86/108  soft-tissue]
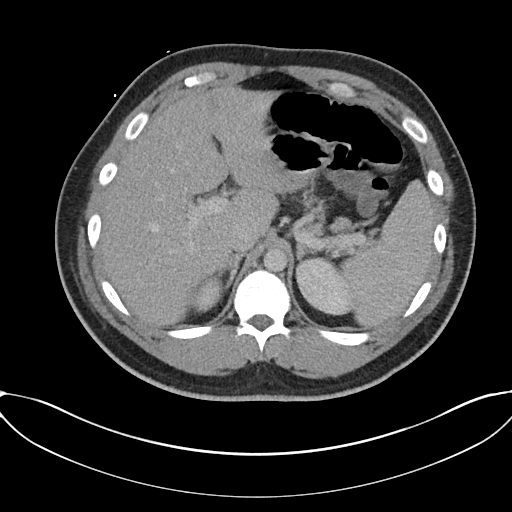
[im 95/108  soft-tissue]
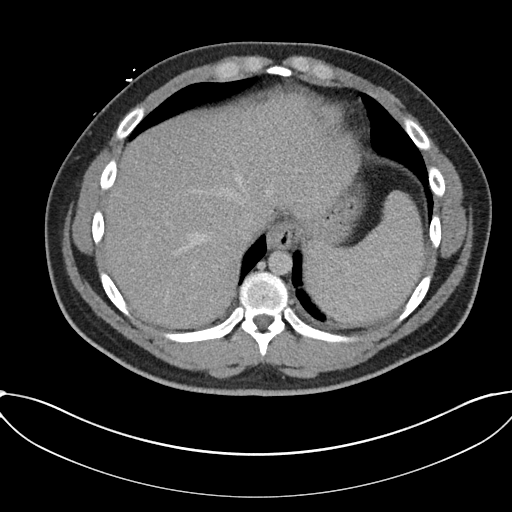
[im 103/108  soft-tissue]
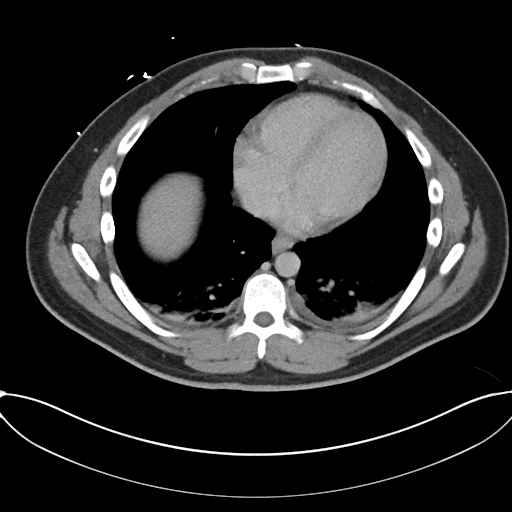

[Series 6: coronal soft tissue · coronal · 0.94mm/px · 3 of 107 slices shown]
[im 36/107  soft-tissue]
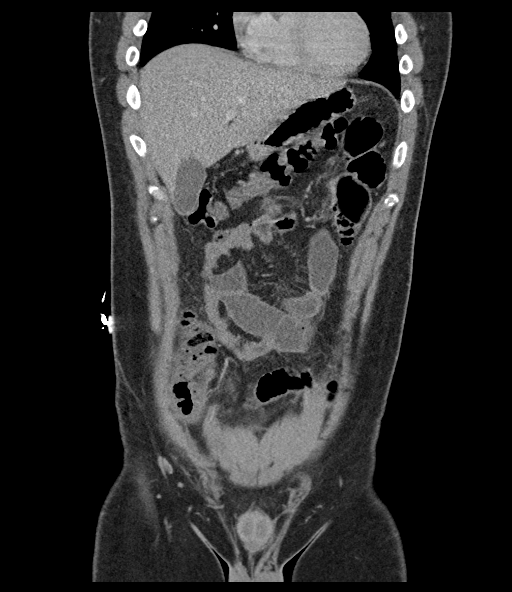
[im 48/107  soft-tissue]
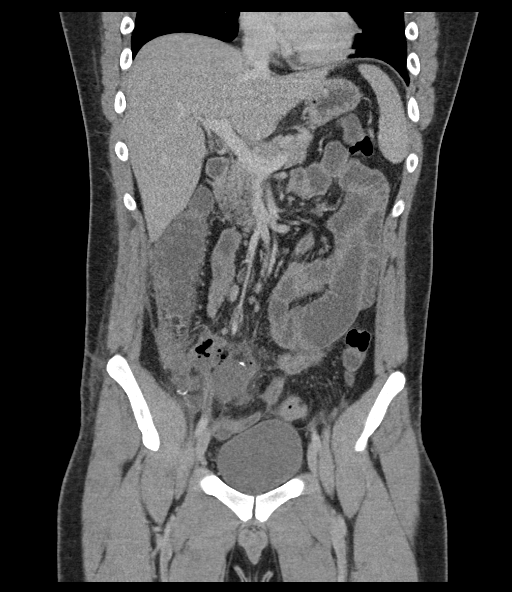
[im 59/107  soft-tissue]
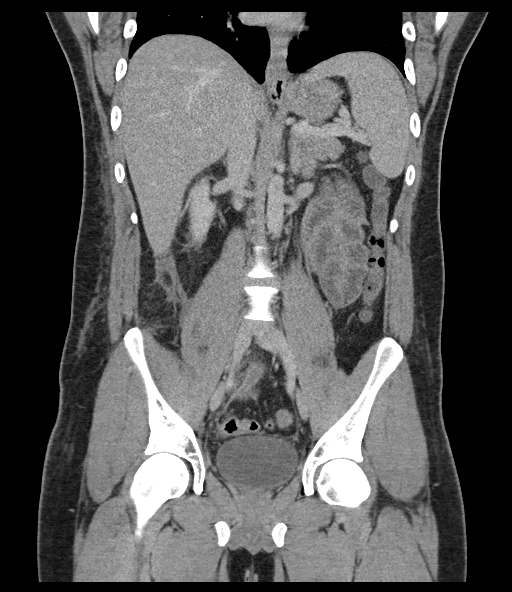

[15 of 46 positions shown; findings below may reference images not displayed]

FINDINGS: Lower chest: Dependent changes are identified within the lung bases
posteriorly including increased ground-glass attenuation, pleural
effusion and subsegmental atelectasis.

Hepatobiliary: No focal liver abnormality is seen. No gallstones,
gallbladder wall thickening, or biliary dilatation.

Pancreas: Unremarkable. No pancreatic ductal dilatation or
surrounding inflammatory changes.

Spleen: Normal in size without focal abnormality.

Adrenals/Urinary Tract: Normal appearance of the adrenal glands. 2
mm stone within inferior pole of left kidney noted. No
hydronephrosis identified bilaterally. Urinary bladder is
unremarkable.

Stomach/Bowel: The stomach is nondistended. Increase caliber of the
small bowel loops are identified which measure up to 3.5 cm. There
is a transition to decreased caliber distal small bowel loops within
the left lower quadrant.

Postoperative changes are identified within the right lower quadrant
of the abdomen. There is associated inflammation, free fluid and fat
stranding.

Vascular/Lymphatic: Normal appearance of the abdominal aorta.
Prominent ileocolic lymph nodes are identified in the right lower
quadrant of the abdomen, likely reactive, image 63/3.

Reproductive: Prostate is unremarkable.

Other: Early, immature fluid collection, in the right lower quadrant
of the abdomen measures 5.9 by 3.4 by 5.3 cm.

A separate fluid collection is identified between the anterior wall
of rectum and bladder measuring 4.5 x 2.9 by 2.9 cm, image 82/3.

Lastly, inflammation, phlegmon and possible small linear fluid
collection is identified tracking along the right pericolic gutter,
image 56/6.

Musculoskeletal: No acute or significant osseous findings.
IMPRESSION: 1. Early, immature fluid collection, in the right lower quadrant of
the abdomen is identified, which may reflect early abscess. A
separate fluid collection is identified between the anterior wall of
rectum and bladder and may represent an additional abscess.
Inflammation, phlegmon and possible early fluid collection is also
noted tracking along the right pericolic gutter towards the inferior
margin of the liver.
2. Increase caliber of the small bowel loops with transition to
decreased caliber distal small bowel loops within the left lower
quadrant. Findings concerning for partial small bowel obstruction
versus paralytic ileus secondary to inflammation.
3. Nonobstructing left renal calculus.
4. Dependent changes are identified within the lung bases
posteriorly including increased ground-glass attenuation,
ground-glass attenuation, pleural effusion and subsegmental
atelectasis.

## 2021-03-16 IMAGING — DX DG CHEST 1V PORT
1 series · 1 of 1 positions shown · non-contrast
Comparison: Radiograph 07/04/2019.

CLINICAL DATA: Shortness of breath.

EXAM:
PORTABLE CHEST 1 VIEW

[chest ap]
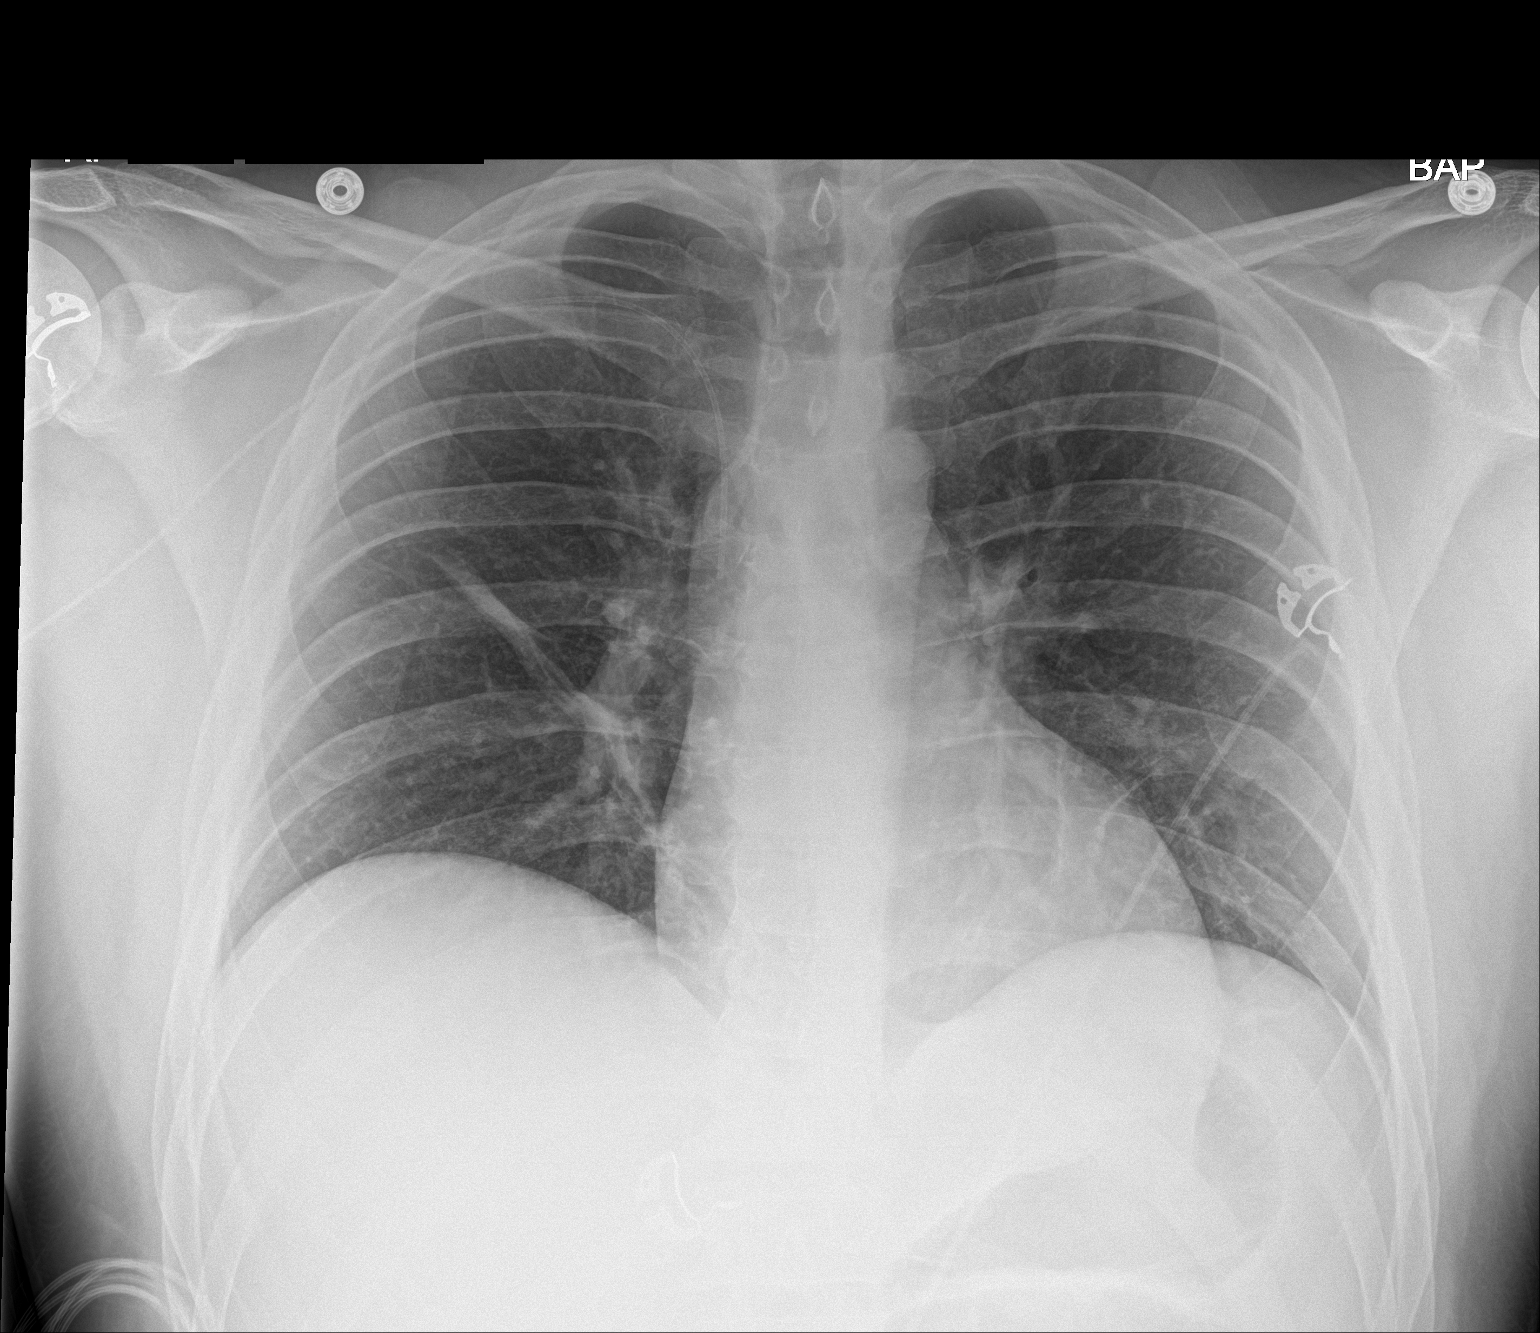

[1 of 1 positions shown; findings below may reference images not displayed]

FINDINGS: Low lung volumes. Linear perihilar opacities, right greater than
left, likely atelectasis. No confluent airspace disease. No
significant pleural fluid. No pneumothorax. Tip of the right upper
extremity PICC is in the mid SVC.
IMPRESSION: Low lung volumes with perihilar atelectasis.

## 2021-05-17 ENCOUNTER — Other Ambulatory Visit: Payer: Self-pay | Admitting: Nurse Practitioner

## 2021-05-27 NOTE — Progress Notes (Unsigned)
° ° °  05/27/2021 Matthew Pace [id number] Sep 14, 1990   ASSESSMENT AND PLAN:  *** There are no diagnoses linked to this encounter.   History of Present Illness:  31 y.o. 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 Rush Landmark returns to clinic today for evaluation of GERD. Has been on dexilant in the past. 02/28/2020 EGD with Dr. Rush Landmark showed Grade C esophagitis negative for EOE, 5 cm hiatal hernia, negative H pylori gastritis and negative celiac.   Previous GI history:  No results found.  Current Medications:      Current Outpatient Medications (Analgesics):    naproxen (NAPROSYN) 500 MG tablet, Take 1 tablet (500 mg total) by mouth 2 (two) times daily with a meal.   Current Outpatient Medications (Other):    calcium carbonate (TUMS - DOSED IN MG ELEMENTAL CALCIUM) 500 MG chewable tablet, Chew 2 tablets by mouth daily.   doxycycline (VIBRAMYCIN) 100 MG capsule, Take 1 capsule (100 mg total) by mouth 2 (two) times daily.   famotidine (PEPCID) 20 MG tablet, TAKE 1 TABLET BY MOUTH EVERYDAY AT BEDTIME   omeprazole (PRILOSEC) 40 MG capsule, Take 1 capsule (40 mg total) by mouth daily.  Surgical History:  He  has a past surgical history that includes laparoscopic appendectomy (N/A, 07/03/2019); IR Radiologist Eval & Mgmt (07/27/2019); and Mouth surgery. Family History:  His family history includes Cancer in his maternal grandfather; Diabetes in his maternal grandmother and mother; Heart disease in his paternal grandfather; Hypertension in his mother; Stroke in his maternal grandmother and paternal grandfather. Social History:   reports that he quit smoking about 5 years ago. His smoking use included cigarettes. He smoked an average of 1 pack per day. He has never used smokeless tobacco. He reports current alcohol use. He reports that he does not use drugs.  Current  Medications, Allergies, Past Medical History, Past Surgical History, Family History and Social History were reviewed in [employer] record.  Physical Exam: There were no vitals taken for this visit. General:   Pleasant, well developed male in no acute distress Eyes: {sclerae:26738},conjunctive {conjuctiva:26739}  Heart:  {HEART EXAM HEM/ONC:21750} Pulm: Clear anteriorly; no wheezing Abdomen:  {BlankSingle:19197::"Distended","Ridged","Soft"}, {BlankSingle:19197::"Flat","Obese"} AB, skin exam {ABDOMEN SKIN EXAM:22649}, {BlankSingle:19197::"Absent","Hyperactive, tinkling","Hypoactive","Sluggish","Normal"} bowel sounds. {Desc; pc desc - abdomen tenderness:5168} tenderness {anatomy; site abdomen:5010}. {BlankMultiple:19196::"Without guarding","With guarding","Without rebound","With rebound"}, {Exam; abdomen organomegaly:15152}. Extremities:  {With/Without:304960234} edema. Peripheral pulses intact.  Neurologic:  Alert and  oriented x4;  grossly normal neurologically. Skin:   Dry and intact without significant lesions or rashes. Psychiatric: Demonstrates good judgement and reason without abnormal affect or behaviors.  Vladimir Crofts, PA-C 05/27/21

## 2021-05-29 ENCOUNTER — Encounter: Payer: Self-pay | Admitting: Physician Assistant

## 2021-05-29 ENCOUNTER — Ambulatory Visit (INDEPENDENT_AMBULATORY_CARE_PROVIDER_SITE_OTHER): Payer: Commercial Managed Care - PPO | Admitting: Physician Assistant

## 2021-05-29 VITALS — BP 108/76 | HR 100 | Ht 70.0 in | Wt 244.4 lb

## 2021-05-29 DIAGNOSIS — Z6835 Body mass index (BMI) 35.0-35.9, adult: Secondary | ICD-10-CM | POA: Diagnosis not present

## 2021-05-29 DIAGNOSIS — K219 Gastro-esophageal reflux disease without esophagitis: Secondary | ICD-10-CM | POA: Diagnosis not present

## 2021-05-29 DIAGNOSIS — K449 Diaphragmatic hernia without obstruction or gangrene: Secondary | ICD-10-CM

## 2021-05-29 NOTE — Patient Instructions (Addendum)
Please take your proton pump inhibitor medication 30 minutes to 1 hour before meals- this makes it more effective. CAN INCREASE TO TWICE A DAY OR DO THE PEPCID AT NIGHT.  ?STOP SODAS ?Keep the head of the bed elevated at least 3 inches with blocks or a wedge pillow if you are having any nighttime symptoms ? ?You have been scheduled for an endoscopy. Please follow written instructions given to you at your visit today. ?If you use inhalers (even only as needed), please bring them with you on the day of your procedure.  ? ?Due to recent changes in healthcare laws, you may see the results of your imaging and laboratory studies on MyChart before your provider has had a chance to review them.  We understand that in some cases there may be results that are confusing or concerning to you. Not all laboratory results come back in the same time frame and the provider may be waiting for multiple results in order to interpret others.  Please give [place] 48 hours in order for your provider to thoroughly review all the results before contacting the office for clarification of your results.   ? ? ? ? ?Avoid spicy and acidic foods ?Avoid fatty foods ?Limit your intake of coffee, tea, alcohol, and carbonated drinks ?Work to maintain a healthy weight ?Stay upright for 2 hours after eating ?Avoid meals and snacks three to four hours before bedtime ? ?Gastroesophageal Reflux Disease, Adult ?Gastroesophageal reflux (GER) happens when acid from the stomach flows up into the tube that connects the mouth and the stomach (esophagus). Normally, food travels down the esophagus and stays in the stomach to be digested. However, when a person has GER, food and stomach acid sometimes move back up into the esophagus. If this becomes a more serious problem, the person may be diagnosed with a disease called gastroesophageal reflux disease (GERD). GERD occurs when the reflux: ?Happens often. ?Causes frequent or severe symptoms. ?Causes problems such as  damage to the esophagus. ?When stomach acid comes in contact with the esophagus, the acid may cause inflammation in the esophagus. Over time, GERD may create small holes (ulcers) in the lining of the esophagus. ?What are the causes? ?This condition is caused by a problem with the muscle between the esophagus and the stomach (lower esophageal sphincter, or LES). Normally, the LES muscle closes after food passes through the esophagus to the stomach. When the LES is weakened or abnormal, it does not close properly, and that allows food and stomach acid to go back up into the esophagus. ?The LES can be weakened by certain dietary substances, medicines, and medical conditions, including: ?Tobacco use. ?Pregnancy. ?Having a hiatal hernia. ?Alcohol use. ?Certain foods and beverages, such as coffee, chocolate, onions, and peppermint. ?What increases the risk? ?You are more likely to develop this condition if you: ?Have an increased body weight. ?Have a connective tissue disorder. ?Take NSAIDs, such as ibuprofen. ?What are the signs or symptoms? ?Symptoms of this condition include: ?Heartburn. ?Difficult or painful swallowing and the feeling of having a lump in the throat. ?A bitter taste in the mouth. ?Bad breath and having a large amount of saliva. ?Having an upset or bloated stomach and belching. ?Chest pain. Different conditions can cause chest pain. Make sure you see your health care provider if you experience chest pain. ?Shortness of breath or wheezing. ?Ongoing (chronic) cough or a nighttime cough. ?Wearing away of tooth enamel. ?Weight loss. ?How is this diagnosed? ?This condition may be diagnosed  based on a medical history and a physical exam. To determine if you have mild or severe GERD, your health care provider may also monitor how you respond to treatment. You may also have tests, including: ?A test to examine your stomach and esophagus with a small camera (endoscopy). ?A test that measures the acidity level in  your esophagus. ?A test that measures how much pressure is on your esophagus. ?A barium swallow or modified barium swallow test to show the shape, size, and functioning of your esophagus. ?How is this treated? ?Treatment for this condition may vary depending on how severe your symptoms are. Your health care provider may recommend: ?Changes to your diet. ?Medicine. ?Surgery. ?The goal of treatment is to help relieve your symptoms and to prevent complications. ?Follow these instructions at home: ?Eating and drinking ? ?Follow a diet as recommended by your health care provider. This may involve avoiding foods and drinks such as: ?Coffee and tea, with or without caffeine. ?Drinks that contain alcohol. ?Energy drinks and sports drinks. ?Carbonated drinks or sodas. ?Chocolate and cocoa. ?Peppermint and mint flavorings. ?Garlic and onions. ?Horseradish. ?Spicy and acidic foods, including peppers, chili powder, curry powder, vinegar, hot sauces, and barbecue sauce. ?Citrus fruit juices and citrus fruits, such as oranges, lemons, and limes. ?Tomato-based foods, such as red sauce, chili, salsa, and pizza with red sauce. ?Fried and fatty foods, such as donuts, french fries, potato chips, and high-fat dressings. ?High-fat meats, such as hot dogs and fatty cuts of red and white meats, such as rib eye steak, sausage, ham, and bacon. ?High-fat dairy items, such as whole milk, butter, and cream cheese. ?Eat small, frequent meals instead of large meals. ?Avoid drinking large amounts of liquid with your meals. ?Avoid eating meals during the 2-3 hours before bedtime. ?Avoid lying down right after you eat. ?Do not exercise right after you eat. ?Lifestyle ? ?Do not use any products that contain nicotine or tobacco. These products include cigarettes, chewing tobacco, and vaping devices, such as e-cigarettes. If you need help quitting, ask your health care provider. ?Try to reduce your stress by using methods such as yoga or meditation.  If you need help reducing stress, ask your health care provider. ?If you are overweight, reduce your weight to an amount that is healthy for you. Ask your health care provider for guidance about a safe weight loss goal. ?General instructions ?Pay attention to any changes in your symptoms. ?Take over-the-counter and prescription medicines only as told by your health care provider. Do not take aspirin, ibuprofen, or other NSAIDs unless your health care provider told you to take these medicines. ?Wear loose-fitting clothing. Do not wear anything tight around your waist that causes pressure on your abdomen. ?Raise (elevate) the head of your bed about 6 inches (15 cm). You can use a wedge to do this. ?Avoid bending over if this makes your symptoms worse. ?Keep all follow-up visits. This is important. ?Contact a health care provider if: ?You have: ?New symptoms. ?Unexplained weight loss. ?Difficulty swallowing or it hurts to swallow. ?Wheezing or a persistent cough. ?A hoarse voice. ?Your symptoms do not improve with treatment. ?Get help right away if: ?You have sudden pain in your arms, neck, jaw, teeth, or back. ?You suddenly feel sweaty, dizzy, or light-headed. ?You have chest pain or shortness of breath. ?You vomit and the vomit is green, yellow, or black, or it looks like blood or coffee grounds. ?You faint. ?You have stool that is red, bloody, or black. ?You cannot  swallow, drink, or eat. ?These symptoms may represent a serious problem that is an emergency. Do not wait to see if the symptoms will go away. Get medical help right away. Call your local emergency services (911 in the [place] Do not drive yourself to the hospital. ?Summary ?Gastroesophageal reflux happens when acid from the stomach flows up into the esophagus. GERD is a disease in which the reflux happens often, causes frequent or severe symptoms, or causes problems such as damage to the esophagus. ?Treatment for this condition may vary depending on how  severe your symptoms are. Your health care provider may recommend diet and lifestyle changes, medicine, or surgery. ?Contact a health care provider if you have new or worsening symptoms. ?Take over-the-c

## 2021-06-07 ENCOUNTER — Encounter: Payer: Self-pay | Admitting: Gastroenterology

## 2021-06-07 ENCOUNTER — Ambulatory Visit (AMBULATORY_SURGERY_CENTER): Payer: Commercial Managed Care - PPO | Admitting: Gastroenterology

## 2021-06-07 VITALS — BP 124/74 | HR 87 | Temp 99.1°F | Resp 16 | Ht 70.0 in | Wt 244.0 lb

## 2021-06-07 DIAGNOSIS — K449 Diaphragmatic hernia without obstruction or gangrene: Secondary | ICD-10-CM

## 2021-06-07 DIAGNOSIS — K2100 Gastro-esophageal reflux disease with esophagitis, without bleeding: Secondary | ICD-10-CM | POA: Diagnosis present

## 2021-06-07 DIAGNOSIS — K219 Gastro-esophageal reflux disease without esophagitis: Secondary | ICD-10-CM

## 2021-06-07 MED ORDER — SODIUM CHLORIDE 0.9 % IV SOLN: 500.0000 mL | Freq: Once | INTRAVENOUS | Status: DC

## 2021-06-07 MED ORDER — OMEPRAZOLE 40 MG PO CPDR: 40.0000 mg | DELAYED_RELEASE_CAPSULE | Freq: Two times a day (BID) | ORAL | 6 refills | Status: DC

## 2021-06-07 NOTE — Progress Notes (Signed)
Pt's states no medical or surgical changes since previsit or office visit. 

## 2021-06-07 NOTE — Progress Notes (Signed)
Pt in recovery with monitors in place, VSS. Report given to receiving RN. Bite guard was placed with pt awake to ensure comfort. No dental or soft tissue damage noted. 

## 2021-06-07 NOTE — Progress Notes (Signed)
? ?GASTROENTEROLOGY PROCEDURE H&P NOTE  ? ?Primary Care Physician: ?Deatra James, MD ? ?HPI: ?Matthew Pace is a 31 y.o. male who presents for EGD for follow up of esophagitis and GERD symptoms with known hiatal hernia. ? ?Past Medical History:  ?Diagnosis Date  ? GERD (gastroesophageal reflux disease)   ? ?Past Surgical History:  ?Procedure Laterality Date  ? IR RADIOLOGIST EVAL & MGMT  07/27/2019  ? LAPAROSCOPIC APPENDECTOMY N/A 07/03/2019  ? Procedure: APPENDECTOMY LAPAROSCOPIC;  Surgeon: Romie Levee, MD;  Location: [facility];  Service: General;  Laterality: N/A;  ? MOUTH SURGERY    ? ?Current Outpatient Medications  ?Medication Sig Dispense Refill  ? calcium carbonate (TUMS - DOSED IN MG ELEMENTAL CALCIUM) 500 MG chewable tablet Chew 2 tablets by mouth daily.    ? doxycycline (VIBRAMYCIN) 100 MG capsule Take 1 capsule (100 mg total) by mouth 2 (two) times daily. 20 capsule 0  ? famotidine (PEPCID) 20 MG tablet TAKE 1 TABLET BY MOUTH EVERYDAY AT BEDTIME 90 tablet 3  ? omeprazole (PRILOSEC) 40 MG capsule Take 1 capsule (40 mg total) by mouth daily. 90 capsule 3  ? ?Current Facility-Administered Medications  ?Medication Dose Route Frequency Provider Last Rate Last Admin  ? 0.9 %  sodium chloride infusion  500 mL Intravenous Once Mansouraty, Netty Starring., MD      ? ? ?Current Outpatient Medications:  ?  calcium carbonate (TUMS - DOSED IN MG ELEMENTAL CALCIUM) 500 MG chewable tablet, Chew 2 tablets by mouth daily., Disp: , Rfl:  ?  doxycycline (VIBRAMYCIN) 100 MG capsule, Take 1 capsule (100 mg total) by mouth 2 (two) times daily., Disp: 20 capsule, Rfl: 0 ?  famotidine (PEPCID) 20 MG tablet, TAKE 1 TABLET BY MOUTH EVERYDAY AT BEDTIME, Disp: 90 tablet, Rfl: 3 ?  omeprazole (PRILOSEC) 40 MG capsule, Take 1 capsule (40 mg total) by mouth daily., Disp: 90 capsule, Rfl: 3 ? ?Current Facility-Administered Medications:  ?  0.9 %  sodium chloride infusion, 500 mL, Intravenous, Once, Mansouraty, Netty Starring., MD ?Allergies   ?Allergen Reactions  ? Vancomycin Nausea And Vomiting  ? ?Family History  ?Problem Relation Age of Onset  ? Diabetes Mother   ? Hypertension Mother   ? Diabetes Maternal Grandmother   ? Stroke Maternal Grandmother   ? Cancer Maternal Grandfather   ? Heart disease Paternal Grandfather   ? Stroke Paternal Grandfather   ? Colon cancer Neg Hx   ? Stomach cancer Neg Hx   ? Pancreatic cancer Neg Hx   ? Esophageal cancer Neg Hx   ? Liver disease Neg Hx   ? Rectal cancer Neg Hx   ? ?Social History  ? ?Socioeconomic History  ? Marital status: Single  ?  Spouse name: Not on file  ? Number of children: Not on file  ? Years of education: Not on file  ? Highest education level: Not on file  ?Occupational History  ? Not on file  ?Tobacco Use  ? Smoking status: Former  ?  Packs/day: 1.00  ?  Types: Cigarettes  ?  Quit date: 12/31/2015  ?  Years since quitting: 5.4  ? Smokeless tobacco: Never  ?Vaping Use  ? Vaping Use: Never used  ?Substance and Sexual Activity  ? Alcohol use: Yes  ?  Comment: social  ? Drug use: No  ? Sexual activity: Not Currently  ?Other Topics Concern  ? Not on file  ?Social History Narrative  ? Not on file  ? ?Social Determinants of Health  ? ?  Financial Resource Strain: Not on file  ?Food Insecurity: Not on file  ?Transportation Needs: Not on file  ?Physical Activity: Not on file  ?Stress: Not on file  ?Social Connections: Not on file  ?Intimate Partner Violence: Not on file  ? ? ?Physical Exam: ?Today's Vitals  ? 06/07/21 1508  ?BP: 117/73  ?Pulse: 83  ?Temp: 99.1 ?F (37.3 ?C)  ?TempSrc: Temporal  ?SpO2: 98%  ?Weight: 244 lb (110.7 kg)  ?Height: 5\' 10"  (1.778 m)  ? ?Body mass index is 35.01 kg/m?. ?GEN: NAD ?EYE: Sclerae anicteric ?ENT: MMM ?CV: Non-tachycardic ?GI: Soft, NT/ND ?NEURO:  Alert & Oriented x 3 ? ?Lab Results: ?No results for input(s): WBC, HGB, HCT, PLT in the last 72 hours. ?BMET ?No results for input(s): NA, K, CL, CO2, GLUCOSE, BUN, CREATININE, CALCIUM in the last 72 hours. ?LFT ?No results  for input(s): PROT, ALBUMIN, AST, ALT, ALKPHOS, BILITOT, BILIDIR, IBILI in the last 72 hours. ?PT/INR ?No results for input(s): LABPROT, INR in the last 72 hours. ? ? ?Impression / Plan: ?This is a 31 y.o.male who presents for EGD for follow up of esophagitis and GERD symptoms with known hiatal hernia. ? ?The risks and benefits of endoscopic evaluation/treatment were discussed with the patient and/or family; these include but are not limited to the risk of perforation, infection, bleeding, missed lesions, lack of diagnosis, severe illness requiring hospitalization, as well as anesthesia and sedation related illnesses.  The patient's history has been reviewed, patient examined, no change in status, and deemed stable for procedure.  The patient and/or family is agreeable to proceed.  ? ? ?37, MD ?[facility] ?Advanced Endoscopy ?Office # [name] ? ?

## 2021-06-07 NOTE — Patient Instructions (Signed)
Resume previous diet ?Increase Omeprazole to 40 mg twice daily ?May continue Pepcid at bedtime is necessary ?Repeat EGD in 4 months to ensure healing ?Return to clinic in 6 weeks ? ?YOU HAD AN ENDOSCOPIC PROCEDURE TODAY AT [facility]:   Refer to the procedure report that was given to you for any specific questions about what was found during the examination.  If the procedure report does not answer your questions, please call your gastroenterologist to clarify.  If you requested that your care partner not be given the details of your procedure findings, then the procedure report has been included in a sealed envelope for you to review at your convenience later. ? ?YOU SHOULD EXPECT: Some feelings of bloating in the abdomen. Passage of more gas than usual.  Walking can help get rid of the air that was put into your GI tract during the procedure and reduce the bloating. If you had a lower endoscopy (such as a colonoscopy or flexible sigmoidoscopy) you may notice spotting of blood in your stool or on the toilet paper. If you underwent a bowel prep for your procedure, you may not have a normal bowel movement for a few days. ? ?Please Note:  You might notice some irritation and congestion in your nose or some drainage.  This is from the oxygen used during your procedure.  There is no need for concern and it should clear up in a day or so. ? ?SYMPTOMS TO REPORT IMMEDIATELY: ? ? ?Following upper endoscopy (EGD) ? Vomiting of blood or coffee ground material ? New chest pain or pain under the shoulder blades ? Painful or persistently difficult swallowing ? New shortness of breath ? Fever of 100?F or higher ? Black, tarry-looking stools ? ?For urgent or emergent issues, a gastroenterologist can be reached at any hour by calling [phone]. ?Do not use MyChart messaging for urgent concerns.  ? ? ?DIET:  We do recommend a small meal at first, but then you may proceed to your regular diet.  Drink plenty of  fluids but you should avoid alcoholic beverages for 24 hours. ? ?ACTIVITY:  You should plan to take it easy for the rest of today and you should NOT DRIVE or use heavy machinery until tomorrow (because of the sedation medicines used during the test).   ? ?FOLLOW UP: ?Our staff will call the number listed on your records 48-72 hours following your procedure to check on you and address any questions or concerns that you may have regarding the information given to you following your procedure. If we do not reach you, we will leave a message.  We will attempt to reach you two times.  During this call, we will ask if you have developed any symptoms of COVID 19. If you develop any symptoms (ie: fever, flu-like symptoms, shortness of breath, cough etc.) before then, please call [phone].  If you test positive for Covid 19 in the 2 weeks post procedure, please call and report this information to [place].   ? ?If any biopsies were taken you will be contacted by phone or by letter within the next 1-3 weeks.  Please call us at [phone] if you have not heard about the biopsies in 3 weeks.  ? ? ?SIGNATURES/CONFIDENTIALITY: ?You and/or your care partner have signed paperwork which will be entered into your electronic medical record.  These signatures attest to the fact that that the information above on your After Visit Summary has been reviewed and  is understood.  Full responsibility of the confidentiality of this discharge information lies with you and/or your care-partner.  ?

## 2021-06-07 NOTE — Op Note (Signed)
[facility] ?Patient Name: Matthew Pace ?Procedure Date: 06/07/2021 4:03 PM ?MRN: [id number] ?Endoscopist: Justice Britain , MD ?Age: 31 ?Referring MD:  ?Date of Birth: October 24, 1990 ?Gender: Male ?Account #: [id number] ?Procedure:                Upper GI endoscopy ?Indications:              Esophageal reflux symptoms that recur despite  ?                          appropriate therapy, Follow-up of reflux esophagitis ?Medicines:                Monitored Anesthesia Care ?Procedure:                Pre-Anesthesia Assessment: ?                          - Prior to the procedure, a History and Physical  ?                          was performed, and patient medications and  ?                          allergies were reviewed. The patient's tolerance of  ?                          previous anesthesia was also reviewed. The risks  ?                          and benefits of the procedure and the sedation  ?                          options and risks were discussed with the patient.  ?                          All questions were answered, and informed consent  ?                          was obtained. Prior Anticoagulants: The patient has  ?                          taken no previous anticoagulant or antiplatelet  ?                          agents. ASA Grade Assessment: II - A patient with  ?                          mild systemic disease. After reviewing the risks  ?                          and benefits, the patient was deemed in  ?                          satisfactory condition to undergo the procedure. ?  After obtaining informed consent, the endoscope was  ?                          passed under direct vision. Throughout the  ?                          procedure, the patient's blood pressure, pulse, and  ?                          oxygen saturations were monitored continuously. The  ?                          GIF [id number] #[id number] was introduced through the  ?                          mouth,  and advanced to the second part of duodenum.  ?                          The upper GI endoscopy was accomplished without  ?                          difficulty. The patient tolerated the procedure. ?Scope In: ?Scope Out: ?Findings:                 No gross lesions were noted in the proximal  ?                          esophagus and in the mid esophagus. ?                          LA Grade B (one or more mucosal breaks greater than  ?                          5 mm, not extending between the tops of two mucosal  ?                          folds) esophagitis with no bleeding was found in  ?                          the distal esophagus. ?                          The Z-line was irregular and was found 36 cm from  ?                          the incisors. ?                          A 4 cm hiatal hernia was present. ?                          Normal mucosa was found in the entire examined  ?  stomach. ?                          No gross lesions were noted in the duodenal bulb,  ?                          in the first portion of the duodenum and in the  ?                          second portion of the duodenum. ?Complications:            No immediate complications. ?Estimated Blood Loss:     Estimated blood loss: none. ?Impression:               - No gross lesions in esophagus proximally. LA  ?                          Grade B reflux esophagitis with no bleeding  ?                          distally. Z-line irregular, 36 cm from the incisors. ?                          - 4 cm hiatal hernia. ?                          - Normal mucosa was found in the entire stomach. ?                          - No gross lesions in the duodenal bulb, in the  ?                          first portion of the duodenum and in the second  ?                          portion of the duodenum. ?Recommendation:           - The patient will be observed post-procedure,  ?                          until all discharge criteria are  met. ?                          - Discharge patient to home. ?                          - Patient has a contact number available for  ?                          emergencies. The signs and symptoms of potential  ?                          delayed complications were discussed with the  ?  patient. Return to normal activities tomorrow.  ?                          Written discharge instructions were provided to the  ?                          patient. ?                          - Resume previous diet. ?                          - Increase Omeprazole to 40 mg twice daily. ?                          - May continue Pepcid QHS if necessary. ?                          - Repeat EGD in 4 months to ensure healing. ?                          - Consider Manometry in future, if we are going to  ?                          consider Nissen + HH repair. ?                          - Return to clinic in 6-weeks. ?                          - The findings and recommendations were discussed  ?                          with the patient. ?                          - The findings and recommendations were discussed  ?                          with the patient's family. ?Justice Britain, MD ?06/07/2021 4:26:58 PM ?

## 2021-06-11 ENCOUNTER — Telehealth: Payer: Self-pay | Admitting: *Deleted

## 2021-06-11 ENCOUNTER — Telehealth: Payer: Self-pay

## 2021-06-11 NOTE — Telephone Encounter (Signed)
Attempted to call patient for their post-procedure follow-up call. No answer. Left voicemail.   

## 2021-06-11 NOTE — Telephone Encounter (Signed)
Left message on follow up call. 

## 2021-06-18 ENCOUNTER — Encounter: Payer: Commercial Managed Care - PPO | Admitting: Gastroenterology

## 2021-06-20 ENCOUNTER — Encounter: Payer: Commercial Managed Care - PPO | Admitting: Gastroenterology

## 2021-07-04 ENCOUNTER — Ambulatory Visit (INDEPENDENT_AMBULATORY_CARE_PROVIDER_SITE_OTHER): Payer: Commercial Managed Care - PPO | Admitting: Podiatry

## 2021-07-04 ENCOUNTER — Encounter: Payer: Self-pay | Admitting: Podiatry

## 2021-07-04 DIAGNOSIS — L301 Dyshidrosis [pompholyx]: Secondary | ICD-10-CM

## 2021-07-04 DIAGNOSIS — K449 Diaphragmatic hernia without obstruction or gangrene: Secondary | ICD-10-CM | POA: Insufficient documentation

## 2021-07-04 DIAGNOSIS — K219 Gastro-esophageal reflux disease without esophagitis: Secondary | ICD-10-CM | POA: Insufficient documentation

## 2021-07-04 DIAGNOSIS — F909 Attention-deficit hyperactivity disorder, unspecified type: Secondary | ICD-10-CM | POA: Insufficient documentation

## 2021-07-04 DIAGNOSIS — E6609 Other obesity due to excess calories: Secondary | ICD-10-CM | POA: Insufficient documentation

## 2021-07-04 MED ORDER — CLOBETASOL PROPIONATE 0.05 % EX CREA: 1.0000 "application " | TOPICAL_CREAM | Freq: Two times a day (BID) | CUTANEOUS | 2 refills | Status: DC

## 2021-07-04 NOTE — Progress Notes (Signed)
?  Subjective:  ?Patient ID: Matthew Pace, male    DOB: [birth year],  MRN: [id number] ?HPI ?Chief Complaint  ?Patient presents with  ? Skin Problem  ?  2nd toes bilateral and 3rd toe right - rash x several months, worsening, tried OTC meds, itches more when hot  ? New Patient (Initial Visit)  ? ? ?31 y.o. male presents with the above complaint.  ? ?ROS: Denies fever chills nausea vomiting muscle aches pains calf pain back pain chest pain shortness of breath. ? ?Past Medical History:  ?Diagnosis Date  ? GERD (gastroesophageal reflux disease)   ? ?Past Surgical History:  ?Procedure Laterality Date  ? IR RADIOLOGIST EVAL & MGMT  07/27/2019  ? LAPAROSCOPIC APPENDECTOMY N/A 07/03/2019  ? Procedure: APPENDECTOMY LAPAROSCOPIC;  Surgeon: Leighton Ruff, MD;  Location: [facility];  Service: General;  Laterality: N/A;  ? MOUTH SURGERY    ? ? ?Current Outpatient Medications:  ?  clobetasol cream (TEMOVATE) [id number] %, Apply 1 application. topically 2 (two) times daily., Disp: 30 g, Rfl: 2 ?  calcium carbonate (TUMS - DOSED IN MG ELEMENTAL CALCIUM) 500 MG chewable tablet, Chew 2 tablets by mouth daily., Disp: , Rfl:  ?  famotidine (PEPCID) 20 MG tablet, TAKE 1 TABLET BY MOUTH EVERYDAY AT BEDTIME, Disp: 90 tablet, Rfl: 3 ?  omeprazole (PRILOSEC) 40 MG capsule, Take 1 capsule (40 mg total) by mouth 2 (two) times daily., Disp: 90 capsule, Rfl: 6 ? ?Allergies  ?Allergen Reactions  ? Vancomycin Nausea And Vomiting  ? Lincomycin   ?  Other reaction(s): nausea and vomiting  ? ?Review of Systems ?Objective:  ?There were no vitals filed for this visit. ? ?General: Well developed, nourished, in no acute distress, alert and oriented x3  ? ?Dermatological: Skin is warm, dry and supple bilateral. Nails x 10 are well maintained; remaining integument appears unremarkable at this time. There are no open sores, no preulcerative lesions, no rash or signs of infection present.  Toes particular #2 #3 of the right foot demonstrate a dry skin with a tapioca  appearance of bubbles beneath the skin.  There is no erythema no petechial lesions.  Mild hyperpigmentation. ? ?Vascular: Dorsalis Pedis artery and Posterior Tibial artery pedal pulses are 2/4 bilateral with immedate capillary fill time. Pedal hair growth present. No varicosities and no lower extremity edema present bilateral.  ? ?Neruologic: Grossly intact via light touch bilateral. Vibratory intact via tuning fork bilateral. Protective threshold with Semmes Wienstein monofilament intact to all pedal sites bilateral. Patellar and Achilles deep tendon reflexes 2+ bilateral. No Babinski or clonus noted bilateral.  ? ?Musculoskeletal: No gross boney pedal deformities bilateral. No pain, crepitus, or limitation noted with foot and ankle range of motion bilateral. Muscular strength 5/5 in all groups tested bilateral. ? ?Gait: Unassisted, Nonantalgic.  ? ? ?Radiographs: ? ?None taken ? ?Assessment & Plan:  ? ?Assessment: Dyshidrotic eczema ? ?Plan: Started him on Temovate and cool Epsom salt compresses. ? ? ? ? ?Lashunda Greis T. [place], DPM ?

## 2021-07-09 ENCOUNTER — Encounter: Payer: Commercial Managed Care - PPO | Admitting: Gastroenterology

## 2021-12-02 ENCOUNTER — Encounter: Payer: Self-pay | Admitting: Gastroenterology

## 2022-04-27 ENCOUNTER — Other Ambulatory Visit: Payer: Self-pay | Admitting: Gastroenterology

## 2022-06-21 ENCOUNTER — Other Ambulatory Visit: Payer: Self-pay | Admitting: Nurse Practitioner

## 2022-06-30 ENCOUNTER — Telehealth: Payer: Self-pay | Admitting: Physician Assistant

## 2022-06-30 MED ORDER — OMEPRAZOLE 40 MG PO CPDR: 40.0000 mg | DELAYED_RELEASE_CAPSULE | Freq: Two times a day (BID) | ORAL | 3 refills | Status: DC

## 2022-06-30 NOTE — Telephone Encounter (Signed)
Return call to patient. Advised that he should be taking Omeprazole 40 mg BID.Patient voiced understanding. Refill sent to patients pharmacy.

## 2022-06-30 NOTE — Telephone Encounter (Signed)
Patient called stating he needs a refill early for his Omeprazole. States he got the dosage wrong, instead of 2 times daily he has been taking it 2 times in the morning and 2 times at night. States his pharmacy needs confirmation for them to refill it early. Please advise.

## 2022-06-30 NOTE — Telephone Encounter (Signed)
Ro it looks like you may have filled this script for pt in April.

## 2022-07-24 ENCOUNTER — Other Ambulatory Visit: Payer: Self-pay | Admitting: Nurse Practitioner

## 2022-08-15 ENCOUNTER — Ambulatory Visit: Payer: Commercial Managed Care - PPO | Admitting: Nurse Practitioner

## 2022-08-22 ENCOUNTER — Encounter: Payer: Self-pay | Admitting: Podiatry

## 2022-08-22 ENCOUNTER — Ambulatory Visit (INDEPENDENT_AMBULATORY_CARE_PROVIDER_SITE_OTHER): Payer: Commercial Managed Care - PPO | Admitting: Gastroenterology

## 2022-08-22 ENCOUNTER — Ambulatory Visit (INDEPENDENT_AMBULATORY_CARE_PROVIDER_SITE_OTHER): Payer: Commercial Managed Care - PPO | Admitting: Podiatry

## 2022-08-22 ENCOUNTER — Encounter: Payer: Self-pay | Admitting: Gastroenterology

## 2022-08-22 VITALS — BP 114/78 | HR 75 | Ht 70.0 in | Wt 250.0 lb

## 2022-08-22 DIAGNOSIS — K449 Diaphragmatic hernia without obstruction or gangrene: Secondary | ICD-10-CM | POA: Diagnosis not present

## 2022-08-22 DIAGNOSIS — R112 Nausea with vomiting, unspecified: Secondary | ICD-10-CM | POA: Insufficient documentation

## 2022-08-22 DIAGNOSIS — K208 Other esophagitis without bleeding: Secondary | ICD-10-CM

## 2022-08-22 DIAGNOSIS — K219 Gastro-esophageal reflux disease without esophagitis: Secondary | ICD-10-CM | POA: Diagnosis not present

## 2022-08-22 DIAGNOSIS — L600 Ingrowing nail: Secondary | ICD-10-CM | POA: Diagnosis not present

## 2022-08-22 DIAGNOSIS — R490 Dysphonia: Secondary | ICD-10-CM | POA: Insufficient documentation

## 2022-08-22 MED ORDER — FAMOTIDINE 20 MG PO TABS: 20.0000 mg | ORAL_TABLET | Freq: Every day | ORAL | 3 refills | Status: DC

## 2022-08-22 MED ORDER — PANTOPRAZOLE SODIUM 40 MG PO TBEC: 40.0000 mg | DELAYED_RELEASE_TABLET | Freq: Two times a day (BID) | ORAL | 3 refills | Status: DC

## 2022-08-22 NOTE — Progress Notes (Signed)
Attending Physician's Attestation   I have reviewed the chart.   I agree with the Advanced Practitioner's note, impression, and recommendations with any updates as below.    Jadaya Sommerfield Mansouraty, MD [facility] Office # [phone]  

## 2022-08-22 NOTE — Patient Instructions (Signed)
We have sent the following medications to your pharmacy for you to pick up at your convenience: Pantoprazole 40 mg twice daily 30-60 minutes before breakfast and dinner. Pepcid 20 mg nightly.  You have been scheduled for an endoscopy. Please follow written instructions given to you at your visit today. If you use inhalers (even only as needed), please bring them with you on the day of your procedure.  _______________________________________________________  If your blood pressure at your visit was 140/90 or greater, please contact your primary care physician to follow up on this.  _______________________________________________________  If you are age 67 or older, your body mass index should be between 23-30. Your Body mass index is 35.87 kg/m. If this is out of the aforementioned range listed, please consider follow up with your Primary Care Provider.  If you are age 66 or younger, your body mass index should be between 19-25. Your Body mass index is 35.87 kg/m. If this is out of the aformentioned range listed, please consider follow up with your Primary Care Provider.   ________________________________________________________  The [facility] providers would like to encourage you to use [facility] to communicate with providers for non-urgent requests or questions.  Due to long hold times on the telephone, sending your provider a message by [facility] may be a faster and more efficient way to get a response.  Please allow 48 business hours for a response.  Please remember that this is for non-urgent requests.  _______________________________________________________

## 2022-08-22 NOTE — Progress Notes (Addendum)
08/22/2022 Matthew Pace [id number] Oct 04, 1990   HISTORY OF PRESENT ILLNESS: This is a 32 year old male who is a patient of Dr. Elesa Hacker.  He has acid reflux with associated esophagitis, grade B, on his last endoscopy last year.  He also has a 4-5 cm hiatal hernia.  He is on omeprazole 40 mg twice daily and Pepcid 20 mg at bedtime.  He needs his prescriptions refilled.  He says that his symptoms are still present, and not near 100% with the medications.  He was supposed have a repeat endoscopy a few months following the last one to reassess for healing.  There was mention of needing an esophageal manometry with consideration for fundoplication and hiatal hernia repair as well.  Of note, he did take Dexilant in the past and it worked fairly well, not 100%, but better than his omeprazole, but became very expensive so he eventually had to discontinue it.   Past Medical History:  Diagnosis Date   GERD (gastroesophageal reflux disease)    Past Surgical History:  Procedure Laterality Date   IR RADIOLOGIST EVAL & MGMT  07/27/2019   LAPAROSCOPIC APPENDECTOMY N/A 07/03/2019   Procedure: APPENDECTOMY LAPAROSCOPIC;  Surgeon: Romie Levee, MD;  Location: [facility];  Service: General;  Laterality: N/A;   MOUTH SURGERY      reports that he quit smoking about 6 years ago. His smoking use included cigarettes. He smoked an average of 1 pack per day. He has never used smokeless tobacco. He reports current alcohol use. He reports that he does not use drugs. family history includes Cancer in his maternal grandfather; Diabetes in his maternal grandmother and mother; Heart disease in his paternal grandfather; Hypertension in his mother; Stroke in his maternal grandmother and paternal grandfather; Throat cancer in his maternal grandfather and another family member. Allergies  Allergen Reactions   Vancomycin Nausea And Vomiting   Lincomycin     Other reaction(s): nausea and vomiting      Outpatient  Encounter Medications as of 08/22/2022  Medication Sig   famotidine (PEPCID) 20 MG tablet TAKE 1 TABLET BY MOUTH EVERYDAY AT BEDTIME   omeprazole (PRILOSEC) 40 MG capsule Take 1 capsule (40 mg total) by mouth 2 (two) times daily.   [DISCONTINUED] calcium carbonate (TUMS - DOSED IN MG ELEMENTAL CALCIUM) 500 MG chewable tablet Chew 2 tablets by mouth daily. (Patient not taking: Reported on 08/22/2022)   [DISCONTINUED] clobetasol cream (TEMOVATE) 0.05 % Apply 1 application. topically 2 (two) times daily.   [DISCONTINUED] meclizine (ANTIVERT) 25 MG tablet Take by mouth. (Patient not taking: Reported on 08/22/2022)   No facility-administered encounter medications on file as of 08/22/2022.    REVIEW OF SYSTEMS  : All other systems reviewed and negative except where noted in the History of Present Illness.   PHYSICAL EXAM: BP 114/78   Pulse 75   Ht 5\' 10"  (1.778 m)   Wt 250 lb (113.4 kg)   SpO2 97%   BMI 35.87 kg/m  General: Well developed white male in no acute distress Head: Normocephalic and atraumatic Eyes:  Sclerae anicteric, conjunctiva pink. Ears: Normal auditory acuity Lungs: Clear throughout to auscultation; no W/R/R. Heart: Regular rate and rhythm; no M/R/G. Musculoskeletal: Symmetrical with no gross deformities  Skin: No lesions on visible extremities Extremities: No edema  Neurological: Alert oriented x 4, grossly non-focal Psychological:  Alert and cooperative. Normal mood and affect  ASSESSMENT AND PLAN: *GERD with esophagitis and 4 to 5 cm hiatal hernia: Has been on omeprazole  40 mg twice daily and Pepcid 20 mg at bedtime.  Still without complete relief of his symptoms.  He was supposed to have another endoscopy last year to evaluate for healing of his esophagitis.  At this point I think it may be worth trying a different PPI.  Will switch him to pantoprazole 40 mg twice daily and have him continue Pepcid 20 mg at bedtime.  Prescriptions sent to pharmacy.  Pending his response to  that and the results of his repeat endoscopy which is being scheduled with Dr. Meridee Score, I think that he may need to consider esophageal manometry and consideration of Nissen fundoplication plus hiatal hernia repair.  **The risks, benefits, and alternatives to EGD were discussed with the patient and he consents to proceed.   CC:  Deatra James, MD

## 2022-08-22 NOTE — Patient Instructions (Signed)

## 2022-08-25 ENCOUNTER — Ambulatory Visit: Payer: Commercial Managed Care - PPO | Admitting: Podiatry

## 2022-08-25 NOTE — Progress Notes (Signed)
Subjective:   Patient ID: Matthew Pace, male   DOB: 32 y.o.   MRN: [id number]   HPI Patient states he has developed a painful ingrown toenail of his left big toe stating it has been hurting for around a month it has been red and swollen he has tried trimming with no relief of symptoms.  Patient also has tried soaks   ROS      Objective:  Physical Exam  Neurovascular status intact with incurvated lateral border left big toe painful when pressed localized irritation of the tissue no active drainage noted     Assessment:  Ingrown toenail deformity left hallux lateral border with pain     Plan:  H&P reviewed recommended correction explained procedure risk patient wants surgery and today I went ahead and I infiltrated the left big toe after he read and signed consent form 60 mg Xylocaine Marcaine mixture sterile prep done and using sterile instrumentation I remove the lateral border I exposed matrix I applied phenol 3 applications 30 seconds followed by alcohol lavage sterile dressing I gave instructions on soaks and wearing dressing for 24 hours take it off earlier if throbbing were to occur and encouraged to call questions concerns which may arise

## 2022-10-07 ENCOUNTER — Encounter: Payer: Self-pay | Admitting: Gastroenterology

## 2022-10-07 ENCOUNTER — Ambulatory Visit: Payer: Commercial Managed Care - PPO | Admitting: Gastroenterology

## 2022-10-07 VITALS — BP 119/78 | HR 80 | Temp 99.3°F | Resp 19 | Ht 70.0 in | Wt 250.0 lb

## 2022-10-07 DIAGNOSIS — K449 Diaphragmatic hernia without obstruction or gangrene: Secondary | ICD-10-CM

## 2022-10-07 DIAGNOSIS — K219 Gastro-esophageal reflux disease without esophagitis: Secondary | ICD-10-CM | POA: Diagnosis not present

## 2022-10-07 DIAGNOSIS — K208 Other esophagitis without bleeding: Secondary | ICD-10-CM | POA: Diagnosis not present

## 2022-10-07 MED ORDER — SODIUM CHLORIDE 0.9 % IV SOLN
500.0000 mL | Freq: Once | INTRAVENOUS | Status: DC
Start: 2022-10-07 — End: 2022-10-07

## 2022-10-07 MED ORDER — RABEPRAZOLE SODIUM 20 MG PO TBEC
20.0000 mg | DELAYED_RELEASE_TABLET | Freq: Two times a day (BID) | ORAL | 6 refills | Status: DC
Start: 2022-10-07 — End: 2023-03-04

## 2022-10-07 NOTE — Progress Notes (Signed)
GASTROENTEROLOGY PROCEDURE H&P NOTE   Primary Care Physician: Deatra James, MD  HPI: Matthew Pace is a 32 y.o. male who presents for EGD for followup GERD with esophagitis and persisting symptoms on PPI therapy.  Past Medical History:  Diagnosis Date   GERD (gastroesophageal reflux disease)    Past Surgical History:  Procedure Laterality Date   IR RADIOLOGIST EVAL & MGMT  07/27/2019   LAPAROSCOPIC APPENDECTOMY N/A 07/03/2019   Procedure: APPENDECTOMY LAPAROSCOPIC;  Surgeon: Romie Levee, MD;  Location: [facility];  Service: General;  Laterality: N/A;   MOUTH SURGERY     Current Outpatient Medications  Medication Sig Dispense Refill   famotidine (PEPCID) 20 MG tablet Take 1 tablet (20 mg total) by mouth at bedtime. 90 tablet 3   pantoprazole (PROTONIX) 40 MG tablet Take 1 tablet (40 mg total) by mouth 2 (two) times daily before a meal. 180 tablet 3   No current facility-administered medications for this visit.    Current Outpatient Medications:    famotidine (PEPCID) 20 MG tablet, Take 1 tablet (20 mg total) by mouth at bedtime., Disp: 90 tablet, Rfl: 3   pantoprazole (PROTONIX) 40 MG tablet, Take 1 tablet (40 mg total) by mouth 2 (two) times daily before a meal., Disp: 180 tablet, Rfl: 3 Allergies  Allergen Reactions   Vancomycin Nausea And Vomiting   Lincomycin     Other reaction(s): nausea and vomiting   Family History  Problem Relation Age of Onset   Diabetes Mother    Hypertension Mother    Diabetes Maternal Grandmother    Stroke Maternal Grandmother    Cancer Maternal Grandfather    Throat cancer Maternal Grandfather    Heart disease Paternal Grandfather    Stroke Paternal Grandfather    Throat cancer Other    Colon cancer Neg Hx    Stomach cancer Neg Hx    Pancreatic cancer Neg Hx    Esophageal cancer Neg Hx    Liver disease Neg Hx    Rectal cancer Neg Hx    Social History   Socioeconomic History   Marital status: Married    Spouse name: Not on file    Number of children: Not on file   Years of education: Not on file   Highest education level: Not on file  Occupational History   Not on file  Tobacco Use   Smoking status: Former    Current packs/day: 0.00    Types: Cigarettes    Quit date: 12/31/2015    Years since quitting: 6.7   Smokeless tobacco: Never  Vaping Use   Vaping status: Never Used  Substance and Sexual Activity   Alcohol use: Yes    Comment: social   Drug use: No   Sexual activity: Not Currently  Other Topics Concern   Not on file  Social History Narrative   Not on file   Social Determinants of Health   Financial Resource Strain: Not on file  Food Insecurity: Not on file  Transportation Needs: Not on file  Physical Activity: Not on file  Stress: Not on file  Social Connections: Not on file  Intimate Partner Violence: Not on file    Physical Exam: There were no vitals filed for this visit. There is no height or weight on file to calculate BMI. GEN: NAD EYE: Sclerae anicteric ENT: MMM CV: Non-tachycardic GI: Soft, NT/ND NEURO:  Alert & Oriented x 3  Lab Results: No results for input(s): "WBC", "HGB", "HCT", "PLT" in the last  72 hours. BMET No results for input(s): "NA", "K", "CL", "CO2", "GLUCOSE", "BUN", "CREATININE", "CALCIUM" in the last 72 hours. LFT No results for input(s): "PROT", "ALBUMIN", "AST", "ALT", "ALKPHOS", "BILITOT", "BILIDIR", "IBILI" in the last 72 hours. PT/INR No results for input(s): "LABPROT", "INR" in the last 72 hours.   Impression / Plan: This is a 31 y.o.male who presents for EGD for followup GERD with esophagitis and persisting symptoms on PPI therapy.  The risks and benefits of endoscopic evaluation/treatment were discussed with the patient and/or family; these include but are not limited to the risk of perforation, infection, bleeding, missed lesions, lack of diagnosis, severe illness requiring hospitalization, as well as anesthesia and sedation related illnesses.   The patient's history has been reviewed, patient examined, no change in status, and deemed stable for procedure.  The patient and/or family is agreeable to proceed.    [name], MD [facility] Office # [phone]

## 2022-10-07 NOTE — Patient Instructions (Addendum)
Await pathology results.  Resume previous diet. Continue present medications.  A prescription for Aciphex 20mg  , twice daily has been sent to your pharmacy.  Handout on hiatal hernia provided.  YOU HAD AN ENDOSCOPIC PROCEDURE TODAY AT THE [facility]:   Refer to the procedure report that was given to you for any specific questions about what was found during the examination.  If the procedure report does not answer your questions, please call your gastroenterologist to clarify.  If you requested that your care partner not be given the details of your procedure findings, then the procedure report has been included in a sealed envelope for you to review at your convenience later.  YOU SHOULD EXPECT: Some feelings of bloating in the abdomen. Passage of more gas than usual.  Walking can help get rid of the air that was put into your GI tract during the procedure and reduce the bloating. If you had a lower endoscopy (such as a colonoscopy or flexible sigmoidoscopy) you may notice spotting of blood in your stool or on the toilet paper. If you underwent a bowel prep for your procedure, you may not have a normal bowel movement for a few days.  Please Note:  You might notice some irritation and congestion in your nose or some drainage.  This is from the oxygen used during your procedure.  There is no need for concern and it should clear up in a day or so.  SYMPTOMS TO REPORT IMMEDIATELY:  Following upper endoscopy (EGD)  Vomiting of blood or coffee ground material  New chest pain or pain under the shoulder blades  Painful or persistently difficult swallowing  New shortness of breath  Fever of 100F or higher  Black, tarry-looking stools  For urgent or emergent issues, a gastroenterologist can be reached at any hour by calling [phone]. Do not use MyChart messaging for urgent concerns.    DIET:  We do recommend a small meal at first, but then you may proceed to your regular  diet.  Drink plenty of fluids but you should avoid alcoholic beverages for 24 hours.  ACTIVITY:  You should plan to take it easy for the rest of today and you should NOT DRIVE or use heavy machinery until tomorrow (because of the sedation medicines used during the test).    FOLLOW UP: Our staff will call the number listed on your records the next business day following your procedure.  We will call around 7:15- 8:00 am to check on you and address any questions or concerns that you may have regarding the information given to you following your procedure. If we do not reach you, we will leave a message.     If any biopsies were taken you will be contacted by phone or by letter within the next 1-3 weeks.  Please call us at [phone] if you have not heard about the biopsies in 3 weeks.    SIGNATURES/CONFIDENTIALITY: You and/or your care partner have signed paperwork which will be entered into your electronic medical record.  These signatures attest to the fact that that the information above on your After Visit Summary has been reviewed and is understood.  Full responsibility of the confidentiality of this discharge information lies with you and/or your care-partner.

## 2022-10-07 NOTE — Progress Notes (Signed)
Uneventful anesthetic. Report to pacu rn. Vss. Care resumed by rn. 

## 2022-10-07 NOTE — Progress Notes (Signed)
Called to room to assist during endoscopic procedure.  Patient ID and intended procedure confirmed with present staff. Received instructions for my participation in the procedure from the performing physician.  

## 2022-10-07 NOTE — Progress Notes (Signed)
Pt's states no medical or surgical changes since previsit or office visit. 

## 2022-10-07 NOTE — Op Note (Signed)
[facility] Patient Name: Matthew Pace Procedure Date: 10/07/2022 4:00 PM MRN: [id number] Endoscopist: [name] , MD, [id number] Age: 32 Referring MD:  Date of Birth: 1990/11/18 Gender: Male Account #: [id number] Procedure:                Upper GI endoscopy Indications:              Esophageal reflux symptoms that persist despite                            appropriate therapy, Follow-up of esophagitis Medicines:                Monitored Anesthesia Care Procedure:                Pre-Anesthesia Assessment:                           - Prior to the procedure, a History and Physical                            was performed, and patient medications and                            allergies were reviewed. The patient's tolerance of                            previous anesthesia was also reviewed. The risks                            and benefits of the procedure and the sedation                            options and risks were discussed with the patient.                            All questions were answered, and informed consent                            was obtained. Prior Anticoagulants: The patient has                            taken no anticoagulant or antiplatelet agents. ASA                            Grade Assessment: II - A patient with mild systemic                            disease. After reviewing the risks and benefits,                            the patient was deemed in satisfactory condition to                            undergo the procedure.  After obtaining informed consent, the endoscope was                            passed under direct vision. Throughout the                            procedure, the patient's blood pressure, pulse, and                            oxygen saturations were monitored continuously. The                            GIF [id number] #[id number] was introduced through the                            mouth,  and advanced to the second part of duodenum.                            The upper GI endoscopy was accomplished without                            difficulty. The patient tolerated the procedure. Scope In: Scope Out: Findings:                 No gross lesions were noted in the entire esophagus.                           The Z-line was irregular and was found 35 cm from                            the incisors.                           A 5 cm hiatal hernia was present.                           Multiple localized small erosions with no bleeding                            and no stigmata of recent bleeding were found in                            the gastric antrum.                           No other gross lesions were noted in the entire                            examined stomach. Biopsies were taken with a cold                            forceps for histology and Helicobacter pylori  testing.                           No gross lesions were noted in the duodenal bulb,                            in the first portion of the duodenum and in the                            second portion of the duodenum. Complications:            No immediate complications. Estimated Blood Loss:     Estimated blood loss was minimal. Impression:               - No gross lesions in the entire esophagus. Z-line                            irregular, 35 cm from the incisors.                           - 5 cm hiatal hernia.                           - Erosive gastropathy in the antrum with no                            bleeding and no stigmata of recent bleeding.                           - No other gross lesions in the entire stomach.                            Biopsied.                           - No gross lesions in the duodenal bulb, in the                            first portion of the duodenum and in the second                            portion of the duodenum. Recommendation:            - The patient will be observed post-procedure,                            until all discharge criteria are met.                           - Discharge patient to home.                           - Patient has a contact number available for                            emergencies. The  signs and symptoms of potential                            delayed complications were discussed with the                            patient. Return to normal activities tomorrow.                            Written discharge instructions were provided to the                            patient.                           - Resume previous diet.                           - Continue present medications.                           - Consider transition to Aciphex versus Voquenza.                           - I think the patient's persisting symptoms of GERD                            are result of his hiatal hernia. If he is                            interested in considering surgical workup or                            management of this, we can certainly move forward                            with a referral and we can work on setting up an                            esophageal manometry though he has no dysphagia                            symptoms.                           - The findings and recommendations were discussed                            with the patient.                           - The findings and recommendations were discussed                            with the designated responsible adult. [name], MD 10/07/2022 4:30:20 PM

## 2022-10-08 ENCOUNTER — Telehealth: Payer: Self-pay | Admitting: *Deleted

## 2022-10-08 NOTE — Telephone Encounter (Signed)
I called and spoke with the patient and told him about good Rx and found that it look like he should be able to get this at a much more cost effective means through that application.  He is going to follow-up with the pharmacy and with his insurance.  Otherwise for now we will plan to continue Aciphex 20 mg twice daily. He is aware and willing to consider surgical options in the future but wants to start with medicines first. He will keep [place] up-to-date. Will see what the pathology results show to determine follow-up timing. Thanks. GM

## 2022-10-08 NOTE — Telephone Encounter (Signed)
Dr Meridee Score see the message from the pt and advise

## 2022-10-08 NOTE — Telephone Encounter (Signed)
  Follow up Call-     10/07/2022    3:23 PM 06/07/2021    3:10 PM 02/28/2020   10:01 AM  Call back number  Post procedure Call Back phone  # [phone]  Permission to leave phone message Yes Yes Yes     Patient questions:  Do you have a fever, pain , or abdominal swelling? No. Pain Score  0 *  Have you tolerated food without any problems? Yes.    Have you been able to return to your normal activities? Yes.    Do you have any questions about your discharge instructions: Diet   No. Medications  No. Follow up visit  No.  Do you have questions or concerns about your Care? No.  Actions: * If pain score is 4 or above: No action needed, pain <4.

## 2022-10-13 ENCOUNTER — Encounter: Payer: Self-pay | Admitting: Gastroenterology

## 2023-02-03 ENCOUNTER — Telehealth: Payer: Self-pay | Admitting: Gastroenterology

## 2023-02-03 NOTE — Telephone Encounter (Signed)
The pt states that he is ready to proceed with referral to [facility] for hiatal hernia repair.  See note below from last EGD.  Referral has been made with records faxed.       I think the patient's persisting symptoms of GERD                            are result of his hiatal hernia. If he is                            interested in considering surgical workup or                            management of this, we can certainly move forward                            with a referral and we can work on setting up an                            esophageal manometry though he has no dysphagia                            symptoms.

## 2023-02-03 NOTE — Telephone Encounter (Signed)
PT has concerns about his hiatal hernia. He would like to discuss the issues. Please advise.

## 2023-02-17 ENCOUNTER — Ambulatory Visit: Payer: Self-pay | Admitting: General Surgery

## 2023-02-17 NOTE — H&P (Signed)
Chief Complaint: New Consultation (Hiatal hernia)       History of Present Illness: Matthew Pace is a 32 y.o. male who is seen today as an office consultation at the request of Dr. Meridee Score for evaluation of New Consultation (Hiatal hernia) .     Patient is a 32 year old male who comes in secondary to hiatal hernia, reflux.  He states he has been dealing with severe reflux over the last 5 years.  He states that he has had several prescriptions for medications that he maximizes and become ineffective.  He states that he has been taking steps to minimize the reflux by sleeping on a wedge, decreasing p.o. intake after certain time.  He states that he has had no significant pulmonary infections.   Patient has undergone endoscopy which revealed a 5 cm hiatal hernia.  He had no CT imaging.   He had a previous lap appendectomy in 2021.         Review of Systems: A complete review of systems was obtained from the patient.  I have reviewed this information and discussed as appropriate with the patient.  See HPI as well for other ROS.   Review of Systems  Constitutional:  Negative for fever.  HENT:  Negative for congestion.   Eyes:  Negative for blurred vision.  Respiratory:  Negative for cough, shortness of breath and wheezing.   Cardiovascular:  Negative for chest pain and palpitations.  Gastrointestinal:  Positive for heartburn.  Genitourinary:  Negative for dysuria.  Musculoskeletal:  Negative for myalgias.  Skin:  Negative for rash.  Neurological:  Negative for dizziness and headaches.  Psychiatric/Behavioral:  Negative for depression and suicidal ideas.   All other systems reviewed and are negative.       Medical History: Past Medical History Past Medical History: Diagnosis Date  GERD (gastroesophageal reflux disease)         Problem List There is no problem list on file for this patient.     Past Surgical History Past Surgical  History: Procedure Laterality Date  APPENDECTOMY          Allergies Allergies Allergen Reactions  Vancomycin Nausea And Vomiting  Lincomycin Nausea     Other reaction(s): nausea and vomiting      Medications Ordered Prior to Encounter Current Outpatient Medications on File Prior to Visit Medication Sig Dispense Refill  famotidine (PEPCID) 20 MG tablet Take 20 mg by mouth at bedtime      RABEprazole (ACIPHEX) 20 mg EC tablet Take 20 mg by mouth 2 (two) times daily        No current facility-administered medications on file prior to visit.      Family History Family History Problem Relation Age of Onset  High blood pressure (Hypertension) Mother    Diabetes Mother        Tobacco Use History Social History    Tobacco Use Smoking Status Former  Types: Cigarettes Smokeless Tobacco Never      Social History Social History    Socioeconomic History  Marital status: Married Tobacco Use  Smoking status: Former     Types: Cigarettes  Smokeless tobacco: Never Substance and Sexual Activity  Alcohol use: Yes  Drug use: Never      Objective:     Vitals:   02/17/23 1326 BP: 127/83 Pulse: 69 Temp: 36.7 C (98 F) SpO2: 98% Weight: (!) 108.9 kg (240 lb) Height: 177.8 cm (5\' 10" )   Body mass index is 34.44 kg/m.   Physical Exam  Constitutional:      General: He is not in acute distress.    Appearance: Normal appearance.  HENT:     Head: Normocephalic.     Nose: No rhinorrhea.     Mouth/Throat:     Mouth: Mucous membranes are moist.     Pharynx: Oropharynx is clear.  Eyes:     General: No scleral icterus.    Pupils: Pupils are equal, round, and reactive to light.  Cardiovascular:     Rate and Rhythm: Normal rate.     Pulses: Normal pulses.  Pulmonary:     Effort: Pulmonary effort is normal. No respiratory distress.     Breath sounds: No stridor. No wheezing.  Abdominal:     General: Abdomen is flat. There is no distension.     Tenderness:  There is no abdominal tenderness. There is no guarding or rebound.  Musculoskeletal:        General: Normal range of motion.     Cervical back: Normal range of motion and neck supple.  Skin:    General: Skin is warm and dry.     Capillary Refill: Capillary refill takes less than 2 seconds.     Coloration: Skin is not jaundiced.  Neurological:     General: No focal deficit present.     Mental Status: He is alert and oriented to person, place, and time. Mental status is at baseline.  Psychiatric:        Mood and Affect: Mood normal.        Thought Content: Thought content normal.        Judgment: Judgment normal.          Assessment and Plan: Diagnoses and all orders for this visit:   Hiatal hernia     Matthew Pace is a 32 y.o. male    1.  We will proceed to the OR for a robotic hiatal hernia repair with mesh and fundoplication 2. All risks and benefits were discussed with the patient, to generally include infection, bleeding, damage to surrounding structures, possible pneumothorax, and recurrence. Alternatives were offered and described.  All questions were answered and the patient voiced understanding of the procedure and wishes to proceed at this point.

## 2023-02-17 NOTE — H&P (View-Only) (Signed)
 Chief Complaint: New Consultation (Hiatal hernia)       History of Present Illness: Matthew Pace is a 32 y.o. male who is seen today as an office consultation at the request of Dr. Meridee Score for evaluation of New Consultation (Hiatal hernia) .     Patient is a 32 year old male who comes in secondary to hiatal hernia, reflux.  He states he has been dealing with severe reflux over the last 5 years.  He states that he has had several prescriptions for medications that he maximizes and become ineffective.  He states that he has been taking steps to minimize the reflux by sleeping on a wedge, decreasing p.o. intake after certain time.  He states that he has had no significant pulmonary infections.   Patient has undergone endoscopy which revealed a 5 cm hiatal hernia.  He had no CT imaging.   He had a previous lap appendectomy in 2021.         Review of Systems: A complete review of systems was obtained from the patient.  I have reviewed this information and discussed as appropriate with the patient.  See HPI as well for other ROS.   Review of Systems  Constitutional:  Negative for fever.  HENT:  Negative for congestion.   Eyes:  Negative for blurred vision.  Respiratory:  Negative for cough, shortness of breath and wheezing.   Cardiovascular:  Negative for chest pain and palpitations.  Gastrointestinal:  Positive for heartburn.  Genitourinary:  Negative for dysuria.  Musculoskeletal:  Negative for myalgias.  Skin:  Negative for rash.  Neurological:  Negative for dizziness and headaches.  Psychiatric/Behavioral:  Negative for depression and suicidal ideas.   All other systems reviewed and are negative.       Medical History: Past Medical History Past Medical History: Diagnosis Date  GERD (gastroesophageal reflux disease)         Problem List There is no problem list on file for this patient.     Past Surgical History Past Surgical  History: Procedure Laterality Date  APPENDECTOMY          Allergies Allergies Allergen Reactions  Vancomycin Nausea And Vomiting  Lincomycin Nausea     Other reaction(s): nausea and vomiting      Medications Ordered Prior to Encounter Current Outpatient Medications on File Prior to Visit Medication Sig Dispense Refill  famotidine (PEPCID) 20 MG tablet Take 20 mg by mouth at bedtime      RABEprazole (ACIPHEX) 20 mg EC tablet Take 20 mg by mouth 2 (two) times daily        No current facility-administered medications on file prior to visit.      Family History Family History Problem Relation Age of Onset  High blood pressure (Hypertension) Mother    Diabetes Mother        Tobacco Use History Social History    Tobacco Use Smoking Status Former  Types: Cigarettes Smokeless Tobacco Never      Social History Social History    Socioeconomic History  Marital status: Married Tobacco Use  Smoking status: Former     Types: Cigarettes  Smokeless tobacco: Never Substance and Sexual Activity  Alcohol use: Yes  Drug use: Never      Objective:     Vitals:   02/17/23 1326 BP: 127/83 Pulse: 69 Temp: 36.7 C (98 F) SpO2: 98% Weight: (!) 108.9 kg (240 lb) Height: 177.8 cm (5\' 10" )   Body mass index is 34.44 kg/m.   Physical Exam  Constitutional:      General: He is not in acute distress.    Appearance: Normal appearance.  HENT:     Head: Normocephalic.     Nose: No rhinorrhea.     Mouth/Throat:     Mouth: Mucous membranes are moist.     Pharynx: Oropharynx is clear.  Eyes:     General: No scleral icterus.    Pupils: Pupils are equal, round, and reactive to light.  Cardiovascular:     Rate and Rhythm: Normal rate.     Pulses: Normal pulses.  Pulmonary:     Effort: Pulmonary effort is normal. No respiratory distress.     Breath sounds: No stridor. No wheezing.  Abdominal:     General: Abdomen is flat. There is no distension.     Tenderness:  There is no abdominal tenderness. There is no guarding or rebound.  Musculoskeletal:        General: Normal range of motion.     Cervical back: Normal range of motion and neck supple.  Skin:    General: Skin is warm and dry.     Capillary Refill: Capillary refill takes less than 2 seconds.     Coloration: Skin is not jaundiced.  Neurological:     General: No focal deficit present.     Mental Status: He is alert and oriented to person, place, and time. Mental status is at baseline.  Psychiatric:        Mood and Affect: Mood normal.        Thought Content: Thought content normal.        Judgment: Judgment normal.          Assessment and Plan: Diagnoses and all orders for this visit:   Hiatal hernia     Matthew Pace is a 32 y.o. male    1.  We will proceed to the OR for a robotic hiatal hernia repair with mesh and fundoplication 2. All risks and benefits were discussed with the patient, to generally include infection, bleeding, damage to surrounding structures, possible pneumothorax, and recurrence. Alternatives were offered and described.  All questions were answered and the patient voiced understanding of the procedure and wishes to proceed at this point.

## 2023-02-24 ENCOUNTER — Other Ambulatory Visit (HOSPITAL_COMMUNITY): Payer: Self-pay | Admitting: General Surgery

## 2023-02-24 DIAGNOSIS — K449 Diaphragmatic hernia without obstruction or gangrene: Secondary | ICD-10-CM

## 2023-02-25 NOTE — Pre-Procedure Instructions (Signed)
Surgical Instructions   Your procedure is scheduled on March 03, 2023. Report to [facility] Main Entrance "A" at 5:30 A.M., then check in with the Admitting office. Any questions or running late day of surgery: call [phone]  Questions prior to your surgery date: call [phone], Monday-Friday, 8am-4pm. If you experience any cold or flu symptoms such as cough, fever, chills, shortness of breath, etc. between now and your scheduled surgery, please notify us at the above number.     Remember:  Do not eat after midnight the night before your surgery   You may drink clear liquids until 4:30 AM the morning of your surgery.   Clear liquids allowed are: Water, Non-Citrus Juices (without pulp), Carbonated Beverages, Clear Tea (no milk, honey, etc.), Black Coffee Only (NO MILK, CREAM OR POWDERED CREAMER of any kind), and Gatorade.  Patient Instructions  The night before surgery:  No food after midnight. ONLY clear liquids after midnight  The day of surgery (if you do NOT have diabetes):  Drink ONE (1) Pre-Surgery Clear Ensure by 4:30 AM the morning of surgery. Drink in one sitting. Do not sip.  This drink was given to you during your hospital  pre-op appointment visit.  Nothing else to drink after completing the  Pre-Surgery Clear Ensure.         If you have questions, please contact your surgeon's office.     Take these medicines the morning of surgery with A SIP OF WATER: RABEprazole (ACIPHEX)    One week prior to surgery, STOP taking any Aspirin (unless otherwise instructed by your surgeon) Aleve, Naproxen, Ibuprofen, Motrin, Advil, Goody's, BC's, all herbal medications, fish oil, and non-prescription vitamins.                     Do NOT Smoke (Tobacco/Vaping) for 24 hours prior to your procedure.  If you use a CPAP at night, you may bring your mask/headgear for your overnight stay.   You will be asked to remove any contacts, glasses, piercing's, hearing aid's,  dentures/partials prior to surgery. Please bring cases for these items if needed.    Patients discharged the day of surgery will not be allowed to drive home, and someone needs to stay with them for 24 hours.  SURGICAL WAITING ROOM VISITATION Patients may have no more than 2 support people in the waiting area - these visitors may rotate.   Pre-op nurse will coordinate an appropriate time for 1 ADULT support person, who may not rotate, to accompany patient in pre-op.  Children under the age of 91 must have an adult with them who is not the patient and must remain in the main waiting area with an adult.  If the patient needs to stay at the hospital during part of their recovery, the visitor guidelines for inpatient rooms apply.  Please refer to the [facility] website for the visitor guidelines for any additional information.   If you received a COVID test during your pre-op visit  it is requested that you wear a mask when out in public, stay away from anyone that may not be feeling well and notify your surgeon if you develop symptoms. If you have been in contact with anyone that has tested positive in the last 10 days please notify you surgeon.      Pre-operative CHG Bathing Instructions   You can play a key role in reducing the risk of infection after surgery. Your skin needs to be as free of germs  as possible. You can reduce the number of germs on your skin by washing with CHG (chlorhexidine gluconate) soap before surgery. CHG is an antiseptic soap that kills germs and continues to kill germs even after washing.   DO NOT use if you have an allergy to chlorhexidine/CHG or antibacterial soaps. If your skin becomes reddened or irritated, stop using the CHG and notify one of our RNs at [phone].              TAKE A SHOWER THE NIGHT BEFORE SURGERY AND THE DAY OF SURGERY    Please keep in mind the following:  DO NOT shave, including legs and underarms, 48 hours prior to surgery.   You may  shave your face before/day of surgery.  Place clean sheets on your bed the night before surgery Use a clean washcloth (not used since being washed) for each shower. DO NOT sleep with pet's night before surgery.  CHG Shower Instructions:  Wash your face and private area with normal soap. If you choose to wash your hair, wash first with your normal shampoo.  After you use shampoo/soap, rinse your hair and body thoroughly to remove shampoo/soap residue.  Turn the water OFF and apply half the bottle of CHG soap to a CLEAN washcloth.  Apply CHG soap ONLY FROM YOUR NECK DOWN TO YOUR TOES (washing for 3-5 minutes)  DO NOT use CHG soap on face, private areas, open wounds, or sores.  Pay special attention to the area where your surgery is being performed.  If you are having back surgery, having someone wash your back for you may be helpful. Wait 2 minutes after CHG soap is applied, then you may rinse off the CHG soap.  Pat dry with a clean towel  Put on clean pajamas    Additional instructions for the day of surgery: DO NOT APPLY any lotions, deodorants, cologne, or perfumes.   Do not wear jewelry or makeup Do not wear nail polish, gel polish, artificial nails, or any other type of covering on natural nails (fingers and toes) Do not bring valuables to the hospital. [facility] is not responsible for valuables/personal belongings. Put on clean/comfortable clothes.  Please brush your teeth.  Ask your nurse before applying any prescription medications to the skin.

## 2023-02-26 ENCOUNTER — Encounter (HOSPITAL_COMMUNITY)
Admission: RE | Admit: 2023-02-26 | Discharge: 2023-02-26 | Disposition: A | Discharge status: Home / Self Care | Payer: Commercial Managed Care - PPO | Source: Ambulatory Visit | Attending: General Surgery | Admitting: General Surgery

## 2023-02-26 ENCOUNTER — Encounter (HOSPITAL_COMMUNITY): Payer: Self-pay

## 2023-02-26 ENCOUNTER — Other Ambulatory Visit: Payer: Self-pay

## 2023-02-26 ENCOUNTER — Ambulatory Visit (HOSPITAL_COMMUNITY)
Admission: RE | Admit: 2023-02-26 | Discharge: 2023-02-26 | Disposition: A | Discharge status: Home / Self Care | Payer: Commercial Managed Care - PPO | Source: Ambulatory Visit | Attending: General Surgery | Admitting: General Surgery

## 2023-02-26 DIAGNOSIS — Z01812 Encounter for preprocedural laboratory examination: Secondary | ICD-10-CM | POA: Diagnosis present

## 2023-02-26 DIAGNOSIS — Z01818 Encounter for other preprocedural examination: Secondary | ICD-10-CM

## 2023-02-26 DIAGNOSIS — K449 Diaphragmatic hernia without obstruction or gangrene: Secondary | ICD-10-CM

## 2023-02-26 HISTORY — DX: Attention-deficit hyperactivity disorder, unspecified type: F90.9

## 2023-02-26 HISTORY — DX: Personal history of other diseases of the digestive system: Z87.19

## 2023-02-26 LAB — CBC
HCT: 45.8 % (ref 39.0–52.0)
Hemoglobin: 16.1 g/dL (ref 13.0–17.0)
MCH: 29.8 pg (ref 26.0–34.0)
MCHC: 35.2 g/dL (ref 30.0–36.0)
MCV: 84.8 fL (ref 80.0–100.0)
Platelets: 196 10*3/uL (ref 150–400)
RBC: 5.4 MIL/uL (ref 4.22–5.81)
RDW: 12.1 % (ref 11.5–15.5)
WBC: 9.1 10*3/uL (ref 4.0–10.5)
nRBC: 0 % (ref 0.0–0.2)

## 2023-02-26 NOTE — Progress Notes (Signed)
PCP - Dr. Deatra James Cardiologist - Denies  PPM/ICD - Denies Device Orders - n/a Rep Notified - n/a  Chest x-ray - Denies EKG - Denies Stress Test - Denies ECHO - Denies Cardiac Cath - Denies  Sleep Study - Denies CPAP - n/a  No DM  Last dose of GLP1 agonist- n/a GLP1 instructions: n/a  Blood Thinner Instructions: n/a Aspirin Instructions: n/a  ERAS Protcol - Clear liquids until 0430 morning of surgery PRE-SURGERY Ensure or G2- Ensure given to pt with instructions  COVID TEST- n/a   Anesthesia review: No.   Patient denies shortness of breath, fever, cough and chest pain at PAT appointment. Pt denies any respiratory illness/infection in the last two months.   All instructions explained to the patient, with a verbal understanding of the material. Patient agrees to go over the instructions while at home for a better understanding. Patient also instructed to self quarantine after being tested for COVID-19. The opportunity to ask questions was provided.

## 2023-03-03 ENCOUNTER — Other Ambulatory Visit: Payer: Self-pay

## 2023-03-03 ENCOUNTER — Observation Stay (HOSPITAL_COMMUNITY)
Admission: RE | Admit: 2023-03-03 | Discharge: 2023-03-04 | Disposition: A | Discharge status: Home / Self Care | Payer: Commercial Managed Care - PPO | Attending: General Surgery | Admitting: General Surgery

## 2023-03-03 ENCOUNTER — Encounter (HOSPITAL_COMMUNITY)
Admission: RE | Disposition: A | Discharge status: Home / Self Care | Payer: Self-pay | Source: Home / Self Care | Attending: General Surgery

## 2023-03-03 ENCOUNTER — Ambulatory Visit (HOSPITAL_COMMUNITY): Payer: Commercial Managed Care - PPO | Admitting: Vascular Surgery

## 2023-03-03 ENCOUNTER — Encounter (HOSPITAL_COMMUNITY): Payer: Self-pay | Admitting: General Surgery

## 2023-03-03 ENCOUNTER — Ambulatory Visit (HOSPITAL_BASED_OUTPATIENT_CLINIC_OR_DEPARTMENT_OTHER): Payer: Commercial Managed Care - PPO | Admitting: Certified Registered"

## 2023-03-03 DIAGNOSIS — K219 Gastro-esophageal reflux disease without esophagitis: Secondary | ICD-10-CM | POA: Diagnosis not present

## 2023-03-03 DIAGNOSIS — Z87891 Personal history of nicotine dependence: Secondary | ICD-10-CM | POA: Diagnosis not present

## 2023-03-03 DIAGNOSIS — K449 Diaphragmatic hernia without obstruction or gangrene: Secondary | ICD-10-CM | POA: Diagnosis not present

## 2023-03-03 DIAGNOSIS — Z8719 Personal history of other diseases of the digestive system: Secondary | ICD-10-CM

## 2023-03-03 HISTORY — PX: XI ROBOTIC ASSISTED HIATAL HERNIA REPAIR: SHX6889

## 2023-03-03 SURGERY — REPAIR, HERNIA, HIATAL, ROBOT-ASSISTED
Anesthesia: General

## 2023-03-03 MED ORDER — KETAMINE HCL 10 MG/ML IJ SOLN
INTRAMUSCULAR | Status: DC | PRN
  Administered 2023-03-03: 10 mg via INTRAVENOUS
  Administered 2023-03-03: 30 mg via INTRAVENOUS

## 2023-03-03 MED ORDER — CHLORHEXIDINE GLUCONATE 0.12 % MT SOLN: 15.0000 mL | Freq: Once | OROMUCOSAL | Status: AC

## 2023-03-03 MED ORDER — ACETAMINOPHEN 500 MG PO TABS: 1000.0000 mg | ORAL_TABLET | ORAL | Status: AC

## 2023-03-03 MED ORDER — HYDROMORPHONE HCL 1 MG/ML IJ SOLN
INTRAMUSCULAR | Status: AC
  Filled 2023-03-03: qty 0.5

## 2023-03-03 MED ORDER — LIDOCAINE 2% (20 MG/ML) 5 ML SYRINGE
INTRAMUSCULAR | Status: DC | PRN
  Administered 2023-03-03: 80 mg via INTRAVENOUS

## 2023-03-03 MED ORDER — DEXAMETHASONE SODIUM PHOSPHATE 10 MG/ML IJ SOLN
INTRAMUSCULAR | Status: AC
  Filled 2023-03-03: qty 1

## 2023-03-03 MED ORDER — AMISULPRIDE (ANTIEMETIC) 5 MG/2ML IV SOLN: 10.0000 mg | Freq: Once | INTRAVENOUS | Status: DC | PRN

## 2023-03-03 MED ORDER — MIDAZOLAM HCL 2 MG/2ML IJ SOLN
INTRAMUSCULAR | Status: AC
  Filled 2023-03-03: qty 2

## 2023-03-03 MED ORDER — LIDOCAINE 2% (20 MG/ML) 5 ML SYRINGE
INTRAMUSCULAR | Status: AC
  Filled 2023-03-03: qty 5

## 2023-03-03 MED ORDER — SODIUM CHLORIDE (PF) 0.9 % IJ SOLN
INTRAMUSCULAR | Status: DC | PRN
  Administered 2023-03-03: 40 mL

## 2023-03-03 MED ORDER — ONDANSETRON HCL 4 MG/2ML IJ SOLN
INTRAMUSCULAR | Status: DC | PRN
  Administered 2023-03-03: 4 mg via INTRAVENOUS

## 2023-03-03 MED ORDER — CHLORHEXIDINE GLUCONATE CLOTH 2 % EX PADS: 6.0000 | MEDICATED_PAD | Freq: Once | CUTANEOUS | Status: DC

## 2023-03-03 MED ORDER — DEXAMETHASONE SODIUM PHOSPHATE 10 MG/ML IJ SOLN
INTRAMUSCULAR | Status: DC | PRN
  Administered 2023-03-03: 10 mg via INTRAVENOUS

## 2023-03-03 MED ORDER — ONDANSETRON 4 MG PO TBDP: 4.0000 mg | ORAL_TABLET | Freq: Four times a day (QID) | ORAL | Status: DC | PRN

## 2023-03-03 MED ORDER — CHLORHEXIDINE GLUCONATE 0.12 % MT SOLN
OROMUCOSAL | Status: AC
  Administered 2023-03-03: 15 mL via OROMUCOSAL
  Filled 2023-03-03: qty 15

## 2023-03-03 MED ORDER — KETAMINE HCL 50 MG/5ML IJ SOSY
PREFILLED_SYRINGE | INTRAMUSCULAR | Status: AC
  Filled 2023-03-03: qty 5

## 2023-03-03 MED ORDER — DEXTROSE-SODIUM CHLORIDE 5-0.9 % IV SOLN: INTRAVENOUS | Status: AC

## 2023-03-03 MED ORDER — METOPROLOL TARTRATE 5 MG/5ML IV SOLN
INTRAVENOUS | Status: DC | PRN
  Administered 2023-03-03: 2 mg via INTRAVENOUS

## 2023-03-03 MED ORDER — PROPOFOL 10 MG/ML IV BOLUS
INTRAVENOUS | Status: AC
  Filled 2023-03-03: qty 20

## 2023-03-03 MED ORDER — FENTANYL CITRATE (PF) 250 MCG/5ML IJ SOLN
INTRAMUSCULAR | Status: AC
  Filled 2023-03-03: qty 5

## 2023-03-03 MED ORDER — EPHEDRINE 5 MG/ML INJ
INTRAVENOUS | Status: AC
  Filled 2023-03-03: qty 5

## 2023-03-03 MED ORDER — CEFAZOLIN SODIUM-DEXTROSE 2-4 GM/100ML-% IV SOLN
2.0000 g | INTRAVENOUS | Status: AC
  Administered 2023-03-03: 2 g via INTRAVENOUS

## 2023-03-03 MED ORDER — ALBUMIN HUMAN 5 % IV SOLN: INTRAVENOUS | Status: DC | PRN

## 2023-03-03 MED ORDER — BUPIVACAINE HCL (PF) 0.25 % IJ SOLN
INTRAMUSCULAR | Status: AC
  Filled 2023-03-03: qty 30

## 2023-03-03 MED ORDER — FENTANYL CITRATE (PF) 100 MCG/2ML IJ SOLN
INTRAMUSCULAR | Status: AC
  Filled 2023-03-03: qty 2

## 2023-03-03 MED ORDER — BUPIVACAINE HCL (PF) 0.25 % IJ SOLN
INTRAMUSCULAR | Status: DC | PRN
  Administered 2023-03-03: 16 mL

## 2023-03-03 MED ORDER — ROCURONIUM BROMIDE 10 MG/ML (PF) SYRINGE
PREFILLED_SYRINGE | INTRAVENOUS | Status: AC
  Filled 2023-03-03: qty 10

## 2023-03-03 MED ORDER — SUGAMMADEX SODIUM 200 MG/2ML IV SOLN
INTRAVENOUS | Status: DC | PRN
  Administered 2023-03-03: 200 mg via INTRAVENOUS

## 2023-03-03 MED ORDER — LACTATED RINGERS IV SOLN: INTRAVENOUS | Status: DC

## 2023-03-03 MED ORDER — FENTANYL CITRATE (PF) 100 MCG/2ML IJ SOLN
25.0000 ug | INTRAMUSCULAR | Status: DC | PRN
  Administered 2023-03-03: 50 ug via INTRAVENOUS

## 2023-03-03 MED ORDER — HYDROMORPHONE HCL 1 MG/ML IJ SOLN
INTRAMUSCULAR | Status: DC | PRN
  Administered 2023-03-03: .5 mg via INTRAVENOUS

## 2023-03-03 MED ORDER — MIDAZOLAM HCL 2 MG/2ML IJ SOLN
INTRAMUSCULAR | Status: DC | PRN
  Administered 2023-03-03: 2 mg via INTRAVENOUS

## 2023-03-03 MED ORDER — PHENYLEPHRINE 80 MCG/ML (10ML) SYRINGE FOR IV PUSH (FOR BLOOD PRESSURE SUPPORT)
PREFILLED_SYRINGE | INTRAVENOUS | Status: AC
  Filled 2023-03-03: qty 10

## 2023-03-03 MED ORDER — ONDANSETRON HCL 4 MG/2ML IJ SOLN
INTRAMUSCULAR | Status: AC
  Filled 2023-03-03: qty 2

## 2023-03-03 MED ORDER — ONDANSETRON HCL 4 MG/2ML IJ SOLN
4.0000 mg | Freq: Four times a day (QID) | INTRAMUSCULAR | Status: DC | PRN
Start: 2023-03-03 — End: 2023-03-04

## 2023-03-03 MED ORDER — ORAL CARE MOUTH RINSE: 15.0000 mL | Freq: Once | OROMUCOSAL | Status: AC

## 2023-03-03 MED ORDER — CHLORHEXIDINE GLUCONATE CLOTH 2 % EX PADS
6.0000 | MEDICATED_PAD | Freq: Once | CUTANEOUS | Status: DC
Start: 2023-03-03 — End: 2023-03-03

## 2023-03-03 MED ORDER — CEFAZOLIN SODIUM-DEXTROSE 2-4 GM/100ML-% IV SOLN
INTRAVENOUS | Status: AC
  Filled 2023-03-03: qty 100

## 2023-03-03 MED ORDER — FENTANYL CITRATE (PF) 250 MCG/5ML IJ SOLN
INTRAMUSCULAR | Status: DC | PRN
  Administered 2023-03-03: 100 ug via INTRAVENOUS
  Administered 2023-03-03: 150 ug via INTRAVENOUS

## 2023-03-03 MED ORDER — ESMOLOL HCL 100 MG/10ML IV SOLN
INTRAVENOUS | Status: DC | PRN
  Administered 2023-03-03: 10 mg via INTRAVENOUS
  Administered 2023-03-03: 20 mg via INTRAVENOUS

## 2023-03-03 MED ORDER — ACETAMINOPHEN 500 MG PO TABS
ORAL_TABLET | ORAL | Status: AC
  Administered 2023-03-03: 1000 mg via ORAL
  Filled 2023-03-03: qty 2

## 2023-03-03 MED ORDER — ENSURE PRE-SURGERY PO LIQD: 296.0000 mL | Freq: Once | ORAL | Status: DC

## 2023-03-03 MED ORDER — ROCURONIUM BROMIDE 10 MG/ML (PF) SYRINGE
PREFILLED_SYRINGE | INTRAVENOUS | Status: DC | PRN
  Administered 2023-03-03: 40 mg via INTRAVENOUS
  Administered 2023-03-03: 60 mg via INTRAVENOUS

## 2023-03-03 MED ORDER — KETOROLAC TROMETHAMINE 15 MG/ML IJ SOLN
15.0000 mg | Freq: Four times a day (QID) | INTRAMUSCULAR | Status: DC | PRN
  Administered 2023-03-03 – 2023-03-04 (×3): 15 mg via INTRAVENOUS
  Filled 2023-03-03 (×3): qty 1

## 2023-03-03 MED ORDER — BUPIVACAINE LIPOSOME 1.3 % IJ SUSP
INTRAMUSCULAR | Status: AC
  Filled 2023-03-03: qty 20

## 2023-03-03 MED ORDER — PROPOFOL 10 MG/ML IV BOLUS
INTRAVENOUS | Status: DC | PRN
  Administered 2023-03-03: 250 mg via INTRAVENOUS

## 2023-03-03 SURGICAL SUPPLY — 53 items
CANNULA REDUCER 12-8 DVNC XI (CANNULA) ×1 IMPLANT
CHLORAPREP W/TINT 26 (MISCELLANEOUS) ×1 IMPLANT
COVER MAYO STAND STRL (DRAPES) ×1 IMPLANT
COVER SURGICAL LIGHT HANDLE (MISCELLANEOUS) ×1 IMPLANT
COVER TIP SHEARS 8 DVNC (MISCELLANEOUS) IMPLANT
DEFOGGER SCOPE WARMER CLEARIFY (MISCELLANEOUS) ×1 IMPLANT
DERMABOND ADVANCED .7 DNX12 (GAUZE/BANDAGES/DRESSINGS) ×1 IMPLANT
DEVICE TROCAR PUNCTURE CLOSURE (ENDOMECHANICALS) ×1 IMPLANT
DRAIN PENROSE 0.5X18 (DRAIN) IMPLANT
DRAPE ARM DVNC X/XI (DISPOSABLE) ×4 IMPLANT
DRAPE CARDIOVASC SPLIT 88X140 (DRAPES) ×1 IMPLANT
DRAPE COLUMN DVNC XI (DISPOSABLE) ×1 IMPLANT
DRAPE SURG ORHT 6 SPLT 77X108 (DRAPES) ×1 IMPLANT
DRIVER NDL MEGA SUTCUT DVNCXI (INSTRUMENTS) ×1 IMPLANT
DRIVER NDLE MEGA SUTCUT DVNCXI (INSTRUMENTS) ×1 IMPLANT
ELECT REM PT RETURN 9FT ADLT (ELECTROSURGICAL) ×1 IMPLANT
ELECTRODE REM PT RTRN 9FT ADLT (ELECTROSURGICAL) ×1 IMPLANT
FORCEPS BPLR FENES DVNC XI (FORCEP) ×1 IMPLANT
GLOVE BIO SURGEON STRL SZ7.5 (GLOVE) ×5 IMPLANT
GOWN STRL REUS W/ TWL LRG LVL3 (GOWN DISPOSABLE) ×1 IMPLANT
GOWN STRL REUS W/ TWL XL LVL3 (GOWN DISPOSABLE) ×2 IMPLANT
GOWN STRL REUS W/TWL 2XL LVL3 (GOWN DISPOSABLE) ×1 IMPLANT
IRRIG SUCT STRYKERFLOW 2 WTIP (MISCELLANEOUS) ×1 IMPLANT
IRRIGATION SUCT STRKRFLW 2 WTP (MISCELLANEOUS) ×1 IMPLANT
KIT BASIN OR (CUSTOM PROCEDURE TRAY) ×1 IMPLANT
KIT TURNOVER KIT B (KITS) IMPLANT
MARKER SKIN DUAL TIP RULER LAB (MISCELLANEOUS) ×1 IMPLANT
MESH BIO-A 7X10 SYN MAT (Mesh General) ×1 IMPLANT
NDL 22X1.5 STRL (OR ONLY) (MISCELLANEOUS) ×1 IMPLANT
NDL INSUFFLATION 14GA 120MM (NEEDLE) ×1 IMPLANT
NEEDLE 22X1.5 STRL (OR ONLY) (MISCELLANEOUS) ×1 IMPLANT
NEEDLE INSUFFLATION 14GA 120MM (NEEDLE) ×1 IMPLANT
OBTURATOR OPTICAL STND 8 DVNC (TROCAR) IMPLANT
OBTURATOR OPTICALSTD 8 DVNC (TROCAR) IMPLANT
PENCIL SMOKE EVACUATOR (MISCELLANEOUS) IMPLANT
RETRACTOR GRSP SML 8 DVNC XI (INSTRUMENTS) ×1 IMPLANT
SCISSORS LAP 5X35 DISP (ENDOMECHANICALS) IMPLANT
SCISSORS MNPLR CVD DVNC XI (INSTRUMENTS) IMPLANT
SEAL UNIV 5-12 XI (MISCELLANEOUS) ×4 IMPLANT
SEALER VESSEL EXT DVNC XI (MISCELLANEOUS) ×1 IMPLANT
SET TUBE SMOKE EVAC HIGH FLOW (TUBING) ×1 IMPLANT
SPIKE FLUID TRANSFER (MISCELLANEOUS) ×1 IMPLANT
STAPLER VISISTAT 35W (STAPLE) IMPLANT
STOPCOCK 4 WAY LG BORE MALE ST (IV SETS) ×1 IMPLANT
SUT ETHIBOND 0 36 GRN (SUTURE) ×2 IMPLANT
SUT ETHIBOND 2 0 SH 36X2 (SUTURE) IMPLANT
SUT MNCRL AB 4-0 PS2 18 (SUTURE) ×1 IMPLANT
SUT SILK 0 SH 30 (SUTURE) ×1 IMPLANT
SUT VICRYL 0 UR6 27IN ABS (SUTURE) ×1 IMPLANT
SYR 30ML SLIP (SYRINGE) ×1 IMPLANT
TRAY FOLEY MTR SLVR 16FR STAT (SET/KITS/TRAYS/PACK) ×1 IMPLANT
TRAY LAPAROSCOPIC MC (CUSTOM PROCEDURE TRAY) ×1 IMPLANT
TROCAR ADV FIXATION 5X100MM (TROCAR) ×1 IMPLANT

## 2023-03-03 NOTE — Op Note (Signed)
03/03/2023  9:05 AM  PATIENT:  Matthew Pace  32 y.o. male  PRE-OPERATIVE DIAGNOSIS:  HIATAL HERNIA / GERD  POST-OPERATIVE DIAGNOSIS:  HIATAL HERNIA / GERD  PROCEDURE:  Procedure(s): XI ROBOTIC ASSISTED HIATAL HERNIA REPAIR WITH MESH AND FUNDOPUCATION (N/A)  SURGEON:  Surgeons and Role:    Axel Filler, MD - Primary  ASSISTANTS: Jeronimo Greaves, RNFA   ANESTHESIA:   local and general  EBL:  10 mL   BLOOD ADMINISTERED:none  DRAINS: none   LOCAL MEDICATIONS USED:  BUPIVICAINE  and OTHER exparel  SPECIMEN:  No Specimen  DISPOSITION OF SPECIMEN:  N/A  COUNTS:  YES  TOURNIQUET:  * No tourniquets in log *  DICTATION: .Dragon Dictation   Findings:Patient with a type 1 hiatal hernia.  Toupet fundoplication was used to wrap the esophagus.  Gore BioA mesh was used to reinforce the hiatal closure.  Details of the procedure:  The patient was taken back to the operating room and placed in the supine position with bilateral SCDs in place. The patient was prepped and draped in the usual sterile fashion. After appropriate antibiotics were confirmed a timeout was called and all facts were verified.   A Veress needle technique was used to insufflate the abdomen to 15 mm of mercury the paramedian stab incision. Subsequent to this an 8 mm trocar was introduced as was a 8 millimeter camera. At this time the subsequent robotic trochars x3, were then placed adjacent to this trocar approximately 8-10 cm away. Each trocar was inserted under direct visualization, there were total of 4 trochars. A 12mm trocar was placed in the midclavicular line.  A 0vicryl was placed to help with closure at the end of the case.  Another was placed at the 8mm supraumbilical port site.The assistant trocar was then placed in the right lower quadrant under direct visualization. The Nathanson retractor was then visualized inserted into the abdomen and the incision just to the left of the falciform ligament. This  was then placed to retract the liver appropriately. At this time the patient was positioned in reverse Trendelenburg.   At this time the robot patient cart was brought to the bedside and placed in good position and the arms were docked to the trochars appropriately. At this time I proceeded to incised the gastrohepatic ligament.  At this time I proceeded to mobilize the stomach inferiorly and visualize the right crus. The peritoneum over the right crus was incised and right crus was identified. I proceeded to dissect this inferiorly until the left crus was seen joining the right crus. Once the right crus was adequately dissected we turned our to the left crus which was dissected away. This required traction of the stomach to the right side. Once this was visualized we then proceeded to circumferentially dissect the esophagus away from the surrounding tissue. The anterior and posterior vagus was seen along the esophagus at the GE junction.  These were both preserved throughout the entire case.  At this time the phrenoesophageal fat pad was dissected away from the esophagus. There was a moderate-sized hiatal hernia seen. I mobilized the esophagus cephalad approximately 4-5 cm, clearing away the surrounding tissue.  At this time we turned our attention to the greater curvature the stomach and the omentum was mobilized using the robotic vessel sealer. This was taken up to the greater curvature to the hiatus. This mobilized the entire greater curvature to allow mobilization and the wrap. I then proceeded to bring the greater curvature the  stomach posterior to the esophagus, and a shoeshine technique was used to evaluate the mobilization of the greater curvature.   At this time I proceeded to close the hiatus using interrupted 0 Ethibonds x 3 in a figure of 8 fashion. This brought together the hiatal closure without undue stricture to the esophagus.   A piece of Gore Bio A hiatal mesh was placed over the hiatal  closure and sutured to the crus using 0 Ethibonds sutures x 4.  At this time the greater curvature was brought around the esophagus and sutured using 0 silk sutures interrupted fashion approximately 1 cm apart x3 on each side of the esophagus in a Toupet fashion. A left collar stitch was then used to gastropexy the stomach from the wrap to the diaphragm just lateral to the left crus as.  A second collar stitch was placed from the wrap to the right crus.  The wrap lay loose with no strangulation of the esophagus.  At this time the robot was undocked. The liver trocar was removed. At this time insufflation was evacuated. Skin was reapproximated for Monocryl subcuticular fashion. The skin was then dressed with Dermabond. The patient tolerated the procedure well and was taken to the recovery room in stable condition.    PLAN OF CARE: Admit for overnight observation  PATIENT DISPOSITION:  PACU - hemodynamically stable.   Delay start of Pharmacological VTE agent (>24hrs) due to surgical blood loss or risk of bleeding: not applicable

## 2023-03-03 NOTE — Progress Notes (Signed)
Pt complained of headache and asked if he could have something else for headache. Msg Dr. Axel Filler and per MD I can give pt ketorolac IV for pain, but nothing by mouth and only clear liquids. I informed pt that he can only have clears and no po meds. Also that I was giving  him another dose of ketorolac now per MD, which was approx 1 hr early, but that he could get it every 6 hours after that as needed Pt verbalized that he understands.

## 2023-03-03 NOTE — Discharge Instructions (Signed)

## 2023-03-03 NOTE — Transfer of Care (Signed)
Immediate Anesthesia Transfer of Care Note  Patient: Matthew Pace  Procedure(s) Performed: XI ROBOTIC ASSISTED HIATAL HERNIA REPAIR WITH MESH AND FUNDOPUCATION  Patient Location: PACU  Anesthesia Type:General  Level of Consciousness: sedated  Airway & Oxygen Therapy: Patient Spontanous Breathing and Patient connected to face mask oxygen  Post-op Assessment: Report given to RN and Post -op Vital signs reviewed and stable  Post vital signs: Reviewed and stable  Last Vitals:  Vitals Value Taken Time  BP 136/85 03/03/23 0930  Temp 36.2 C 03/03/23 0930  Pulse 87 03/03/23 0930  Resp 20 03/03/23 0930  SpO2 98 % 03/03/23 0930  Vitals shown include unfiled device data.  Last Pain:  Vitals:   03/03/23 0549  TempSrc:   PainSc: 0-No pain      Patients Stated Pain Goal: 0 (03/03/23 0549)  Complications: No notable events documented.

## 2023-03-03 NOTE — Anesthesia Preprocedure Evaluation (Addendum)
Anesthesia Evaluation  Patient identified by MRN, date of birth, ID band Patient awake    Reviewed: Allergy & Precautions, NPO status , Patient's Chart, lab work & pertinent test results  Airway Mallampati: IV  TM Distance: >3 FB Neck ROM: Full    Dental  (+) Dental Advisory Given   Pulmonary former smoker   breath sounds clear to auscultation       Cardiovascular negative cardio ROS  Rhythm:Regular Rate:Normal     Neuro/Psych negative neurological ROS     GI/Hepatic Neg liver ROS, hiatal hernia,GERD  ,,  Endo/Other  negative endocrine ROS    Renal/GU negative Renal ROS     Musculoskeletal   Abdominal   Peds  Hematology negative hematology ROS (+)   Anesthesia Other Findings   Reproductive/Obstetrics                             Anesthesia Physical Anesthesia Plan  ASA: 2  Anesthesia Plan: General   Post-op Pain Management: Tylenol PO (pre-op)* and Ketamine IV*   Induction: Intravenous  PONV Risk Score and Plan: 2 and Dexamethasone, Ondansetron and Treatment may vary due to age or medical condition  Airway Management Planned: Oral ETT and Video Laryngoscope Planned  Additional Equipment:   Intra-op Plan:   Post-operative Plan: Extubation in OR  Informed Consent: I have reviewed the patients History and Physical, chart, labs and discussed the procedure including the risks, benefits and alternatives for the proposed anesthesia with the patient or authorized representative who has indicated his/her understanding and acceptance.     Dental advisory given  Plan Discussed with: CRNA  Anesthesia Plan Comments:        Anesthesia Quick Evaluation

## 2023-03-03 NOTE — Anesthesia Procedure Notes (Signed)
Procedure Name: Intubation Date/Time: 03/03/2023 7:45 AM  Performed by: Alwyn Ren, CRNAPre-anesthesia Checklist: Patient identified, Emergency Drugs available, Suction available and Patient being monitored Patient Re-evaluated:Patient Re-evaluated prior to induction Oxygen Delivery Method: Circle system utilized Preoxygenation: Pre-oxygenation with 100% oxygen Induction Type: IV induction Ventilation: Mask ventilation without difficulty Laryngoscope Size: Glidescope and 4 Grade View: Grade I Tube type: Oral Tube size: 7.5 mm Number of attempts: 1 Airway Equipment and Method: Oral airway and Rigid stylet Placement Confirmation: ETT inserted through vocal cords under direct vision, positive ETCO2 and breath sounds checked- equal and bilateral Secured at: 2 cm Tube secured with: Tape Dental Injury: Teeth and Oropharynx as per pre-operative assessment

## 2023-03-03 NOTE — Interval H&P Note (Signed)
History and Physical Interval Note:  03/03/2023 7:25 AM  Matthew Pace  has presented today for surgery, with the diagnosis of HIATAL HERNIA / GERD.  The various methods of treatment have been discussed with the patient and family. After consideration of risks, benefits and other options for treatment, the patient has consented to  Procedure(s): XI ROBOTIC ASSISTED HIATAL HERNIA REPAIR WITH MESH AND FUNDOPUCATION (N/A) as a surgical intervention.  The patient's history has been reviewed, patient examined, no change in status, stable for surgery.  I have reviewed the patient's chart and labs.  Questions were answered to the patient's satisfaction.     Axel Filler

## 2023-03-04 ENCOUNTER — Encounter (HOSPITAL_COMMUNITY): Payer: Self-pay | Admitting: General Surgery

## 2023-03-04 ENCOUNTER — Observation Stay (HOSPITAL_COMMUNITY): Payer: Commercial Managed Care - PPO

## 2023-03-04 DIAGNOSIS — K449 Diaphragmatic hernia without obstruction or gangrene: Secondary | ICD-10-CM | POA: Diagnosis not present

## 2023-03-04 MED ORDER — HYDROCODONE-ACETAMINOPHEN 7.5-325 MG/15ML PO SOLN: 15.0000 mL | Freq: Four times a day (QID) | ORAL | 0 refills | Status: AC | PRN

## 2023-03-04 MED ORDER — IOHEXOL 300 MG/ML  SOLN
60.0000 mL | Freq: Once | INTRAMUSCULAR | Status: AC | PRN
  Administered 2023-03-04: 60 mL via ORAL

## 2023-03-04 NOTE — Discharge Summary (Signed)
Patient ID: Matthew Pace [id number] Jan 11, 1991 31 y.o.  Admit date: 03/03/2023 Discharge date: 03/04/2023  Admitting Diagnosis: Hiatal hernia   Discharge Diagnosis Hiatal hernia s/p Robotic assisted hiatal hernia repair with mesh and fundoplication   Consultants None  H&P Matthew Pace is a 32 y.o. male who is seen today as an office consultation at the request of Dr. Meridee Score for evaluation of New Consultation (Hiatal hernia) .     Patient is a 32 year old male who comes in secondary to hiatal hernia, reflux.  He states he has been dealing with severe reflux over the last 5 years.  He states that he has had several prescriptions for medications that he maximizes and become ineffective.  He states that he has been taking steps to minimize the reflux by sleeping on a wedge, decreasing p.o. intake after certain time.  He states that he has had no significant pulmonary infections.   Patient has undergone endoscopy which revealed a 5 cm hiatal hernia.  He had no CT imaging.   He had a previous lap appendectomy in 2021.    Procedures Dr. Derrell Lolling - 03/03/23 - XI ROBOTIC ASSISTED HIATAL HERNIA REPAIR WITH MESH AND FUNDOPUCATION (N/A)   Hospital Course:  Patient was admitted for [facility] repair and underwent above procedure. On POD 1 patient underwent esophagram that showed    1. No evidence for an esophageal leak or contrast extravasation. 2. Contrast pools in a dependent portion of the proximal stomach that appears to be adjacent to the fundoplication wrap. Contrast moved through the stomach into the small bowel and colon on a follow-up KUB. 3. Mild distention of the distal esophagus with contrast refluxing from the distal esophagus into the mid esophagus.   Diet was advanced CLD > pureed. Patient tolerated diet advancement. On POD1, the patient was voiding well, tolerating diet, ambulating well, pain well controlled, vital signs stable, incisions c/d/i and felt stable for  discharge home. Discussed discharge instructions, restrictions and return/call back precautions. Follow up as noted below.   Physical Exam: Gen:  Alert, NAD, pleasant Card:  RRR Pulm:  CTAB, no W/R/R, effort normal Abd: Soft, ND, appropriately tender around laparoscopic incisions, no rigidity or guarding and otherwise NT, +BS. Incisions with glue intact appears well and are without drainage, bleeding, or signs of infection  Psych: A&Ox3    Allergies as of 03/04/2023       Reactions   Vancomycin Nausea And Vomiting   Lincomycin Nausea And Vomiting        Medication List     STOP taking these medications    famotidine 20 MG tablet Commonly known as: PEPCID   RABEprazole 20 MG tablet Commonly known as: ACIPHEX       TAKE these medications    calcium carbonate 500 MG chewable tablet Commonly known as: TUMS - dosed in mg elemental calcium Chew 2 tablets by mouth daily as needed for indigestion or heartburn.   HYDROcodone-acetaminophen 7.5-325 mg/15 ml solution Commonly known as: HYCET Take 15 mLs by mouth 4 (four) times daily as needed for moderate pain (pain score 4-6).   ibuprofen 200 MG tablet Commonly known as: ADVIL Take 200-400 mg by mouth every 6 (six) hours as needed for moderate pain (pain score 4-6).          Follow-up Information     Axel Filler, MD. Schedule an appointment as soon as possible for a visit in 2 week(s).   Specialty: General Surgery Why: Post op visit Contact information:  [address] [place] [address] [phone]                 Signed: Leary Roca, [facility] Surgery 03/04/2023, 3:54 PM Please see Amion for pager number during day hours 7:00am-4:30pm

## 2023-03-04 NOTE — Progress Notes (Signed)
1 Day Post-Op   Subjective/Chief Complaint: Patient doing well.  Appropriate soreness.  Tolerating clear liquids.   Objective: Vital signs in last 24 hours: Temp:  [97.2 F (36.2 C)-98.5 F (36.9 C)] 98 F (36.7 C) (12/18 0338) Pulse Rate:  [76-98] 80 (12/18 0338) Resp:  [15-20] 15 (12/18 0338) BP: (118-144)/(68-92) 126/68 (12/18 0338) SpO2:  [90 %-99 %] 97 % (12/18 0338) Weight:  [111.1 kg] 111.1 kg (12/17 0543)    Intake/Output from previous day: 12/17 0701 - 12/18 0700 In: 1478.5 [I.V.:1228.5; IV Piggyback:250] Out: 10 [Blood:10] Intake/Output this shift: Total I/O In: 428.5 [I.V.:428.5] Out: -   General appearance: alert and cooperative GI: soft, non-tender; bowel sounds normal; no masses,  no organomegaly and incision clean dry and intact.  Lab Results:  No results for input(s): "WBC", "HGB", "HCT", "PLT" in the last 72 hours. BMET No results for input(s): "NA", "K", "CL", "CO2", "GLUCOSE", "BUN", "CREATININE", "CALCIUM" in the last 72 hours. PT/INR No results for input(s): "LABPROT", "INR" in the last 72 hours. ABG No results for input(s): "PHART", "HCO3" in the last 72 hours.  Invalid input(s): "PCO2", "PO2"  Studies/Results: No results found.  Anti-infectives: Anti-infectives (From admission, onward)    Start     Dose/Rate Route Frequency Ordered Stop   03/03/23 0600  ceFAZolin (ANCEF) IVPB 2g/100 mL premix        2 g 200 mL/hr over 30 Minutes Intravenous On call to O.R. 03/03/23 0544 03/03/23 0745   03/03/23 0546  ceFAZolin (ANCEF) 2-4 GM/100ML-% IVPB       Note to Pharmacy: Amanda Pea M: cabinet override      03/03/23 0546 03/03/23 0802       Assessment/Plan: s/p Procedure(s): XI ROBOTIC ASSISTED HIATAL HERNIA REPAIR WITH MESH AND FUNDOPUCATION (N/A) Postop day 1. 1.  Will await esophagram to ensure no esophageal injury.  As long as that is normal without leak okay to proceed with a  pured diet. 2.  If patient able to ambulate well and  tolerate p.o.  Okay for home later today.  LOS: 0 days    Axel Filler 03/04/2023

## 2023-03-04 NOTE — Plan of Care (Signed)
  Problem: Education: Goal: Knowledge of General Education information will improve Description: Including pain rating scale, medication(s)/side effects and non-pharmacologic comfort measures Outcome: Progressing   Problem: Health Behavior/Discharge Planning: Goal: Ability to manage health-related needs will improve Outcome: Progressing   Problem: Clinical Measurements: Goal: Will remain free from infection Outcome: Progressing   Problem: Nutrition: Goal: Adequate nutrition will be maintained Outcome: Progressing   Problem: Elimination: Goal: Will not experience complications related to urinary retention Outcome: Progressing   Problem: Pain Management: Goal: General experience of comfort will improve Outcome: Progressing   Problem: Safety: Goal: Ability to remain free from injury will improve Outcome: Progressing   Problem: Skin Integrity: Goal: Risk for impaired skin integrity will decrease Outcome: Progressing

## 2023-03-04 NOTE — Plan of Care (Signed)

## 2023-03-04 NOTE — Plan of Care (Signed)
RD consulted for nutrition education regarding diet instructions s/p Nissen (pureed diet for 2 weeks post-op).    Met with patient and his significant other at bedside. Prior to surgery, patient had been sleeping on a wedge pillow, eating earlier in the evening, and avoiding pork to manage his symptoms.   RD provided [facility] Nissen Post-Op Puree Diet Handout. Reviewed patient's dietary recall. Provided examples on ways to modify foods on pureed diet. Encouraged pt to blend foods using a blender or food processor and strain to obtain desired consistency (no lumps). Also encouraged use of commercial pureed foods and oral nutritional supplements such as Ensure to provide adequate calories and protein. Discussed how to modify recipes to add calories and protein.    RD discussed why it is important for patient to adhere to diet recommendations and discussed possibility of diet advancement upon MD follow-up. Teach back method used.   Expect good compliance.   Current diet order is Dysphagia 1, thin liquids.Patient is consuming approximately 50% of meals at this time. Labs and medications reviewed. No further nutrition interventions warranted at this time. RD contact information provided. If additional nutrition issues arise, please re-consult RD.    Myrtie Cruise MS, RD, LDN Registered Dietitian Clinical Nutrition RD Inpatient Contact Info in Amion

## 2023-03-04 NOTE — Plan of Care (Signed)
  Problem: Education: Goal: Knowledge of General Education information will improve Description: Including pain rating scale, medication(s)/side effects and non-pharmacologic comfort measures 03/04/2023 1552 by Letta Moynahan, RN Outcome: Adequate for Discharge 03/04/2023 1021 by Letta Moynahan, RN Outcome: Progressing   Problem: Health Behavior/Discharge Planning: Goal: Ability to manage health-related needs will improve 03/04/2023 1552 by Letta Moynahan, RN Outcome: Adequate for Discharge 03/04/2023 1021 by Letta Moynahan, RN Outcome: Progressing   Problem: Clinical Measurements: Goal: Ability to maintain clinical measurements within normal limits will improve 03/04/2023 1552 by Letta Moynahan, RN Outcome: Adequate for Discharge 03/04/2023 1021 by Letta Moynahan, RN Outcome: Progressing Goal: Will remain free from infection 03/04/2023 1552 by Letta Moynahan, RN Outcome: Adequate for Discharge 03/04/2023 1021 by Letta Moynahan, RN Outcome: Progressing Goal: Diagnostic test results will improve 03/04/2023 1552 by Letta Moynahan, RN Outcome: Adequate for Discharge 03/04/2023 1021 by Letta Moynahan, RN Outcome: Progressing Goal: Respiratory complications will improve 03/04/2023 1552 by Letta Moynahan, RN Outcome: Adequate for Discharge 03/04/2023 1021 by Letta Moynahan, RN Outcome: Progressing Goal: Cardiovascular complication will be avoided 03/04/2023 1552 by Letta Moynahan, RN Outcome: Adequate for Discharge 03/04/2023 1021 by Letta Moynahan, RN Outcome: Progressing   Problem: Activity: Goal: Risk for activity intolerance will decrease 03/04/2023 1552 by Letta Moynahan, RN Outcome: Adequate for Discharge 03/04/2023 1021 by Letta Moynahan, RN Outcome: Progressing   Problem: Nutrition: Goal: Adequate nutrition will be maintained 03/04/2023 1552 by Letta Moynahan, RN Outcome: Adequate for Discharge 03/04/2023 1021 by Letta Moynahan, RN Outcome: Progressing    Problem: Coping: Goal: Level of anxiety will decrease 03/04/2023 1552 by Letta Moynahan, RN Outcome: Adequate for Discharge 03/04/2023 1021 by Letta Moynahan, RN Outcome: Progressing   Problem: Elimination: Goal: Will not experience complications related to bowel motility 03/04/2023 1552 by Letta Moynahan, RN Outcome: Adequate for Discharge 03/04/2023 1021 by Letta Moynahan, RN Outcome: Progressing Goal: Will not experience complications related to urinary retention 03/04/2023 1552 by Letta Moynahan, RN Outcome: Adequate for Discharge 03/04/2023 1021 by Letta Moynahan, RN Outcome: Progressing   Problem: Pain Management: Goal: General experience of comfort will improve 03/04/2023 1552 by Letta Moynahan, RN Outcome: Adequate for Discharge 03/04/2023 1021 by Letta Moynahan, RN Outcome: Progressing   Problem: Safety: Goal: Ability to remain free from injury will improve 03/04/2023 1552 by Letta Moynahan, RN Outcome: Adequate for Discharge 03/04/2023 1021 by Letta Moynahan, RN Outcome: Progressing   Problem: Skin Integrity: Goal: Risk for impaired skin integrity will decrease 03/04/2023 1552 by Letta Moynahan, RN Outcome: Adequate for Discharge 03/04/2023 1021 by Letta Moynahan, RN Outcome: Progressing

## 2023-03-04 NOTE — Anesthesia Postprocedure Evaluation (Signed)
Anesthesia Post Note  Patient: Matthew Pace  Procedure(s) Performed: XI ROBOTIC ASSISTED HIATAL HERNIA REPAIR WITH MESH AND FUNDOPUCATION     Patient location during evaluation: PACU Anesthesia Type: General Level of consciousness: awake and alert Pain management: pain level controlled Vital Signs Assessment: post-procedure vital signs reviewed and stable Respiratory status: spontaneous breathing, nonlabored ventilation, respiratory function stable and patient connected to nasal cannula oxygen Cardiovascular status: blood pressure returned to baseline and stable Postop Assessment: no apparent nausea or vomiting Anesthetic complications: no   No notable events documented.  Last Vitals:  Vitals:   03/04/23 1228 03/04/23 1230  BP: 129/83 129/83  Pulse: 85 96  Resp: 16   Temp: 37 C 37 C  SpO2: 97% 97%    Last Pain:  Vitals:   03/04/23 1230  TempSrc: Oral  PainSc:                  Kennieth Rad
